# Patient Record
Sex: Female | Born: 1950 | Race: White | Hispanic: No | Marital: Married | Smoking: Never smoker
Health system: Southern US, Community
[De-identification: ages and names within clinical notes are randomized; demographics above are authoritative.]

## PROBLEM LIST (undated history)

## (undated) DIAGNOSIS — Z7189 Other specified counseling: Principal | ICD-10-CM

## (undated) DIAGNOSIS — M1990 Unspecified osteoarthritis, unspecified site: Secondary | ICD-10-CM

## (undated) DIAGNOSIS — J069 Acute upper respiratory infection, unspecified: Secondary | ICD-10-CM

## (undated) DIAGNOSIS — C3492 Malignant neoplasm of unspecified part of left bronchus or lung: Secondary | ICD-10-CM

## (undated) DIAGNOSIS — R319 Hematuria, unspecified: Secondary | ICD-10-CM

## (undated) DIAGNOSIS — Z789 Other specified health status: Secondary | ICD-10-CM

## (undated) DIAGNOSIS — R5382 Chronic fatigue, unspecified: Secondary | ICD-10-CM

## (undated) DIAGNOSIS — J329 Chronic sinusitis, unspecified: Secondary | ICD-10-CM

## (undated) DIAGNOSIS — Z5111 Encounter for antineoplastic chemotherapy: Secondary | ICD-10-CM

## (undated) DIAGNOSIS — E041 Nontoxic single thyroid nodule: Secondary | ICD-10-CM

## (undated) DIAGNOSIS — J45909 Unspecified asthma, uncomplicated: Secondary | ICD-10-CM

## (undated) DIAGNOSIS — K439 Ventral hernia without obstruction or gangrene: Secondary | ICD-10-CM

## (undated) DIAGNOSIS — K219 Gastro-esophageal reflux disease without esophagitis: Secondary | ICD-10-CM

## (undated) HISTORY — DX: Chronic fatigue, unspecified: R53.82

## (undated) HISTORY — DX: Malignant neoplasm of unspecified part of left bronchus or lung: C34.92

## (undated) HISTORY — DX: Unspecified asthma, uncomplicated: J45.909

## (undated) HISTORY — DX: Other specified counseling: Z71.89

## (undated) HISTORY — DX: Encounter for antineoplastic chemotherapy: Z51.11

## (undated) HISTORY — PX: BREAST SURGERY: SHX581

## (undated) HISTORY — DX: Chronic sinusitis, unspecified: J32.9

## (undated) HISTORY — PX: EYE SURGERY: SHX253

---

## 1986-11-14 HISTORY — PX: TUBAL LIGATION: SHX77

## 1994-11-14 HISTORY — PX: BREAST CYST EXCISION: SHX579

## 1998-12-07 ENCOUNTER — Other Ambulatory Visit: Admission: RE | Admit: 1998-12-07 | Discharge: 1998-12-07 | Payer: Self-pay | Admitting: Internal Medicine

## 2000-01-20 ENCOUNTER — Other Ambulatory Visit: Admission: RE | Admit: 2000-01-20 | Discharge: 2000-01-20 | Payer: Self-pay | Admitting: Internal Medicine

## 2001-01-23 ENCOUNTER — Other Ambulatory Visit: Admission: RE | Admit: 2001-01-23 | Discharge: 2001-01-23 | Payer: Self-pay | Admitting: Internal Medicine

## 2001-01-26 ENCOUNTER — Encounter: Admission: RE | Admit: 2001-01-26 | Discharge: 2001-01-26 | Payer: Self-pay | Admitting: Internal Medicine

## 2001-01-26 ENCOUNTER — Encounter: Payer: Self-pay | Admitting: Internal Medicine

## 2001-03-13 ENCOUNTER — Encounter (INDEPENDENT_AMBULATORY_CARE_PROVIDER_SITE_OTHER): Payer: Self-pay | Admitting: Specialist

## 2001-03-13 ENCOUNTER — Other Ambulatory Visit: Admission: RE | Admit: 2001-03-13 | Discharge: 2001-03-13 | Payer: Self-pay | Admitting: Obstetrics and Gynecology

## 2001-06-18 ENCOUNTER — Encounter: Admission: RE | Admit: 2001-06-18 | Discharge: 2001-06-18 | Payer: Self-pay | Admitting: Internal Medicine

## 2001-06-18 ENCOUNTER — Encounter: Payer: Self-pay | Admitting: Internal Medicine

## 2001-07-04 ENCOUNTER — Encounter: Admission: RE | Admit: 2001-07-04 | Discharge: 2001-08-07 | Payer: Self-pay | Admitting: Neurosurgery

## 2001-07-17 ENCOUNTER — Ambulatory Visit (HOSPITAL_COMMUNITY): Admission: RE | Admit: 2001-07-17 | Discharge: 2001-07-17 | Payer: Self-pay | Admitting: *Deleted

## 2001-07-17 ENCOUNTER — Encounter (INDEPENDENT_AMBULATORY_CARE_PROVIDER_SITE_OTHER): Payer: Self-pay | Admitting: Specialist

## 2001-11-14 HISTORY — PX: DILATION AND CURETTAGE OF UTERUS: SHX78

## 2002-02-05 ENCOUNTER — Other Ambulatory Visit: Admission: RE | Admit: 2002-02-05 | Discharge: 2002-02-05 | Payer: Self-pay | Admitting: Internal Medicine

## 2002-02-19 ENCOUNTER — Encounter: Admission: RE | Admit: 2002-02-19 | Discharge: 2002-02-19 | Payer: Self-pay | Admitting: Internal Medicine

## 2002-02-19 ENCOUNTER — Encounter: Payer: Self-pay | Admitting: Internal Medicine

## 2002-04-17 ENCOUNTER — Encounter (INDEPENDENT_AMBULATORY_CARE_PROVIDER_SITE_OTHER): Payer: Self-pay

## 2002-04-17 ENCOUNTER — Ambulatory Visit (HOSPITAL_COMMUNITY): Admission: RE | Admit: 2002-04-17 | Discharge: 2002-04-17 | Payer: Self-pay | Admitting: Obstetrics and Gynecology

## 2003-02-24 ENCOUNTER — Other Ambulatory Visit: Admission: RE | Admit: 2003-02-24 | Discharge: 2003-02-24 | Payer: Self-pay | Admitting: Internal Medicine

## 2003-11-15 HISTORY — PX: CARPAL TUNNEL RELEASE: SHX101

## 2004-03-11 ENCOUNTER — Other Ambulatory Visit: Admission: RE | Admit: 2004-03-11 | Discharge: 2004-03-11 | Payer: Self-pay | Admitting: Internal Medicine

## 2005-03-18 ENCOUNTER — Other Ambulatory Visit: Admission: RE | Admit: 2005-03-18 | Discharge: 2005-03-18 | Payer: Self-pay | Admitting: Internal Medicine

## 2006-03-30 ENCOUNTER — Other Ambulatory Visit: Admission: RE | Admit: 2006-03-30 | Discharge: 2006-03-30 | Payer: Self-pay | Admitting: Internal Medicine

## 2006-11-14 HISTORY — PX: ABDOMINAL HYSTERECTOMY: SHX81

## 2007-04-03 ENCOUNTER — Other Ambulatory Visit: Admission: RE | Admit: 2007-04-03 | Discharge: 2007-04-03 | Payer: Self-pay | Admitting: Internal Medicine

## 2007-11-02 ENCOUNTER — Ambulatory Visit (HOSPITAL_COMMUNITY): Admission: RE | Admit: 2007-11-02 | Discharge: 2007-11-03 | Payer: Self-pay | Admitting: Obstetrics and Gynecology

## 2007-11-02 ENCOUNTER — Encounter (INDEPENDENT_AMBULATORY_CARE_PROVIDER_SITE_OTHER): Payer: Self-pay | Admitting: Obstetrics and Gynecology

## 2008-04-14 ENCOUNTER — Other Ambulatory Visit: Admission: RE | Admit: 2008-04-14 | Discharge: 2008-04-14 | Payer: Self-pay | Admitting: Internal Medicine

## 2010-02-12 ENCOUNTER — Encounter: Admission: RE | Admit: 2010-02-12 | Discharge: 2010-02-12 | Payer: Self-pay | Admitting: Internal Medicine

## 2010-09-30 ENCOUNTER — Encounter: Admission: RE | Admit: 2010-09-30 | Discharge: 2010-09-30 | Payer: Self-pay | Admitting: Internal Medicine

## 2010-12-04 ENCOUNTER — Other Ambulatory Visit: Payer: Self-pay | Admitting: Internal Medicine

## 2010-12-04 DIAGNOSIS — E041 Nontoxic single thyroid nodule: Secondary | ICD-10-CM

## 2011-03-29 NOTE — Op Note (Signed)
Karen Vasquez, Karen Vasquez                 ACCOUNT NO.:  [id number]   MEDICAL RECORD NO.:  [id number]          PATIENT TYPE:  OIB   LOCATION:  9306                          FACILITY:  [facility]   PHYSICIAN:  Maretta Bees. Vonita Moss, M.D.DATE OF BIRTH:  May 30, 1951   DATE OF PROCEDURE:  DATE OF DISCHARGE:                               OPERATIVE REPORT   PREOPERATIVE DIAGNOSIS:  Stress urinary incontinence and hematuria.   POSTOPERATIVE DIAGNOSIS:  Stress urinary incontinence and hematuria.   PROCEDURE:  Obturator sling insertion and cystoscopy.   SURGEON:  Maretta Bees. Vonita Moss, MD   ANESTHESIA:  General.   INDICATIONS:  This lady has had stress incontinence and an urodynamic  study was consistent with a stable bladder and significant stress  incontinence.  She also has had microhematuria and a renal ultrasound  showed right renal cyst, but no other abnormalities.  She needed a  cystoscopy and I felt that could be done in conjunction with the sling  insertion.  She was counseled about the risk of erosion, infection, and  persistent incontinence or bladder injury, or retention.   PROCEDURE:  After Dr. Rosalio Macadamia completed her vaginal hysterectomy and  anterior and posterior repair, I scrubbed into the case.  Foley catheter  was in position and I dissected out the suburethral area up toward the  endopelvic fascia bilaterally.  I then performed puncture wounds at the  level of the clitoris and over each obturator fossa.  I then used the  curved Obturyx needle passers to march the needle behind the obturator  bone after assuring that the bladder was empty.  I then perforated the  endopelvic fascia and brought out the needle passer in mid urethral area  with no button holing of the vaginal mucosa.  I then brought up the left  side of the sling and then in like fashion passed the needle on the  right side and brought it up mid urethra and attached the sling and  brought up the right side of the sling in good  position.  I then  cystoscoped and there was no evidence of bladder tumors or any other  bladder lesions.  There was no evidence of bladder injury and the  ureteral orifices and bladder neck were well away from our sling  placement in mid urethra.  I then reinserted a Foley catheter and  snugged the sling up into the mid urethral region, and I was able to  place a hemostat between the sling and the urethra without any undue  tension.  The ends of the sling were cut off at the skin level and they  retracted submucosally.  Those wounds were later closed with  Dermabond.  At this point I ascertained that the sling was in good  position, mid urethra with at least 1.5 cm distance between the bladder  neck and the sling.  The wounds were irrigated with antibiotic solution,  and then I closed the vaginal mucosa with 2-0 Vicryl.      Maretta Bees. Vonita Moss, M.D.  Electronically Signed     LJP/MEDQ  D:  11/02/2007  T:  11/02/2007  Job:  [id number]   cc:   Sherry A. Rosalio Macadamia, M.D.  Fax: [phone]

## 2011-03-29 NOTE — Op Note (Signed)
Karen Vasquez, Karen Vasquez                 ACCOUNT NO.:  [id number]   MEDICAL RECORD NO.:  [id number]          PATIENT TYPE:  AMB   LOCATION:  SDC                           FACILITY:  [facility]   PHYSICIAN:  Sherry A. Dickstein, M.D.DATE OF BIRTH:  1951/09/11   DATE OF PROCEDURE:  11/02/2007  DATE OF DISCHARGE:                               OPERATIVE REPORT   PREOPERATIVE DIAGNOSIS:  Vaginal prolapse, stress urinary incontinence.   POSTOPERATIVE DIAGNOSIS:  Vaginal prolapse, stress urinary incontinence.   PROCEDURE:  Vaginal hysterectomy, anterior and posterior repair, and  obturator sling procedure by Dr. Vonita Moss, dictated separately.   SURGEONS:  Dr. Rosalio Macadamia, Dr. Billy Coast, Dr. Vonita Moss.   ANESTHESIA:  Epidural.   INDICATIONS:  This is a 60 year old G3, P3-0-0-3 woman who has had  significant vaginal prolapse.  Patient has complained of severe  pressure, difficulty moving her bowels, and vaginal discomfort.  The  patient has stress urinary incontinence.  Patient has requested to have  the surgery performed at the same time.  Because of all of these  problems, patient is brought to the operating room for vaginal  hysterectomy, anterior and posterior repair, and a sling procedure by  Dr. Vonita Moss.   FINDINGS:  Third degree cervical prolapse.  Normal sized uterus.  Third  degree rectocele, second degree cystocele.   PROCEDURE:  Patient is brought into the operating room and given  adequate epidural anesthesia.  She is placed in a dorsal lithotomy  position.  The suprapubic area, perineum, vagina were washed with  Betadine.  She was draped in a sterile fashion.  The Foley catheter was  placed in the bladder.  The cervix was grasped with Christella Hartigan tenaculum.  The cervix was infiltrated with 1% Xylocaine with epinephrine  circumferentially.  The cervix was circumcised with cautery.  The  cervical mucosa was dissected off of the cervix with blunt dissection.  The posterior cul de sac was entered  sharply.  Uterosacral ligaments  were clamped, cut, and suture ligated.  The anterior cul de sac was  identified and entered sharply.  The posterior cuff was closed with 0  Vicryl in a running, locked stitch.  On alternating sides along the  cardinal ligaments, these were cauterized with Ligasure and cut.  The  uterine arteries were cauterized x2-3 before being cut.  The ligaments  and vessels were cauterized until the utero-ovarian ligaments were  identified.  These were cauterized and cut such that the specimen was  removed.  There were small bleeders that were cauterized.  Once adequate  hemostasis was present, the peritoneum was approximated to the vaginal  mucosa laterally with 0 Vicryl figure-of-eight stitches.  Adequate  hemostasis was present.  Using 0 Vicryl sutures, posterior plication  stitches were taken from the uterosacral ligaments superficially to  prevent future enteroceles x2.  Then the vaginal cuff was closed using 0  Vicryl and figure-of-eight interrupted stitches.  This was closed up to  approximately 1 cm of the anterior portion of the vagina.  The vaginal  mucosa was identified, and it was dissected free off of the bladder up  to approximately 2 cm of the urethra.  The vaginal mucosa was then  dissected laterally on both sides.  Bleeders were cauterized.  The  endopelvic fascia was then identified, and using 2-0 Vicryl with Kelly  plication stitches, the endopelvic fascia was brought across the bladder  for support.  The remainder of the bladder was left open in this  fashion.  The rest of the cystocele was not closed to leave space for  Dr. Vonita Moss to perform his surgery.  The rectocele was then attended  to.  The perineum, a V-shaped incision was taken, and the superficial  mucosa was dissected off.  The vaginal mucosa was dissected off of the  rectum over approximately 5 cm, and the vaginal mucosa was dissected  laterally.  Bleeders were cauterized.  Using 2-0  Vicryl in a purse-  string-type stitch, the endopelvic fascia  was pulled across the rectum  for support.  Again, bleeders were cauterized.  The excess vaginal  mucosa was dissected free, and the vaginal mucosa was closed using 3-0  chromic in a running locked stitch.  The levator ani muscles were then  approximated using 0 Vicryl and figure-of-eight stitches.  Adequate  hemostasis was present.  The perineum was then closed with 3-0 chromic  in a subcutaneous and then subcuticular running stitch.  Hemostasis was  present.  The vaginal mucosa was left opened at the cystocele for Dr.  Vonita Moss, who at this point came in to finish his surgery.  The uterus  and cervix were sent to pathology.      Sherry A. Rosalio Macadamia, M.D.  Electronically Signed     SAD/MEDQ  D:  11/02/2007  T:  11/02/2007  Job:  [id number]

## 2011-04-01 NOTE — Procedures (Signed)
[facility]  Patient:    Karen Vasquez, Karen Vasquez Visit Number: [id number] MRN: [id number]          Service Type: END Location: ENDO Attending Physician:  Sabino Gasser Dictated by:   Sabino Gasser, M.D. Proc. Date: 07/17/01 Admit Date:  07/17/2001                             Procedure Report  PROCEDURE PERFORMED:  Upper endoscopy.  ENDOSCOPIST:  Sabino Gasser, M.D.  INDICATIONS FOR PROCEDURE:  Reflux.  ANESTHESIA:  Demerol 50 mg, Versed 6 mg.  DESCRIPTION OF PROCEDURE:  With the patient mildly sedated in the left lateral decubitus position, the Olympus video endoscope was inserted in the mouth and passed under direct vision through the esophagus.  The distal esophagus showed changes of a hiatal hernia.  There was a question of a hiatal hernia.  There was a question of a very small segment of Barretts esophagus photographed and biopsied.  We entered into the stomach.  The fundus, body, antrum, duodenal bulb and second portion of the duodenum were visualized.  From this point, the endoscope was slowly withdrawn taking circumferential views of the entire duodenal mucosa until the endoscope had been pulled back into the stomach and placed on retroflexion to view the stomach from below and a hiatal hernia was seen.  The endoscope was then straightened and withdrawn taking circumferential views of the entire gastric mucosa and esophageal mucosa, stopping to biopsy gastric mucosa.  Patients vital signs and pulse oximeter remained stable.  The patient tolerated the procedure well without apparent complications.  FINDINGS:  Diffuse erythema of stomach consistent with gastritis.  Await biopsy report.  Patient will call me for results and follow up with me as an outpatient.  Also biopsy of distal esophagus.  Again await results.  Patient will call me for results.  Proceed to colonoscopy as planned. Dictated by:   Sabino Gasser, M.D. Attending Physician:  Sabino Gasser DD:   07/17/01 TD:  07/17/01 Job: [id number]

## 2011-04-01 NOTE — Op Note (Signed)
[facility]  Patient:    Karen Vasquez, Karen Vasquez Visit Number: [id number] MRN: [id number]          Service Type: DSU Location: [facility] Attending Physician:  Morene Antu Dictated by:   Sherry A. Rosalio Macadamia, M.D. Proc. Date: 04/17/02 Admit Date:  04/17/2002 Discharge Date: 04/17/2002                             Operative Report  PREOPERATIVE DIAGNOSES:       1. Postmenopausal bleeding.                               2. Thickened endometrium.  POSTOPERATIVE DIAGNOSES:      1. Postmenopausal bleeding.                               2. Thickened endometrium.                               3. Endometrial polyps.  OPERATION:                    Dilatation and curettage hysteroscopy with resectoscope.  SURGEON:                      Sherry A. Rosalio Macadamia, M.D.  ANESTHESIA:                   MAC  INDICATIONS:                  This is a 60 year old G3, P3, 0-0-3 woman who has postmenopausal bleeding. Her bleeding has been nearly daily for the past two months. The patient is currently on a CombiPatch. The patient had an ultrasound which revealed thickened endometrial tissue and therefore, the patient is brought to the operating room for [facility] hysteroscopy with resectoscope.  FINDINGS:                     Normal size anteflexed uterus, no adnexal mass, multiple endometrial polyps, and thickened tissue.  DESCRIPTION OF PROCEDURE:     The patient is brought to the operating room where she was given adequate IV sedation. She was placed in the dorsal lithotomy position, her perineum and vagina washed with Hibiclens. Bladder was in-and-out catheterized. Pelvic exam was performed, patient was draped in sterile fashion. Speculum was placed into the vagina. Paracervical block was administered with 1% Nesacaine. Anterior cervix was grasped with a single-tooth tenaculum. Cervix was sounded, cervix was dilated with the [facility] dilators to a #31. Hysteroscope was then  introduced into the endometrial cavity. Pictures were obtained. Using a double-loop right-angle resector, the endometrial polyps were removed then resections were taken circumferentially of all the thickened endometrium. Bleeders were cauterized. Pictures were obtained. The patient was then inspected. Adequate hemostasis was present. All instruments removed from the vagina. The patient was taken out of the dorsal lithotomy position. She was awakened, she was moved from the operating table to a stretcher in stable condition. Complications were none, estimated blood loss less than 5 cc. Sorbitol differential -30 cc. Dictated by:   Sherry A. Rosalio Macadamia, M.D. Attending Physician:  Morene Antu DD:  04/17/02 TD:  04/19/02 Job: [id number]

## 2011-04-01 NOTE — Procedures (Signed)
[facility]  Patient:    Karen Vasquez, Karen Vasquez Visit Number: [id number] MRN: [id number]          Service Type: END Location: ENDO Attending Physician:  Sabino Gasser Dictated by:   Sabino Gasser, M.D. Proc. Date: 07/17/01 Admit Date:  07/17/2001                             Procedure Report  PROCEDURE PERFORMED:  Colonoscopy.  ENDOSCOPIST:  Sabino Gasser, M.D.  INDICATIONS FOR PROCEDURE:  Colon cancer screening.  ANESTHESIA:  Demerol 30 mg, Versed 1 extra mg.  DESCRIPTION OF PROCEDURE:  With the patient mildly sedated in the left lateral decubitus position, the Olympus videoscopic variable stiffness colonoscope was inserted in the rectum and passed under direct vision into the cecum.  The cecum was identified by the ileocecal valve and appendiceal orifice, both of which were photographed.  From this point, the colonoscope was slowly withdrawn, taking circumferential views of the entire colonic mucosa stopping in the rectum only which appeared normal on direct view and showed questionable internal hemorrhoids.  The patients vital signs and pulse oximeter remained stable.  The patient tolerated the procedure well and without apparent complications.  FINDINGS:  Question internal hemorrhoids.  Otherwise unremarkable examination.  PLAN:  Repeat examination in five years.  See endoscopy note for further details. Dictated by:   Sabino Gasser, M.D. Attending Physician:  Sabino Gasser DD:  07/17/01 TD:  07/17/01 Job: [id number]

## 2011-04-18 ENCOUNTER — Other Ambulatory Visit: Payer: Self-pay

## 2011-04-18 ENCOUNTER — Ambulatory Visit
Admission: RE | Admit: 2011-04-18 | Discharge: 2011-04-18 | Disposition: A | Discharge status: Home / Self Care | Payer: BC Managed Care – PPO | Source: Ambulatory Visit | Attending: Internal Medicine | Admitting: Internal Medicine

## 2011-04-18 DIAGNOSIS — E041 Nontoxic single thyroid nodule: Secondary | ICD-10-CM

## 2011-08-19 LAB — URINE MICROSCOPIC-ADD ON

## 2011-08-19 LAB — CBC
HCT: 40.8
Hemoglobin: 14.1
MCHC: 34.3
MCV: 90
Platelets: 246
RBC: 4.57
RDW: 14
RDW: 14.2

## 2011-08-19 LAB — COMPREHENSIVE METABOLIC PANEL
ALT: 29
Alkaline Phosphatase: 69
BUN: 10
CO2: 28
Chloride: 101
GFR calc non Af Amer: >60
Glucose, Bld: 103 — ABNORMAL HIGH
Potassium: 3.6
Total Bilirubin: 0.5
Total Protein: 7.6

## 2011-08-19 LAB — URINALYSIS, ROUTINE W REFLEX MICROSCOPIC
Nitrite: NEGATIVE
Protein, ur: NEGATIVE
Specific Gravity, Urine: 1.015
Urobilinogen, UA: 0.2

## 2011-08-19 LAB — DIFFERENTIAL
Basophils Absolute: 0
Basophils Relative: 1
Eosinophils Absolute: 0.1 — ABNORMAL LOW
Neutro Abs: 3.8
Neutrophils Relative %: 50

## 2012-03-15 ENCOUNTER — Other Ambulatory Visit: Payer: Self-pay | Admitting: Neurological Surgery

## 2012-04-17 ENCOUNTER — Encounter (HOSPITAL_COMMUNITY): Payer: Self-pay

## 2012-04-17 ENCOUNTER — Inpatient Hospital Stay (HOSPITAL_COMMUNITY): Admission: RE | Admit: 2012-04-17 | Discharge: 2012-04-17 | Payer: BC Managed Care – PPO | Source: Ambulatory Visit

## 2012-04-17 ENCOUNTER — Encounter (HOSPITAL_COMMUNITY): Payer: Self-pay | Admitting: Pharmacy Technician

## 2012-04-17 HISTORY — DX: Acute upper respiratory infection, unspecified: J06.9

## 2012-04-17 HISTORY — DX: Ventral hernia without obstruction or gangrene: K43.9

## 2012-04-17 HISTORY — DX: Unspecified osteoarthritis, unspecified site: M19.90

## 2012-04-17 HISTORY — DX: Hematuria, unspecified: R31.9

## 2012-04-17 HISTORY — DX: Nontoxic single thyroid nodule: E04.1

## 2012-04-17 HISTORY — DX: Gastro-esophageal reflux disease without esophagitis: K21.9

## 2012-04-17 NOTE — Pre-Procedure Instructions (Signed)
20 Karen Vasquez   04/17/2012   Your procedure is scheduled on:  Wednesday, June 26th  Report to [facility] at   6:30 AM.  Call this number if you have problems the morning of surgery: [phone]   Remember:   Do not eat food:After Midnight Tuesday.  May have clear liquids: up to 4 Hours before arrival time -- 2:30 AM.  Clear liquids include soda, tea, black coffee, apple or grape juice, broth.   Take these medicines the morning of surgery with A SIP OF WATER: dexilant, claritin, nasal sprays, evista STOP aspirin, fish oil 05/02/12   Do not wear jewelry, make-up or nail polish.  Do not wear lotions, powders, or perfumes. You may wear deodorant.  Do not shave 48 hours prior to surgery. Men may shave face and neck.   Do not bring valuables to the hospital.  Contacts, dentures or bridgework may not be worn into surgery.  Leave suitcase in the car. After surgery it may be brought to your room.  For patients admitted to the hospital, checkout time is 11:00 AM the day of discharge.   Patients discharged the day of surgery will not be allowed to drive home.  Name and phone number of your driver:thomas [phone]  Special Instructions: CHG Shower Use Special Wash: 1/2 bottle night before surgery and 1/2 bottle morning of surgery.   Please read over the following fact sheets that you were given: Pain Booklet, MRSA Information and Surgical Site Infection Prevention

## 2012-05-01 ENCOUNTER — Encounter (HOSPITAL_COMMUNITY): Payer: Self-pay

## 2012-05-01 ENCOUNTER — Encounter (HOSPITAL_COMMUNITY)
Admission: RE | Admit: 2012-05-01 | Discharge: 2012-05-01 | Disposition: A | Discharge status: Home / Self Care | Payer: BC Managed Care – PPO | Source: Ambulatory Visit | Attending: Neurological Surgery | Admitting: Neurological Surgery

## 2012-05-01 HISTORY — DX: Other specified health status: Z78.9

## 2012-05-01 LAB — BASIC METABOLIC PANEL
Calcium: 9.6 mg/dL (ref 8.4–10.5)
GFR calc Af Amer: >90 mL/min (ref >90)
GFR calc non Af Amer: >90 mL/min (ref >90)
Glucose, Bld: 127 mg/dL — ABNORMAL HIGH (ref 70–99)
Sodium: 140 mEq/L (ref 135–145)

## 2012-05-01 LAB — SURGICAL PCR SCREEN: Staphylococcus aureus: NEGATIVE

## 2012-05-01 LAB — PROTIME-INR
INR: 1 (ref 0.00–1.49)
Prothrombin Time: 13.4 seconds (ref 11.6–15.2)

## 2012-05-01 LAB — CBC
MCH: 29.5 pg (ref 26.0–34.0)
Platelets: 256 10*3/uL (ref 150–400)
RBC: 4.61 MIL/uL (ref 3.87–5.11)

## 2012-05-08 MED ORDER — CEFAZOLIN SODIUM 1-5 GM-% IV SOLN
1.0000 g | INTRAVENOUS | Status: AC
  Administered 2012-05-09: 1 g via INTRAVENOUS
  Filled 2012-05-08 (×2): qty 50

## 2012-05-09 ENCOUNTER — Ambulatory Visit (HOSPITAL_COMMUNITY): Payer: BC Managed Care – PPO | Admitting: Anesthesiology

## 2012-05-09 ENCOUNTER — Encounter (HOSPITAL_COMMUNITY): Payer: Self-pay | Admitting: Anesthesiology

## 2012-05-09 ENCOUNTER — Ambulatory Visit (HOSPITAL_COMMUNITY)
Admission: RE | Admit: 2012-05-09 | Discharge: 2012-05-10 | Disposition: A | Discharge status: Home / Self Care | Payer: BC Managed Care – PPO | Source: Ambulatory Visit | Attending: Neurological Surgery | Admitting: Neurological Surgery

## 2012-05-09 ENCOUNTER — Encounter (HOSPITAL_COMMUNITY)
Admission: RE | Disposition: A | Discharge status: Home / Self Care | Payer: Self-pay | Source: Ambulatory Visit | Attending: Neurological Surgery

## 2012-05-09 ENCOUNTER — Ambulatory Visit (HOSPITAL_COMMUNITY): Payer: BC Managed Care – PPO

## 2012-05-09 ENCOUNTER — Encounter (HOSPITAL_COMMUNITY): Payer: Self-pay | Admitting: Neurological Surgery

## 2012-05-09 DIAGNOSIS — M47812 Spondylosis without myelopathy or radiculopathy, cervical region: Secondary | ICD-10-CM | POA: Insufficient documentation

## 2012-05-09 DIAGNOSIS — Z01812 Encounter for preprocedural laboratory examination: Secondary | ICD-10-CM | POA: Insufficient documentation

## 2012-05-09 DIAGNOSIS — Z01810 Encounter for preprocedural cardiovascular examination: Secondary | ICD-10-CM | POA: Insufficient documentation

## 2012-05-09 DIAGNOSIS — Z981 Arthrodesis status: Secondary | ICD-10-CM

## 2012-05-09 DIAGNOSIS — K219 Gastro-esophageal reflux disease without esophagitis: Secondary | ICD-10-CM | POA: Insufficient documentation

## 2012-05-09 DIAGNOSIS — Z01818 Encounter for other preprocedural examination: Secondary | ICD-10-CM | POA: Insufficient documentation

## 2012-05-09 HISTORY — PX: ANTERIOR CERVICAL DECOMP/DISCECTOMY FUSION: SHX1161

## 2012-05-09 SURGERY — ANTERIOR CERVICAL DECOMPRESSION/DISCECTOMY FUSION 1 LEVEL
Anesthesia: General

## 2012-05-09 MED ORDER — ROCURONIUM BROMIDE 100 MG/10ML IV SOLN
INTRAVENOUS | Status: DC | PRN
  Administered 2012-05-09 (×2): 10 mg via INTRAVENOUS
  Administered 2012-05-09: 50 mg via INTRAVENOUS

## 2012-05-09 MED ORDER — THROMBIN 5000 UNITS EX SOLR
CUTANEOUS | Status: DC | PRN
  Administered 2012-05-09 (×3): 5000 [IU] via TOPICAL

## 2012-05-09 MED ORDER — BUPIVACAINE HCL (PF) 0.25 % IJ SOLN
INTRAMUSCULAR | Status: DC | PRN
  Administered 2012-05-09: 4 mL

## 2012-05-09 MED ORDER — PANTOPRAZOLE SODIUM 40 MG PO TBEC: 40.0000 mg | DELAYED_RELEASE_TABLET | Freq: Every day | ORAL | Status: DC

## 2012-05-09 MED ORDER — SODIUM CHLORIDE 0.9 % IJ SOLN: 3.0000 mL | Freq: Two times a day (BID) | INTRAMUSCULAR | Status: DC

## 2012-05-09 MED ORDER — MENTHOL 3 MG MT LOZG
1.0000 | LOZENGE | OROMUCOSAL | Status: DC | PRN
  Filled 2012-05-09: qty 9

## 2012-05-09 MED ORDER — CEFAZOLIN SODIUM 1-5 GM-% IV SOLN
1.0000 g | Freq: Three times a day (TID) | INTRAVENOUS | Status: AC
  Administered 2012-05-09 – 2012-05-10 (×2): 1 g via INTRAVENOUS
  Filled 2012-05-09 (×2): qty 50

## 2012-05-09 MED ORDER — HEMOSTATIC AGENTS (NO CHARGE) OPTIME
TOPICAL | Status: DC | PRN
  Administered 2012-05-09: 1 via TOPICAL

## 2012-05-09 MED ORDER — 0.9 % SODIUM CHLORIDE (POUR BTL) OPTIME
TOPICAL | Status: DC | PRN
  Administered 2012-05-09: 1000 mL

## 2012-05-09 MED ORDER — ONDANSETRON HCL 4 MG/2ML IJ SOLN
INTRAMUSCULAR | Status: DC | PRN
  Administered 2012-05-09: 4 mg via INTRAVENOUS

## 2012-05-09 MED ORDER — ONDANSETRON HCL 4 MG/2ML IJ SOLN: 4.0000 mg | Freq: Once | INTRAMUSCULAR | Status: DC | PRN

## 2012-05-09 MED ORDER — HYDROMORPHONE HCL PF 1 MG/ML IJ SOLN
0.2500 mg | INTRAMUSCULAR | Status: DC | PRN
  Administered 2012-05-09 (×2): 0.5 mg via INTRAVENOUS

## 2012-05-09 MED ORDER — POTASSIUM CHLORIDE IN NACL 20-0.9 MEQ/L-% IV SOLN
INTRAVENOUS | Status: DC
  Administered 2012-05-09: 14:00:00 via INTRAVENOUS
  Filled 2012-05-09 (×4): qty 1000

## 2012-05-09 MED ORDER — SODIUM CHLORIDE 0.9 % IV SOLN
INTRAVENOUS | Status: AC
  Filled 2012-05-09: qty 500

## 2012-05-09 MED ORDER — ACETAMINOPHEN 650 MG RE SUPP: 650.0000 mg | RECTAL | Status: DC | PRN

## 2012-05-09 MED ORDER — HYDROCODONE-ACETAMINOPHEN 5-325 MG PO TABS
1.0000 | ORAL_TABLET | ORAL | Status: DC | PRN
  Administered 2012-05-09 – 2012-05-10 (×4): 2 via ORAL
  Filled 2012-05-09 (×4): qty 2

## 2012-05-09 MED ORDER — SODIUM CHLORIDE 0.9 % IR SOLN
Status: DC | PRN
  Administered 2012-05-09: 09:00:00

## 2012-05-09 MED ORDER — RALOXIFENE HCL 60 MG PO TABS
60.0000 mg | ORAL_TABLET | Freq: Every day | ORAL | Status: DC
  Filled 2012-05-09: qty 1

## 2012-05-09 MED ORDER — LACTATED RINGERS IV SOLN
INTRAVENOUS | Status: DC | PRN
  Administered 2012-05-09: 08:00:00 via INTRAVENOUS

## 2012-05-09 MED ORDER — PHENYLEPHRINE HCL 10 MG/ML IJ SOLN
INTRAMUSCULAR | Status: DC | PRN
  Administered 2012-05-09 (×2): 40 ug via INTRAVENOUS
  Administered 2012-05-09: 80 ug via INTRAVENOUS
  Administered 2012-05-09: 40 ug via INTRAVENOUS

## 2012-05-09 MED ORDER — FENTANYL CITRATE 0.05 MG/ML IJ SOLN
INTRAMUSCULAR | Status: DC | PRN
  Administered 2012-05-09: 100 ug via INTRAVENOUS
  Administered 2012-05-09: 50 ug via INTRAVENOUS

## 2012-05-09 MED ORDER — METHOCARBAMOL 100 MG/ML IJ SOLN
500.0000 mg | Freq: Four times a day (QID) | INTRAVENOUS | Status: DC | PRN
  Administered 2012-05-09: 500 mg via INTRAVENOUS
  Filled 2012-05-09: qty 5

## 2012-05-09 MED ORDER — SENNA 8.6 MG PO TABS
1.0000 | ORAL_TABLET | Freq: Two times a day (BID) | ORAL | Status: DC
  Administered 2012-05-09: 8.6 mg via ORAL
  Filled 2012-05-09 (×3): qty 1

## 2012-05-09 MED ORDER — GLYCOPYRROLATE 0.2 MG/ML IJ SOLN
INTRAMUSCULAR | Status: DC | PRN
  Administered 2012-05-09: .6 mg via INTRAVENOUS

## 2012-05-09 MED ORDER — LIDOCAINE HCL (CARDIAC) 20 MG/ML IV SOLN
INTRAVENOUS | Status: DC | PRN
  Administered 2012-05-09: 60 mg via INTRAVENOUS

## 2012-05-09 MED ORDER — METHOCARBAMOL 500 MG PO TABS
500.0000 mg | ORAL_TABLET | Freq: Four times a day (QID) | ORAL | Status: DC | PRN
  Administered 2012-05-09 – 2012-05-10 (×2): 500 mg via ORAL
  Filled 2012-05-09 (×3): qty 1

## 2012-05-09 MED ORDER — MIDAZOLAM HCL 5 MG/5ML IJ SOLN
INTRAMUSCULAR | Status: DC | PRN
  Administered 2012-05-09: 2 mg via INTRAVENOUS

## 2012-05-09 MED ORDER — NEOSTIGMINE METHYLSULFATE 1 MG/ML IJ SOLN
INTRAMUSCULAR | Status: DC | PRN
  Administered 2012-05-09: 4 mg via INTRAVENOUS

## 2012-05-09 MED ORDER — HYDROMORPHONE HCL PF 1 MG/ML IJ SOLN
INTRAMUSCULAR | Status: AC
  Administered 2012-05-09: 0.5 mg via INTRAVENOUS
  Filled 2012-05-09: qty 1

## 2012-05-09 MED ORDER — ACETAMINOPHEN 325 MG PO TABS: 650.0000 mg | ORAL_TABLET | ORAL | Status: DC | PRN

## 2012-05-09 MED ORDER — TRAZODONE HCL 50 MG PO TABS
50.0000 mg | ORAL_TABLET | Freq: Every day | ORAL | Status: DC
  Administered 2012-05-09: 50 mg via ORAL
  Filled 2012-05-09 (×2): qty 1

## 2012-05-09 MED ORDER — PROPOFOL 10 MG/ML IV EMUL
INTRAVENOUS | Status: DC | PRN
  Administered 2012-05-09: 150 mg via INTRAVENOUS

## 2012-05-09 MED ORDER — BACITRACIN 50000 UNITS IM SOLR
INTRAMUSCULAR | Status: AC
  Filled 2012-05-09: qty 1

## 2012-05-09 MED ORDER — PHENOL 1.4 % MT LIQD: 1.0000 | OROMUCOSAL | Status: DC | PRN

## 2012-05-09 MED ORDER — HYDROMORPHONE HCL PF 1 MG/ML IJ SOLN
0.5000 mg | INTRAMUSCULAR | Status: DC | PRN
  Administered 2012-05-09: 1 mg via INTRAVENOUS
  Filled 2012-05-09: qty 1

## 2012-05-09 MED ORDER — KETOTIFEN FUMARATE 0.025 % OP SOLN: 1.0000 [drp] | Freq: Two times a day (BID) | OPHTHALMIC | Status: DC

## 2012-05-09 MED ORDER — ONDANSETRON HCL 4 MG/2ML IJ SOLN: 4.0000 mg | INTRAMUSCULAR | Status: DC | PRN

## 2012-05-09 MED ORDER — SODIUM CHLORIDE 0.9 % IJ SOLN: 3.0000 mL | INTRAMUSCULAR | Status: DC | PRN

## 2012-05-09 SURGICAL SUPPLY — 55 items
ADH SKN CLS APL DERMABOND .7 (GAUZE/BANDAGES/DRESSINGS) ×1
APL SKNCLS STERI-STRIP NONHPOA (GAUZE/BANDAGES/DRESSINGS) ×1
BAG DECANTER FOR FLEXI CONT (MISCELLANEOUS) ×2 IMPLANT
BENZOIN TINCTURE PRP APPL 2/3 (GAUZE/BANDAGES/DRESSINGS) ×2 IMPLANT
BIT DRILL SKYLINE 12MM (BIT) IMPLANT
BUR MATCHSTICK NEURO 3.0 LAGG (BURR) ×2 IMPLANT
CANISTER SUCTION 2500CC (MISCELLANEOUS) ×2 IMPLANT
CLOTH BEACON ORANGE TIMEOUT ST (SAFETY) ×2 IMPLANT
CONT SPEC 4OZ CLIKSEAL STRL BL (MISCELLANEOUS) ×2 IMPLANT
DERMABOND ADVANCED (GAUZE/BANDAGES/DRESSINGS) ×1
DERMABOND ADVANCED .7 DNX12 (GAUZE/BANDAGES/DRESSINGS) IMPLANT
DRAPE C-ARM 42X72 X-RAY (DRAPES) ×4 IMPLANT
DRAPE LAPAROTOMY 100X72 PEDS (DRAPES) ×2 IMPLANT
DRAPE MICROSCOPE ZEISS OPMI (DRAPES) ×2 IMPLANT
DRAPE POUCH INSTRU U-SHP 10X18 (DRAPES) ×2 IMPLANT
DRESSING TELFA 8X3 (GAUZE/BANDAGES/DRESSINGS) ×2 IMPLANT
DRILL BIT SKYLINE 12MM (BIT) ×2
DRSG OPSITE 4X5.5 SM (GAUZE/BANDAGES/DRESSINGS) ×2 IMPLANT
DURAPREP 6ML APPLICATOR 50/CS (WOUND CARE) ×2 IMPLANT
ELECT COATED BLADE 2.86 ST (ELECTRODE) ×2 IMPLANT
ELECT REM PT RETURN 9FT ADLT (ELECTROSURGICAL) ×2
ELECTRODE REM PT RTRN 9FT ADLT (ELECTROSURGICAL) ×1 IMPLANT
GAUZE SPONGE 4X4 16PLY XRAY LF (GAUZE/BANDAGES/DRESSINGS) IMPLANT
GLOVE BIO SURGEON STRL SZ8 (GLOVE) ×2 IMPLANT
GLOVE BIOGEL PI IND STRL 6.5 (GLOVE) IMPLANT
GLOVE BIOGEL PI INDICATOR 6.5 (GLOVE) ×1
GLOVE INDICATOR 7.0 STRL GRN (GLOVE) ×1 IMPLANT
GOWN BRE IMP SLV AUR LG STRL (GOWN DISPOSABLE) ×1 IMPLANT
GOWN BRE IMP SLV AUR XL STRL (GOWN DISPOSABLE) ×1 IMPLANT
GOWN STRL REIN 2XL LVL4 (GOWN DISPOSABLE) ×2 IMPLANT
HEMOSTAT POWDER KIT SURGIFOAM (HEMOSTASIS) ×2 IMPLANT
HEMOSTAT POWDER SURGIFOAM 1G (HEMOSTASIS) ×1 IMPLANT
IMPLT CONSTRUX LORDO 15X12X8MM (Orthopedic Implant) ×1 IMPLANT
KIT BASIN OR (CUSTOM PROCEDURE TRAY) ×2 IMPLANT
KIT ROOM TURNOVER OR (KITS) ×2 IMPLANT
NDL HYPO 25X1 1.5 SAFETY (NEEDLE) ×1 IMPLANT
NDL SPNL 20GX3.5 QUINCKE YW (NEEDLE) ×1 IMPLANT
NEEDLE HYPO 25X1 1.5 SAFETY (NEEDLE) ×2 IMPLANT
NEEDLE SPNL 20GX3.5 QUINCKE YW (NEEDLE) ×2 IMPLANT
NS IRRIG 1000ML POUR BTL (IV SOLUTION) ×2 IMPLANT
PACK LAMINECTOMY NEURO (CUSTOM PROCEDURE TRAY) ×2 IMPLANT
PAD ARMBOARD 7.5X6 YLW CONV (MISCELLANEOUS) ×2 IMPLANT
PLATE SKYLINE 12MM (Plate) ×1 IMPLANT
PUTTY ABX ACTIFUSE 1.5ML (Putty) ×1 IMPLANT
RUBBERBAND STERILE (MISCELLANEOUS) ×4 IMPLANT
SCREW VARIABLE SELF TAP 12MM (Screw) ×4 IMPLANT
SPONGE INTESTINAL PEANUT (DISPOSABLE) ×2 IMPLANT
SPONGE SURGIFOAM ABS GEL SZ50 (HEMOSTASIS) ×2 IMPLANT
STRIP CLOSURE SKIN 1/2X4 (GAUZE/BANDAGES/DRESSINGS) ×2 IMPLANT
SUT VIC AB 3-0 SH 8-18 (SUTURE) ×3 IMPLANT
SYR 20ML ECCENTRIC (SYRINGE) ×2 IMPLANT
TOWEL OR 17X24 6PK STRL BLUE (TOWEL DISPOSABLE) ×2 IMPLANT
TOWEL OR 17X26 10 PK STRL BLUE (TOWEL DISPOSABLE) ×2 IMPLANT
TRAP SPECIMEN MUCOUS 40CC (MISCELLANEOUS) ×1 IMPLANT
WATER STERILE IRR 1000ML POUR (IV SOLUTION) ×2 IMPLANT

## 2012-05-09 NOTE — Transfer of Care (Signed)
Immediate Anesthesia Transfer of Care Note  Patient: Karen Vasquez  Procedure(s) Performed: Procedure(s) (LRB): ANTERIOR CERVICAL DECOMPRESSION/DISCECTOMY FUSION 1 LEVEL (N/A)  Patient Location: PACU  Anesthesia Type: General  Level of Consciousness: awake, alert  and oriented  Airway & Oxygen Therapy: Patient Spontanous Breathing and Patient connected to face mask oxygen  Post-op Assessment: Report given to PACU RN, Post -op Vital signs reviewed and stable and Patient moving all extremities X 4  Post vital signs: Reviewed and stable  Complications: No apparent anesthesia complications

## 2012-05-09 NOTE — Preoperative (Signed)
Beta Blockers   Reason not to administer Beta Blockers:Not Applicable 

## 2012-05-09 NOTE — H&P (Signed)
Subjective:   Patient is a 61 y.o. female admitted for ACDF C5-6 for spondylosis. The patient first presented to me with complaints of neck pain and shooting pains in the arm(s). Onset of symptoms was several months ago. The pain is described as aching and occurs intermittently. The pain is rated moderate, and is located at the base of the neck and radiates to the L > R arm.. The symptoms have been progressive. Symptoms are exacerbated by extending head backwards, and are relieved by medications.  Previous work up includes MRI of cervical spine, results: disc bulge at C5-C6 bilateral.  Past Medical History  Diagnosis Date  . Thyroid nodule   . GERD (gastroesophageal reflux disease)   . Urine blood     hematuria  . Hernia of abdominal wall     umbilical  . Arthritis   . Recurrent upper respiratory infection (URI)     recent sinus inf  . No pertinent past medical history     COLD UPPER RESP STARTED [facility]PAK 6/14    Past Surgical History  Procedure Date  . Abdominal hysterectomy 08    vag, rectocele repair , bladder sling  . Carpal tunnel release 05    bil  . Breast surgery     03  . Dilation and curettage of uterus 03     polyps  . Breast cyst excision 96    fibroadenoma lft  . Tubal ligation 88    Allergies  Allergen Reactions  . Prednisone Other (See Comments)    Steroids give me chest pain"  . Septra (Sulfamethoxazole-Tmp Ds) Rash    History  Substance Use Topics  . Smoking status: Never Smoker   . Smokeless tobacco: Not on file  . Alcohol Use: No    No family history on file. Prior to Admission medications   Medication Sig Start Date End Date Taking? Authorizing Provider  acetaminophen (TYLENOL) 500 MG tablet Take 500 mg by mouth every 6 (six) hours as needed. occ 650 2 twice a day   Yes Historical Provider, MD  Ascorbic Acid (VITAMIN C) 1000 MG tablet Take 1,000 mg by mouth daily.   Yes Historical Provider, MD  aspirin 81 MG tablet Take 81 mg by mouth daily. 2 daily    Yes Historical Provider, MD  atorvastatin (LIPITOR) 20 MG tablet Take 20 mg by mouth daily.   Yes Historical Provider, MD  azelastine (ASTELIN) 137 MCG/SPRAY nasal spray Place 1 spray into the nose as needed. Use in each nostril as directed   Yes Historical Provider, MD  calcium citrate (CALCITRATE - DOSED IN MG ELEMENTAL CALCIUM) 950 MG tablet Take 1 tablet by mouth daily.   Yes Historical Provider, MD  cholecalciferol (VITAMIN D) 1000 UNITS tablet Take 1,000 Units by mouth daily. 2 daily   Yes Historical Provider, MD  dexlansoprazole (DEXILANT) 60 MG capsule Take 60 mg by mouth daily.   Yes Historical Provider, MD  estradiol (ESTRACE) 0.1 MG/GM vaginal cream Place 2 g vaginally daily.   Yes Historical Provider, MD  fish oil-omega-3 fatty acids 1000 MG capsule Take 1 g by mouth 5 (five) times daily.   Yes Historical Provider, MD  ketotifen (ZADITOR) 0.025 % ophthalmic solution Place 1 drop into both eyes 2 (two) times daily.   Yes Historical Provider, MD  loratadine (CLARITIN) 10 MG tablet Take 10 mg by mouth daily.   Yes Historical Provider, MD  Misc Natural Products (TURMERIC CURCUMIN) CAPS Take 1 capsule by mouth daily. 900 mg  Yes Historical Provider, MD  mometasone (NASONEX) 50 MCG/ACT nasal spray Place 2 sprays into the nose as needed.   Yes Historical Provider, MD  Multiple Vitamin (MULTIVITAMIN) tablet Take 1 tablet by mouth daily.   Yes Historical Provider, MD  raloxifene (EVISTA) 60 MG tablet Take 60 mg by mouth daily.   Yes Historical Provider, MD  sodium chloride (OCEAN) 0.65 % nasal spray Place 1 spray into the nose as needed.   Yes Historical Provider, MD  traZODone (DESYREL) 50 MG tablet Take 50 mg by mouth at bedtime.   Yes Historical Provider, MD  diclofenac sodium (VOLTAREN) 1 % GEL Apply topically.    Historical Provider, MD     Review of Systems  Positive ROS: neg  All other systems have been reviewed and were otherwise negative with the exception of those mentioned in  the HPI and as above.  Objective: Vital signs in last 24 hours: Temp:  [98.5 F (36.9 C)] 98.5 F (36.9 C) (06/26 0709) Pulse Rate:  [69] 69  (06/26 0709) Resp:  [18] 18  (06/26 0709) BP: (151)/(65) 151/65 mmHg (06/26 0709) SpO2:  [97 %] 97 % (06/26 0709)  General Appearance: Alert, cooperative, no distress, appears stated age Head: Normocephalic, without obvious abnormality, atraumatic Eyes: PERRL, conjunctiva/corneas clear, EOM's intact, fundi benign, both eyes      Ears: Normal TM's and external ear canals, both ears Throat: Lips, mucosa, and tongue normal; teeth and gums normal Neck: Supple, symmetrical, trachea midline, no adenopathy; thyroid: No enlargement/tenderness/nodules; no carotid bruit or JVD Back: Symmetric, no curvature, ROM normal, no CVA tenderness Lungs: Clear to auscultation bilaterally, respirations unlabored Heart: Regular rate and rhythm, S1 and S2 normal, no murmur, rub or gallop Abdomen: Soft, non-tender, bowel sounds active all four quadrants, no masses, no organomegaly Extremities: Extremities normal, atraumatic, no cyanosis or edema Pulses: 2+ and symmetric all extremities Skin: Skin color, texture, turgor normal, no rashes or lesions  NEUROLOGIC:  Mental status: Alert and oriented x4, no aphasia, good attention span, fund of knowledge and memory  Motor Exam - grossly normal Sensory Exam - grossly normal Reflexes: 1+ Coordination - grossly normal Gait - grossly normal Balance - grossly normal Cranial Nerves: I: smell Not tested  II: visual acuity  OS: nl    OD: nl  II: visual fields Full to confrontation  II: pupils Equal, round, reactive to light  III,VII: ptosis None  III,IV,VI: extraocular muscles  Full ROM  V: mastication Normal  V: facial light touch sensation  Normal  V,VII: corneal reflex  Present  VII: facial muscle function - upper  Normal  VII: facial muscle function - lower Normal  VIII: hearing Not tested  IX: soft palate  elevation  Normal  IX,X: gag reflex Present  XI: trapezius strength  5/5  XI: sternocleidomastoid strength 5/5  XI: neck flexion strength  5/5  XII: tongue strength  Normal    Data Review Lab Results  Component Value Date   WBC 7.5 05/01/2012   HGB 13.6 05/01/2012   HCT 41.3 05/01/2012   MCV 89.6 05/01/2012   PLT 256 05/01/2012   Lab Results  Component Value Date   NA 140 05/01/2012   K 3.9 05/01/2012   CL 101 05/01/2012   CO2 26 05/01/2012   BUN 13 05/01/2012   CREATININE 0.65 05/01/2012   GLUCOSE 127* 05/01/2012   Lab Results  Component Value Date   INR 1.00 05/01/2012    Assessment:   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 Patient has failed conservative therapy. Planned surgery : ACDF C5-6  Plan:   I explained the condition and procedure to the patient and answered any questions.  Patient wishes to proceed with procedure as planned. Understands risks/ benefits/ and expected or typical outcomes.  Kellyann Ordway S 05/09/2012 8:49 AM

## 2012-05-09 NOTE — Progress Notes (Signed)
Patient ID: Karen Vasquez, female   DOB: [birth year], 61 y.o.   MRN: [id number] Looks good postop. No arm pain or numbness tingling or weakness. Appropriate neck and shoulder soreness. Doing well.

## 2012-05-09 NOTE — Anesthesia Preprocedure Evaluation (Addendum)
Anesthesia Evaluation  Patient identified by MRN, date of birth, ID band Patient awake    Reviewed: Allergy & Precautions, H&P , NPO status , Patient's Chart, lab work & pertinent test results  Airway Mallampati: I TM Distance: >3 FB Neck ROM: Full    Dental  (+) Teeth Intact and Dental Advisory Given   Pulmonary Recent URI ,  breath sounds clear to auscultation        Cardiovascular Rhythm:Regular Rate:Normal     Neuro/Psych TIA   GI/Hepatic GERD-  Medicated,  Endo/Other    Renal/GU      Musculoskeletal  (+) Arthritis -, Osteoarthritis,    Abdominal   Peds  Hematology   Anesthesia Other Findings   Reproductive/Obstetrics                         Anesthesia Physical Anesthesia Plan  ASA: II  Anesthesia Plan: General   Post-op Pain Management:    Induction: Intravenous  Airway Management Planned: Oral ETT  Additional Equipment:   Intra-op Plan:   Post-operative Plan: Extubation in OR  Informed Consent: I have reviewed the patients History and Physical, chart, labs and discussed the procedure including the risks, benefits and alternatives for the proposed anesthesia with the patient or authorized representative who has indicated his/her understanding and acceptance.   Dental advisory given  Plan Discussed with: CRNA, Anesthesiologist and Surgeon  Anesthesia Plan Comments:         Anesthesia Quick Evaluation

## 2012-05-09 NOTE — Op Note (Signed)
05/09/2012  10:33 AM  PATIENT:  Karen Vasquez  61 y.o. 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 Decompressive anterior cervical discectomy C5-6, 2. Anterior cervical arthrodesis C5-6 utilizing a 8 mm peek interbody cage packed with local autograft and morcellized allograft, 3. Anterior cervical plating C5-6 utilizing a Depew spine plate  SURGEON:  Marikay Alar, MD  ASSISTANTS: None  ANESTHESIA:   General  EBL: Less than 25 ml  Total I/O In: -  Out: 25 [Blood:25]  BLOOD ADMINISTERED:none  DRAINS: None   SPECIMEN:  No Specimen  INDICATION FOR PROCEDURE: This is a patient who presented with severe neck pain with radiation into her left arm on her right arm. She had an MRI which showed severe spondylosis at C5-6 with foraminal stenosis as well as central stenosis and cord compression. She tried medical management without relief quite a long time. Recommended ACDF with plating at C5-6. Patient understood the risks, benefits, and alternatives and potential outcomes and wished to proceed.  PROCEDURE DETAILS: Patient was brought to the operating room placed under general endotracheal anesthesia. Patient was placed in the supine position on the operating room table. The neck was prepped with Duraprep and draped in a sterile fashion.   Three cc of local anesthesia was injected and a transverse incision was made on the right side of the neck.  Dissection was carried down thru the subcutaneous tissue and the platysma was  elevated, opened, and undermined with Metzenbaum scissors.  Dissection was then carried out thru an avascular plane leaving the sternocleidomastoid carotid artery and jugular vein laterally and the trachea and esophagus medially. The ventral aspect of the vertebral column was identified and a localizing x-ray was taken. The C5-6 level  was identified. The longus colli muscles were then elevated and the retractor was placed. The annulus was incised and the disc space entered. Discectomy was performed with micro-curettes and pituitary rongeurs. I then used the high-speed drill to drill the endplates down to the level of the posterior longitudinal ligament. The drill shavings were saved in a mucous trap for later arthrodesis. The operating microscope was draped and brought into the field provided additional magnification, illumination and visualization. Discectomy was continued posteriorly thru the disc space. Posterior longitudinal ligament was opened with a nerve hook, and then removed along with disc herniation and osteophytes, decompressing the spinal canal and thecal sac. We then continued to remove osteophytic overgrowth and disc material decompressing the neural foramina and exiting nerve roots bilaterally. There was a large osteophyte coming off the inferior endplate of C5 it was removed by undercutting the vertebral body of C5. Was a large osteophyte compressing the C6 nerve root on the left that was removed. I decompress both nerve roots and follows distal to the pedicle.The scope was angled up and down to help decompress and undercut the vertebral bodies. Once the decompression was completed we could pass a nerve hook circumferentially to assure adequate decompression in the midline and in the neural foramina. So by both visualization and palpation we felt we had an adequate decompression of the neural elements. We then measured the height of the intravertebral disc space and selected a 8 millimeter Peek interbody cage packed with autograft and morcellized allograft. It was then gently positioned in the intravertebral disc space and countersunk. I then used a Depew spine plate and placed four variable angle  screws into the vertebral bodies and locked them into position. The wound was irrigated with bacitracin solution, checked for  hemostasis which was established and confirmed. Once meticulous hemostasis was achieved, we then proceeded with closure. The platysma was closed with interrupted 3-0 undyed Vicryl suture, the subcuticular layer was closed with interrupted 3-0 undyed Vicryl suture. The skin edges were approximated with steristrips. The drapes were removed. A sterile dressing was applied. The patient was then awakened from general anesthesia and transferred to the recovery room in stable condition. At the end of the procedure all sponge, needle and instrument counts were correct.   PLAN OF CARE: Admit for overnight observation  PATIENT DISPOSITION:  PACU - hemodynamically stable.   Delay start of Pharmacological VTE agent (>24hrs) due to surgical blood loss or risk of bleeding:  yes

## 2012-05-09 NOTE — Anesthesia Procedure Notes (Signed)
Procedure Name: Intubation Date/Time: 05/09/2012 9:00 AM Performed by: Quentin Ore Pre-anesthesia Checklist: Patient identified, Emergency Drugs available, Suction available, Patient being monitored and Timeout performed Patient Re-evaluated:Patient Re-evaluated prior to inductionOxygen Delivery Method: Circle system utilized and Simple face mask Preoxygenation: Pre-oxygenation with 100% oxygen Intubation Type: IV induction Ventilation: Mask ventilation without difficulty and Oral airway inserted - appropriate to patient size Laryngoscope Size: Mac and 3 Grade View: Grade III Tube type: Oral Tube size: 7.5 mm Number of attempts: 1 Airway Equipment and Method: Bougie stylet and Oral airway Placement Confirmation: positive ETCO2,  CO2 detector and breath sounds checked- equal and bilateral Secured at: 22 cm Tube secured with: Tape Dental Injury: Teeth and Oropharynx as per pre-operative assessment

## 2012-05-09 NOTE — Anesthesia Postprocedure Evaluation (Signed)
  Anesthesia Post-op Note  Patient: Karen Vasquez  Procedure(s) Performed: Procedure(s) (LRB): ANTERIOR CERVICAL DECOMPRESSION/DISCECTOMY FUSION 1 LEVEL (N/A)  Patient Location: PACU  Anesthesia Type: General  Level of Consciousness: awake, alert  and oriented  Airway and Oxygen Therapy: Patient Spontanous Breathing and Patient connected to nasal cannula oxygen  Post-op Pain: mild  Post-op Assessment: Post-op Vital signs reviewed  Post-op Vital Signs: Reviewed  Complications: No apparent anesthesia complications

## 2012-05-10 ENCOUNTER — Encounter (HOSPITAL_COMMUNITY): Payer: Self-pay | Admitting: Neurological Surgery

## 2012-05-10 MED ORDER — HYDROCODONE-ACETAMINOPHEN 5-325 MG PO TABS: 1.0000 | ORAL_TABLET | Freq: Four times a day (QID) | ORAL | Status: AC | PRN

## 2012-05-10 MED ORDER — METHOCARBAMOL 500 MG PO TABS: 500.0000 mg | ORAL_TABLET | Freq: Four times a day (QID) | ORAL | Status: AC | PRN

## 2012-05-10 NOTE — Discharge Summary (Signed)
Physician Discharge Summary  Patient ID: Karen Vasquez MRN: [id number] DOB/AGE: 62-Jun-1952 62 y.o.  Admit date: 05/09/2012 Discharge date: 05/10/2012  Admission Diagnoses: Cervical spondylosis with stenosis    Discharge Diagnoses: Same   Discharged Condition: good  Hospital Course: The patient was admitted on 05/09/2012 and taken to the operating room where the patient underwent ACDF C5-6. The patient tolerated the procedure well and was taken to the recovery room and then to the floor in stable condition. The hospital course was routine. There were no complications. The wound remained clean dry and intact. Pt had appropriate neck soreness. No complaints of arm pain or new N/T/W. The patient remained afebrile with stable vital signs, and tolerated a regular diet. The patient continued to increase activities, and pain was well controlled with oral pain medications.   Consults: None  Significant Diagnostic Studies:  Results for orders placed during the hospital encounter of 05/01/12  BASIC METABOLIC PANEL      Component Value Range   Sodium 140  135 - 145 mEq/L   Potassium 3.9  3.5 - 5.1 mEq/L   Chloride 101  96 - 112 mEq/L   CO2 26  19 - 32 mEq/L   Glucose, Bld 127 (*) 70 - 99 mg/dL   BUN 13  6 - 23 mg/dL   Creatinine, Ser 1.47  0.50 - 1.10 mg/dL   Calcium 9.6  8.4 - 82.9 mg/dL   GFR calc non Af Amer >90  >90 mL/min   GFR calc Af Amer >90  >90 mL/min  CBC      Component Value Range   WBC 7.5  4.0 - 10.5 K/uL   RBC 4.61  3.87 - 5.11 MIL/uL   Hemoglobin 13.6  12.0 - 15.0 g/dL   HCT 56.2  13.0 - 86.5 %   MCV 89.6  78.0 - 100.0 fL   MCH 29.5  26.0 - 34.0 pg   MCHC 32.9  30.0 - 36.0 g/dL   RDW 78.4  69.6 - 29.5 %   Platelets 256  150 - 400 K/uL  PROTIME-INR      Component Value Range   Prothrombin Time 13.4  11.6 - 15.2 seconds   INR 1.00  0.00 - 1.49  SURGICAL PCR SCREEN      Component Value Range   MRSA, PCR NEGATIVE  NEGATIVE   Staphylococcus aureus NEGATIVE  NEGATIVE     Chest 2 View  05/01/2012  *RADIOLOGY REPORT*  Clinical Data: Preoperative assessment for cervical spine fusion  CHEST - 2 VIEW  Comparison: None  Findings: Normal heart size, mediastinal contours, and pulmonary vascularity. Lungs clear. No pleural effusion or pneumothorax. Bones unremarkable.  IMPRESSION: No acute abnormalities.  Original Report Authenticated By: Lollie Marrow, M.D.   Dg Cervical Spine 1 View  05/09/2012  *RADIOLOGY REPORT*  Clinical Data: Neck pain  DG C-ARM 1-60 MIN,DG CERVICAL SPINE - 1 VIEW  Comparison: None.  Findings: C-arm films document C5-6 ACDF.  No adverse features.  IMPRESSION: As above.  Original Report Authenticated By: Elsie Stain, M.D.   Dg C-arm 1-60 Min  05/09/2012  *RADIOLOGY REPORT*  Clinical Data: Neck pain  DG C-ARM 1-60 MIN,DG CERVICAL SPINE - 1 VIEW  Comparison: None.  Findings: C-arm films document C5-6 ACDF.  No adverse features.  IMPRESSION: As above.  Original Report Authenticated By: Elsie Stain, M.D.    Antibiotics:  Anti-infectives     Start     Dose/Rate Route Frequency Ordered Stop  05/09/12 1800   ceFAZolin (ANCEF) IVPB 1 g/50 mL premix        1 g 100 mL/hr over 30 Minutes Intravenous Every 8 hours 05/09/12 1240 05/10/12 0229   05/09/12 0854   bacitracin 50,000 Units in sodium chloride irrigation 0.9 % 500 mL irrigation  Status:  Discontinued          As needed 05/09/12 0855 05/09/12 1030   05/09/12 0822   bacitracin [address] UNITS injection     Comments: RATCLIFF, ESTHER: cabinet override         05/09/12 0822 05/09/12 2029   05/08/12 1423   ceFAZolin (ANCEF) IVPB 1 g/50 mL premix        1 g 100 mL/hr over 30 Minutes Intravenous 60 min pre-op 05/08/12 1423 05/09/12 0907          Discharge Exam: Blood pressure 91/68, pulse 76, temperature 98.9 F (37.2 C), temperature source Oral, resp. rate 16, SpO2 95.00%. Neurologic: Grossly normal Incision clean dry and intact  Discharge Medications:   Medication List  As of  05/10/2012  7:33 AM   TAKE these medications         acetaminophen 500 MG tablet   Commonly known as: TYLENOL   Take 500 mg by mouth every 6 (six) hours as needed. occ 650 2 twice a day      aspirin 81 MG tablet   Take 81 mg by mouth daily. 2 daily      atorvastatin 20 MG tablet   Commonly known as: LIPITOR   Take 20 mg by mouth daily.      azelastine 137 MCG/SPRAY nasal spray   Commonly known as: ASTELIN   Place 1 spray into the nose as needed. Use in each nostril as directed      calcium citrate 950 MG tablet   Commonly known as: CALCITRATE - dosed in mg elemental calcium   Take 1 tablet by mouth daily.      cholecalciferol 1000 UNITS tablet   Commonly known as: VITAMIN D   Take 1,000 Units by mouth daily. 2 daily      DEXILANT 60 MG capsule   Generic drug: dexlansoprazole   Take 60 mg by mouth daily.      diclofenac sodium 1 % Gel   Commonly known as: VOLTAREN   Apply topically.      estradiol 0.1 MG/GM vaginal cream   Commonly known as: ESTRACE   Place 2 g vaginally daily.      fish oil-omega-3 fatty acids 1000 MG capsule   Take 1 g by mouth 5 (five) times daily.      HYDROcodone-acetaminophen 5-325 MG per tablet   Commonly known as: NORCO   Take 1-2 tablets by mouth every 6 (six) hours as needed.      ketotifen 0.025 % ophthalmic solution   Commonly known as: ZADITOR   Place 1 drop into both eyes 2 (two) times daily.      loratadine 10 MG tablet   Commonly known as: CLARITIN   Take 10 mg by mouth daily.      methocarbamol 500 MG tablet   Commonly known as: ROBAXIN   Take 1 tablet (500 mg total) by mouth every 6 (six) hours as needed.      mometasone 50 MCG/ACT nasal spray   Commonly known as: NASONEX   Place 2 sprays into the nose as needed.      multivitamin tablet   Take 1 tablet by mouth daily.  raloxifene 60 MG tablet   Commonly known as: EVISTA   Take 60 mg by mouth daily.      sodium chloride 0.65 % nasal spray   Commonly known as:  OCEAN   Place 1 spray into the nose as needed.      traZODone 50 MG tablet   Commonly known as: DESYREL   Take 50 mg by mouth at bedtime.      Turmeric Curcumin Caps   Take 1 capsule by mouth daily. 900 mg      vitamin C 1000 MG tablet   Take 1,000 mg by mouth daily.            Disposition: Home   Final Dx: ACDF C5-6  Discharge Orders    Future Orders Please Complete By Expires   Diet - low sodium heart healthy      Increase activity slowly      Driving Restrictions      Comments:   2 weeks   Lifting restrictions      Comments:   less than 8 lbs   Call MD for:  temperature >100.4      Call MD for:  persistant nausea and vomiting      Call MD for:  severe uncontrolled pain      Call MD for:  redness, tenderness, or signs of infection (pain, swelling, redness, odor or green/yellow discharge around incision site)      Call MD for:  difficulty breathing, headache or visual disturbances         Follow-up Information    Follow up with Argyle Gustafson S, MD. Schedule an appointment as soon as possible for a visit in 2 weeks.   Contact information:   [address], [address] [place] [address] [phone]           Signed: Tia Alert 05/10/2012, 7:33 AM

## 2012-05-10 NOTE — Progress Notes (Signed)
Pt given D/C instructions with Rx's. Pt verbalized understanding of teaching. Pt D/C'd home via wheelchair with husband @ 0945 per MD order. Rema Fendt, RN

## 2012-12-04 ENCOUNTER — Other Ambulatory Visit: Payer: Self-pay | Admitting: Internal Medicine

## 2012-12-04 DIAGNOSIS — E041 Nontoxic single thyroid nodule: Secondary | ICD-10-CM

## 2012-12-10 ENCOUNTER — Other Ambulatory Visit: Payer: BC Managed Care – PPO

## 2012-12-14 ENCOUNTER — Ambulatory Visit
Admission: RE | Admit: 2012-12-14 | Discharge: 2012-12-14 | Disposition: A | Discharge status: Home / Self Care | Payer: BC Managed Care – PPO | Source: Ambulatory Visit | Attending: Internal Medicine | Admitting: Internal Medicine

## 2012-12-14 DIAGNOSIS — E041 Nontoxic single thyroid nodule: Secondary | ICD-10-CM

## 2013-01-28 ENCOUNTER — Other Ambulatory Visit (HOSPITAL_COMMUNITY): Payer: Self-pay | Admitting: Urology

## 2013-01-28 DIAGNOSIS — N281 Cyst of kidney, acquired: Secondary | ICD-10-CM

## 2013-01-30 ENCOUNTER — Ambulatory Visit (HOSPITAL_COMMUNITY)
Admission: RE | Admit: 2013-01-30 | Discharge: 2013-01-30 | Disposition: A | Discharge status: Home / Self Care | Payer: BC Managed Care – PPO | Source: Ambulatory Visit | Attending: Urology | Admitting: Urology

## 2013-01-30 DIAGNOSIS — R310 Gross hematuria: Secondary | ICD-10-CM | POA: Insufficient documentation

## 2013-01-30 DIAGNOSIS — N281 Cyst of kidney, acquired: Secondary | ICD-10-CM | POA: Insufficient documentation

## 2013-01-30 LAB — CREATININE, SERUM: GFR calc non Af Amer: >90 mL/min (ref >90)

## 2013-01-30 MED ORDER — GADOBENATE DIMEGLUMINE 529 MG/ML IV SOLN
20.0000 mL | Freq: Once | INTRAVENOUS | Status: AC | PRN
  Administered 2013-01-30: 16 mL via INTRAVENOUS

## 2013-01-31 MED ORDER — GADOBENATE DIMEGLUMINE 529 MG/ML IV SOLN
16.0000 mL | Freq: Once | INTRAVENOUS | Status: AC | PRN
  Administered 2013-01-31: 16 mL via INTRAVENOUS

## 2014-10-29 ENCOUNTER — Other Ambulatory Visit: Payer: Self-pay | Admitting: Dermatology

## 2015-05-05 ENCOUNTER — Other Ambulatory Visit: Payer: Self-pay | Admitting: Internal Medicine

## 2015-05-05 DIAGNOSIS — E041 Nontoxic single thyroid nodule: Secondary | ICD-10-CM

## 2015-05-15 ENCOUNTER — Telehealth: Payer: Self-pay | Admitting: Emergency Medicine

## 2015-05-15 NOTE — Telephone Encounter (Signed)
Spoke with pt. Let her know that [facility] recommends [place] or [facility].

## 2015-10-12 SURGERY — PARS PLANA VITRECTOMY WITH LASER FOR MACULAR HOLE

## 2015-10-27 ENCOUNTER — Other Ambulatory Visit: Payer: Self-pay

## 2015-11-18 ENCOUNTER — Ambulatory Visit
Admission: RE | Admit: 2015-11-18 | Discharge: 2015-11-18 | Disposition: A | Discharge status: Home / Self Care | Payer: BLUE CROSS/BLUE SHIELD | Source: Ambulatory Visit | Attending: Internal Medicine | Admitting: Internal Medicine

## 2015-11-18 DIAGNOSIS — E041 Nontoxic single thyroid nodule: Secondary | ICD-10-CM

## 2016-02-22 DIAGNOSIS — M542 Cervicalgia: Secondary | ICD-10-CM | POA: Diagnosis not present

## 2016-02-29 DIAGNOSIS — R05 Cough: Secondary | ICD-10-CM | POA: Diagnosis not present

## 2016-02-29 DIAGNOSIS — H1045 Other chronic allergic conjunctivitis: Secondary | ICD-10-CM | POA: Diagnosis not present

## 2016-02-29 DIAGNOSIS — J3089 Other allergic rhinitis: Secondary | ICD-10-CM | POA: Diagnosis not present

## 2016-03-03 DIAGNOSIS — M542 Cervicalgia: Secondary | ICD-10-CM | POA: Diagnosis not present

## 2016-03-03 DIAGNOSIS — M50223 Other cervical disc displacement at C6-C7 level: Secondary | ICD-10-CM | POA: Diagnosis not present

## 2016-03-04 DIAGNOSIS — J019 Acute sinusitis, unspecified: Secondary | ICD-10-CM | POA: Diagnosis not present

## 2016-03-04 DIAGNOSIS — J301 Allergic rhinitis due to pollen: Secondary | ICD-10-CM | POA: Diagnosis not present

## 2016-03-07 DIAGNOSIS — I10 Essential (primary) hypertension: Secondary | ICD-10-CM | POA: Diagnosis not present

## 2016-03-07 DIAGNOSIS — Z6827 Body mass index (BMI) 27.0-27.9, adult: Secondary | ICD-10-CM | POA: Diagnosis not present

## 2016-03-07 DIAGNOSIS — M542 Cervicalgia: Secondary | ICD-10-CM | POA: Diagnosis not present

## 2016-04-28 DIAGNOSIS — R300 Dysuria: Secondary | ICD-10-CM | POA: Diagnosis not present

## 2016-04-28 DIAGNOSIS — E7800 Pure hypercholesterolemia, unspecified: Secondary | ICD-10-CM | POA: Diagnosis not present

## 2016-04-28 DIAGNOSIS — N390 Urinary tract infection, site not specified: Secondary | ICD-10-CM | POA: Diagnosis not present

## 2016-05-03 DIAGNOSIS — E7800 Pure hypercholesterolemia, unspecified: Secondary | ICD-10-CM | POA: Diagnosis not present

## 2016-05-03 DIAGNOSIS — M810 Age-related osteoporosis without current pathological fracture: Secondary | ICD-10-CM | POA: Diagnosis not present

## 2016-05-03 DIAGNOSIS — I10 Essential (primary) hypertension: Secondary | ICD-10-CM | POA: Diagnosis not present

## 2016-05-03 DIAGNOSIS — M1990 Unspecified osteoarthritis, unspecified site: Secondary | ICD-10-CM | POA: Diagnosis not present

## 2016-05-18 DIAGNOSIS — R319 Hematuria, unspecified: Secondary | ICD-10-CM | POA: Diagnosis not present

## 2016-05-18 DIAGNOSIS — R300 Dysuria: Secondary | ICD-10-CM | POA: Diagnosis not present

## 2016-05-18 DIAGNOSIS — N390 Urinary tract infection, site not specified: Secondary | ICD-10-CM | POA: Diagnosis not present

## 2016-05-18 DIAGNOSIS — R350 Frequency of micturition: Secondary | ICD-10-CM | POA: Diagnosis not present

## 2016-05-20 DIAGNOSIS — N3021 Other chronic cystitis with hematuria: Secondary | ICD-10-CM | POA: Diagnosis not present

## 2016-05-20 DIAGNOSIS — R310 Gross hematuria: Secondary | ICD-10-CM | POA: Diagnosis not present

## 2016-06-22 DIAGNOSIS — N3021 Other chronic cystitis with hematuria: Secondary | ICD-10-CM | POA: Diagnosis not present

## 2016-06-22 DIAGNOSIS — N816 Rectocele: Secondary | ICD-10-CM | POA: Diagnosis not present

## 2016-06-29 DIAGNOSIS — N3021 Other chronic cystitis with hematuria: Secondary | ICD-10-CM | POA: Diagnosis not present

## 2016-07-19 DIAGNOSIS — N3020 Other chronic cystitis without hematuria: Secondary | ICD-10-CM | POA: Diagnosis not present

## 2016-08-03 DIAGNOSIS — Z23 Encounter for immunization: Secondary | ICD-10-CM | POA: Diagnosis not present

## 2016-08-08 DIAGNOSIS — J329 Chronic sinusitis, unspecified: Secondary | ICD-10-CM | POA: Diagnosis not present

## 2016-09-08 DIAGNOSIS — L219 Seborrheic dermatitis, unspecified: Secondary | ICD-10-CM | POA: Diagnosis not present

## 2016-09-15 DIAGNOSIS — M62838 Other muscle spasm: Secondary | ICD-10-CM | POA: Diagnosis not present

## 2016-09-15 DIAGNOSIS — I10 Essential (primary) hypertension: Secondary | ICD-10-CM | POA: Diagnosis not present

## 2016-10-20 DIAGNOSIS — R490 Dysphonia: Secondary | ICD-10-CM | POA: Diagnosis not present

## 2016-10-24 DIAGNOSIS — R05 Cough: Secondary | ICD-10-CM | POA: Diagnosis not present

## 2016-10-24 DIAGNOSIS — R490 Dysphonia: Secondary | ICD-10-CM | POA: Diagnosis not present

## 2016-10-24 DIAGNOSIS — J3089 Other allergic rhinitis: Secondary | ICD-10-CM | POA: Diagnosis not present

## 2016-10-24 DIAGNOSIS — K219 Gastro-esophageal reflux disease without esophagitis: Secondary | ICD-10-CM | POA: Diagnosis not present

## 2016-10-25 ENCOUNTER — Other Ambulatory Visit: Payer: Self-pay | Admitting: Otolaryngology

## 2016-10-25 DIAGNOSIS — R05 Cough: Secondary | ICD-10-CM

## 2016-10-25 DIAGNOSIS — J3800 Paralysis of vocal cords and larynx, unspecified: Secondary | ICD-10-CM

## 2016-10-25 DIAGNOSIS — R059 Cough, unspecified: Secondary | ICD-10-CM

## 2016-11-01 ENCOUNTER — Ambulatory Visit
Admission: RE | Admit: 2016-11-01 | Discharge: 2016-11-01 | Disposition: A | Discharge status: Home / Self Care | Payer: BLUE CROSS/BLUE SHIELD | Source: Ambulatory Visit | Attending: Otolaryngology | Admitting: Otolaryngology

## 2016-11-01 DIAGNOSIS — R221 Localized swelling, mass and lump, neck: Secondary | ICD-10-CM | POA: Diagnosis not present

## 2016-11-01 DIAGNOSIS — N390 Urinary tract infection, site not specified: Secondary | ICD-10-CM | POA: Diagnosis not present

## 2016-11-01 DIAGNOSIS — I10 Essential (primary) hypertension: Secondary | ICD-10-CM | POA: Diagnosis not present

## 2016-11-01 DIAGNOSIS — R05 Cough: Secondary | ICD-10-CM

## 2016-11-01 DIAGNOSIS — R059 Cough, unspecified: Secondary | ICD-10-CM

## 2016-11-01 DIAGNOSIS — J3800 Paralysis of vocal cords and larynx, unspecified: Secondary | ICD-10-CM

## 2016-11-01 DIAGNOSIS — M810 Age-related osteoporosis without current pathological fracture: Secondary | ICD-10-CM | POA: Diagnosis not present

## 2016-11-01 DIAGNOSIS — R918 Other nonspecific abnormal finding of lung field: Secondary | ICD-10-CM | POA: Diagnosis not present

## 2016-11-01 MED ORDER — IOPAMIDOL (ISOVUE-300) INJECTION 61%
150.0000 mL | Freq: Once | INTRAVENOUS | Status: AC | PRN
  Administered 2016-11-01: 150 mL via INTRAVENOUS

## 2016-11-03 ENCOUNTER — Telehealth: Payer: Self-pay

## 2016-11-03 NOTE — Telephone Encounter (Signed)
I will be opening up am 11/09/16 put her on then

## 2016-11-03 NOTE — Telephone Encounter (Signed)
Spoke with [facility] and informed her that we need an order for a referral to be placed. She stated she would fax over the information, she was looking to get an appointment but I informed her that right now I can not set up an appointment. Once we get the paperwork the girls up front will handle as far as contacting the pt. And seeing what provider we can get her in with.

## 2016-11-03 NOTE — Telephone Encounter (Signed)
Message to be left in triage as 11/09/16 has not been opened yet.

## 2016-11-03 NOTE — Telephone Encounter (Signed)
Pt scheduled with MW on 11/09/16 '@10'$ :00. Holley Raring states she will make pt aware of this. Nothing further needed.

## 2016-11-03 NOTE — Telephone Encounter (Signed)
MW  Please advise  Glenda the nurse from Dr. Janace Hoard, called and wanted to know if this pt could be seen sooner than what we have available looks like the first available wont be until Feb. They are referring him for a abnormal scan that shows a mass. We have the reports that they faxed here in Triage, do you mind taking a look and see if you will be able to see them or do you think that the first available appointment will be ok.

## 2016-11-09 ENCOUNTER — Ambulatory Visit (INDEPENDENT_AMBULATORY_CARE_PROVIDER_SITE_OTHER): Payer: BLUE CROSS/BLUE SHIELD | Admitting: Internal Medicine

## 2016-11-09 ENCOUNTER — Telehealth: Payer: Self-pay | Admitting: Internal Medicine

## 2016-11-09 ENCOUNTER — Encounter: Payer: Self-pay | Admitting: Internal Medicine

## 2016-11-09 VITALS — BP 128/88 | HR 77 | Ht 65.0 in | Wt 160.0 lb

## 2016-11-09 DIAGNOSIS — R918 Other nonspecific abnormal finding of lung field: Secondary | ICD-10-CM | POA: Insufficient documentation

## 2016-11-09 DIAGNOSIS — R739 Hyperglycemia, unspecified: Secondary | ICD-10-CM | POA: Diagnosis not present

## 2016-11-09 DIAGNOSIS — E7800 Pure hypercholesterolemia, unspecified: Secondary | ICD-10-CM | POA: Diagnosis not present

## 2016-11-09 DIAGNOSIS — J3801 Paralysis of vocal cords and larynx, unilateral: Secondary | ICD-10-CM

## 2016-11-09 DIAGNOSIS — Z0000 Encounter for general adult medical examination without abnormal findings: Secondary | ICD-10-CM | POA: Diagnosis not present

## 2016-11-09 DIAGNOSIS — I10 Essential (primary) hypertension: Secondary | ICD-10-CM | POA: Diagnosis not present

## 2016-11-09 NOTE — Telephone Encounter (Signed)
[email or web address] x1 

## 2016-11-09 NOTE — Progress Notes (Signed)
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 Pulm Consult    From Dr. Janace Hoard. Abnormal CT scan. Noticed changes 3 months ago. Eating, drinking, post nasal drip makes it hurt. Coughing. Breathing has been ok.   indolent onset of persistent daily mod severe cough/ hoarseness Nov 2107 with mild dysphagia and doe but not choking on food but overt hb symptoms despite ppi bid.  No obvious day to day or daytime variability or assoc excess/ purulent sputum or mucus plugs or hemoptysis or cp or chest tightness, subjective wheeze.  No unusual exp hx or h/o childhood pna/ asthma or knowledge of premature birth.  Sleeping ok without nocturnal  or early am exacerbation  of respiratory  c/o's or need for noct saba. Also denies any obvious fluctuation of symptoms with weather or environmental changes or other aggravating or alleviating factors except as outlined above   Current Medications, Allergies, Complete Past Medical History, Past Surgical History, Family History, and Social History were reviewed in [employer] record.  ROS  The following are not active complaints unless bolded sore throat, dysphagia, dental problems, itching, sneezing,  nasal congestion or excess/ purulent secretions, ear ache,   fever, chills, sweats, unintended wt loss, classically pleuritic or exertional cp,   orthopnea pnd or leg swelling, presyncope, palpitations, abdominal pain, anorexia, nausea, vomiting, diarrhea  or change in bowel or bladder habits, change in stools or urine, dysuria,hematuria,  rash, arthralgias, visual complaints, headache, numbness, weakness or ataxia or problems with walking or coordination,  change in mood/affect or memory.           Review of Systems  Constitutional: Negative for fever and unexpected weight change.  HENT: Positive for postnasal drip, sore throat and trouble swallowing. Negative for congestion, dental problem, ear pain, nosebleeds, rhinorrhea, sinus pressure and sneezing.   Eyes: Negative for redness and itching.  Respiratory: Positive for cough, shortness of breath and wheezing. Negative for chest tightness.   Cardiovascular: Negative for palpitations and leg swelling.  Gastrointestinal: Negative for nausea and vomiting.  Genitourinary: Negative for dysuria.  Musculoskeletal: Negative for joint swelling.  Skin: Negative for rash.  Neurological: Negative for headaches.  Hematological: Does not bruise/bleed easily.  Psychiatric/Behavioral: Negative for dysphoric mood. The patient is not nervous/anxious.        Objective:   Physical Exam  Hoarse amb wf   Wt Readings from Last 3 Encounters:  11/09/16 160 lb (72.6 kg)  05/01/12 170 lb 4 oz (77.2 kg)    Vital signs reviewed - Note on arrival 02 sats  100% on RA    HEENT: nl dentition, turbinates, and oropharynx. Nl external ear canals without cough reflex   NECK :  without JVD/Nodes/TM/ nl carotid upstrokes bilaterally   LUNGS: no acc muscle  use,  Nl contour chest which is clear to A and P bilaterally without cough on insp or exp maneuvers   CV:  RRR  no s3 or murmur or increase in P2, nad no edema   ABD:  soft and nontender with nl inspiratory excursion in the supine position. No bruits or organomegaly appreciated, bowel sounds nl  MS:  Nl gait/ ext warm without deformities, calf  tenderness, cyanosis or clubbing No obvious joint restrictions   SKIN: warm and dry without lesions    NEURO:  alert, approp, nl sensorium with  no motor or cerebellar deficits apparent.     I personally reviewed images and agree with radiology impression as follows:  CT with constrast Chest  11/01/16 1.8 cm spiculated lesion in the anteromedial left upper lung and a poorly defined 3.7 cm lesion in the mediastinal prevascular space. 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       Assessment & Plan:

## 2016-11-09 NOTE — Patient Instructions (Addendum)
Please see patient coordinator before you leave today  to schedule PET - I will call you with results as soon as I get them and we'll go from there   Pantoprazole (protonix) 40 mg   Take 30- 60 min before your first and last meals of the day   GERD (REFLUX)  is an extremely common cause of respiratory symptoms just like yours , many times with no obvious heartburn at all.    It can be treated with medication, but also with lifestyle changes including elevation of the head of your bed (ideally with 6 inch  bed blocks),  Smoking cessation, avoidance of late meals, excessive alcohol, and avoid fatty foods, chocolate, peppermint, colas, red wine, and acidic juices such as orange juice.  NO MINT OR MENTHOL PRODUCTS SO NO COUGH DROPS  USE SUGARLESS CANDY INSTEAD (Jolley ranchers or Stover's or Life Savers) or even ice chips will also do - the key is to swallow to prevent all throat clearing. NO OIL BASED VITAMINS - use powdered substitutes.

## 2016-11-09 NOTE — Telephone Encounter (Signed)
Patient returned phone call.Karen Vasquez ° °

## 2016-11-10 DIAGNOSIS — J3801 Paralysis of vocal cords and larynx, unilateral: Secondary | ICD-10-CM | POA: Insufficient documentation

## 2016-11-10 NOTE — Assessment & Plan Note (Addendum)
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 There are several nodes as well as likely primary in the LUL but the best way to sort out where to bx is the PET which gives the best/ most practical target.   Discussed in detail all the  indications, usual  risks and alternatives  relative to the benefits with patient who agrees to proceed with w/u with PET first then decide on what bx to do.   I emphasized that tissue dx is critical here and can't say anything at this point about treatment. I didn't specifically tell here that this lesion in not resectable but in all likelihood this is the case and ? If small cell wouldn't fit the best with this pattern (and it's not typically resected anyway)  Total time devoted to counseling  > 50 % of a 75 m office visit:  review case/images  with pt/daughter discussion of options/alternatives/ personally creating written customized instructions  in presence of pt  then going over those specific  Instructions directly with the pt including how to use all of the meds but in particular covering each new medication in detail and the difference between the maintenance/automatic meds and the prns using an action plan format for the latter.  Please see AVS from this visit for a full list of these instructions

## 2016-11-10 NOTE — Assessment & Plan Note (Signed)
Ironically this causing most if not all her symptoms which are upper airway in nature   rec max rx for gerd/ gerd diet which may help some of the irritation of the upper aiway

## 2016-11-15 MED ORDER — ALPRAZOLAM 0.5 MG PO TABS: ORAL_TABLET | ORAL | 0 refills | Status: DC

## 2016-11-15 NOTE — Telephone Encounter (Signed)
Called and spoke with pt and she is aware of MW recs.  Med has been called in and nothing further is needed.

## 2016-11-15 NOTE — Telephone Encounter (Signed)
Pt calling a/b rx that she needs fior a procedure she is to have on tomorrow please advise.Karen Vasquez

## 2016-11-15 NOTE — Telephone Encounter (Signed)
Xanax 0.5 mg x one, ok to repeat x 1 prn so give #2

## 2016-11-15 NOTE — Telephone Encounter (Signed)
MW  Please advise-  Pt. Called and stated she is having a PET scan thurs. In the am and she wanted to know if something could be called in for claustrophobia. She is aware that you would not be in the office until this afternoon.

## 2016-11-17 ENCOUNTER — Ambulatory Visit
Admission: RE | Admit: 2016-11-17 | Discharge: 2016-11-17 | Disposition: A | Discharge status: Home / Self Care | Payer: BLUE CROSS/BLUE SHIELD | Source: Ambulatory Visit | Attending: Internal Medicine | Admitting: Internal Medicine

## 2016-11-17 DIAGNOSIS — C801 Malignant (primary) neoplasm, unspecified: Secondary | ICD-10-CM | POA: Diagnosis not present

## 2016-11-17 DIAGNOSIS — E041 Nontoxic single thyroid nodule: Secondary | ICD-10-CM | POA: Diagnosis not present

## 2016-11-17 DIAGNOSIS — J3801 Paralysis of vocal cords and larynx, unilateral: Secondary | ICD-10-CM | POA: Diagnosis not present

## 2016-11-17 DIAGNOSIS — R918 Other nonspecific abnormal finding of lung field: Secondary | ICD-10-CM | POA: Diagnosis not present

## 2016-11-17 LAB — GLUCOSE, CAPILLARY: Glucose-Capillary: 103 mg/dL — ABNORMAL HIGH (ref 65–99)

## 2016-11-17 MED ORDER — FLUDEOXYGLUCOSE F - 18 (FDG) INJECTION
12.2700 | Freq: Once | INTRAVENOUS | Status: AC | PRN
  Administered 2016-11-17: 12.27 via INTRAVENOUS

## 2016-11-18 ENCOUNTER — Other Ambulatory Visit: Payer: Self-pay | Admitting: Internal Medicine

## 2016-11-18 ENCOUNTER — Other Ambulatory Visit: Payer: Self-pay | Admitting: Physician Assistant

## 2016-11-18 ENCOUNTER — Encounter: Payer: Self-pay | Admitting: Internal Medicine

## 2016-11-18 ENCOUNTER — Other Ambulatory Visit: Payer: Self-pay | Admitting: Radiology

## 2016-11-18 DIAGNOSIS — R160 Hepatomegaly, not elsewhere classified: Secondary | ICD-10-CM

## 2016-11-18 DIAGNOSIS — C787 Secondary malignant neoplasm of liver and intrahepatic bile duct: Secondary | ICD-10-CM | POA: Insufficient documentation

## 2016-11-18 DIAGNOSIS — R918 Other nonspecific abnormal finding of lung field: Secondary | ICD-10-CM

## 2016-11-18 NOTE — Progress Notes (Signed)
Discussed with pt/ [place] > liver bx  Discussed in detail all the  indications, usual  risks and alternatives  relative to the benefits with patient who agrees to proceed with  FNA liver bx and hold asa x 3 days before

## 2016-11-21 ENCOUNTER — Encounter (HOSPITAL_COMMUNITY): Payer: Self-pay

## 2016-11-21 ENCOUNTER — Ambulatory Visit (HOSPITAL_COMMUNITY)
Admission: RE | Admit: 2016-11-21 | Discharge: 2016-11-21 | Disposition: A | Discharge status: Home / Self Care | Payer: BLUE CROSS/BLUE SHIELD | Source: Ambulatory Visit | Attending: Internal Medicine | Admitting: Internal Medicine

## 2016-11-21 ENCOUNTER — Other Ambulatory Visit: Payer: Self-pay | Admitting: Radiology

## 2016-11-21 DIAGNOSIS — Z79899 Other long term (current) drug therapy: Secondary | ICD-10-CM | POA: Insufficient documentation

## 2016-11-21 DIAGNOSIS — K219 Gastro-esophageal reflux disease without esophagitis: Secondary | ICD-10-CM | POA: Insufficient documentation

## 2016-11-21 DIAGNOSIS — R160 Hepatomegaly, not elsewhere classified: Secondary | ICD-10-CM

## 2016-11-21 DIAGNOSIS — K760 Fatty (change of) liver, not elsewhere classified: Secondary | ICD-10-CM | POA: Diagnosis not present

## 2016-11-21 DIAGNOSIS — Z7982 Long term (current) use of aspirin: Secondary | ICD-10-CM | POA: Insufficient documentation

## 2016-11-21 LAB — PROTIME-INR
INR: 1.12
Prothrombin Time: 14.4 seconds (ref 11.4–15.2)

## 2016-11-21 LAB — CBC
HEMATOCRIT: 38.8 % (ref 36.0–46.0)
Hemoglobin: 13.4 g/dL (ref 12.0–15.0)
MCH: 30 pg (ref 26.0–34.0)
MCHC: 34.5 g/dL (ref 30.0–36.0)
MCV: 86.8 fL (ref 78.0–100.0)
Platelets: 331 10*3/uL (ref 150–400)
RBC: 4.47 MIL/uL (ref 3.87–5.11)
RDW: 13.8 % (ref 11.5–15.5)
WBC: 11.2 10*3/uL — ABNORMAL HIGH (ref 4.0–10.5)

## 2016-11-21 LAB — APTT: aPTT: 31 seconds (ref 24–36)

## 2016-11-21 MED ORDER — SODIUM CHLORIDE 0.9 % IV SOLN
INTRAVENOUS | Status: DC
  Administered 2016-11-21: 12:00:00 via INTRAVENOUS

## 2016-11-21 MED ORDER — FENTANYL CITRATE (PF) 100 MCG/2ML IJ SOLN
INTRAMUSCULAR | Status: AC | PRN
  Administered 2016-11-21 (×2): 50 ug via INTRAVENOUS

## 2016-11-21 MED ORDER — LIDOCAINE HCL 1 % IJ SOLN
INTRAMUSCULAR | Status: AC
  Filled 2016-11-21: qty 20

## 2016-11-21 MED ORDER — GELATIN ABSORBABLE 12-7 MM EX MISC
CUTANEOUS | Status: AC
  Filled 2016-11-21: qty 1

## 2016-11-21 MED ORDER — MIDAZOLAM HCL 2 MG/2ML IJ SOLN
INTRAMUSCULAR | Status: AC
  Filled 2016-11-21: qty 2

## 2016-11-21 MED ORDER — FENTANYL CITRATE (PF) 100 MCG/2ML IJ SOLN
INTRAMUSCULAR | Status: AC
  Filled 2016-11-21: qty 2

## 2016-11-21 NOTE — Sedation Documentation (Signed)
Patient is resting comfortably. 

## 2016-11-21 NOTE — Sedation Documentation (Signed)
Patient denies pain and is resting comfortably.  

## 2016-11-21 NOTE — H&P (Signed)
Chief Complaint: Patient was seen in consultation today for liver lesion biopsy at the request of Wert,Michael B  Referring Physician(s): Wert,Michael B  Supervising Physician: Marybelle Killings  Patient Status: [facility] - Out-pt  History of Present Illness: Karen Vasquez is a 66 y.o. female   Pt developed hard cough and "laryngitis" in Dec 11/2015 Was seen by MD and referred to Dr Melvyn Novas with findings of lung mass/mediastianl mass Further work up included CT Chest: 11/01/16: IMPRESSION: 1.8 cm spiculated lesion in the anteromedial left upper lung and a poorly defined 3.7 cm lesion in the mediastinal prevascular space. 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  PET 11/17/16: IMPRESSION: 1. Widespread FDG avid malignancy. 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 2. Left vocal cord paralysis. 3. The mildly avid left thyroid nodule is nonspecific. Ultrasound could better evaluate.  Now scheduled for biopsy of liver lesion per Dr Melvyn Novas.   Past Medical History:  Diagnosis Date  . Arthritis   . Asthma due to seasonal allergies   . GERD (gastroesophageal reflux disease)   . Hernia of abdominal wall    umbilical  . No pertinent past medical history    COLD UPPER RESP STARTED ZPAK 6/14  . Recurrent upper respiratory infection (URI)    recent sinus inf  . Thyroid nodule   . Urine blood    hematuria    Past Surgical History:  Procedure Laterality Date  . ABDOMINAL HYSTERECTOMY  08   vag, rectocele repair , bladder sling  . ANTERIOR CERVICAL DECOMP/DISCECTOMY FUSION  05/09/2012   Procedure: ANTERIOR CERVICAL  DECOMPRESSION/DISCECTOMY FUSION 1 LEVEL;  Surgeon: Eustace Moore, MD;  Location: [facility];  Service: Neurosurgery;  Laterality: N/A;  anterior cervical decompression fusion cervical five-six  . BREAST CYST EXCISION  96   fibroadenoma lft  . BREAST SURGERY     03  . CARPAL TUNNEL RELEASE  05   bil  . DILATION AND CURETTAGE OF UTERUS  03    polyps  . TUBAL LIGATION  88    Allergies: Prednisone; Fenofibrate; Quinolones; Statins; and Septra [sulfamethoxazole-trimethoprim]  Medications: Prior to Admission medications   Medication Sig Start Date End Date Taking? Authorizing Provider  acetaminophen (TYLENOL) 500 MG tablet Take 500 mg by mouth every 6 (six) hours as needed. occ 650 2 twice a day   Yes Historical Provider, MD  ALPRAZolam Duanne Moron) 0.5 MG tablet Take 1 tablet 30-45 mins prior to procedure and may repeat x 1. 11/15/16  Yes Tanda Rockers, MD  Ascorbic Acid (VITAMIN C) 1000 MG tablet Take 1,000 mg by mouth 2 (two) times daily.    Yes Historical Provider, MD  aspirin 81 MG tablet Take 81 mg by mouth daily.    Yes Historical Provider, MD  azelastine (ASTELIN) 137 MCG/SPRAY nasal spray Place 1 spray into the nose as needed. Use in each nostril as directed   Yes Historical Provider, MD  Calcium Citrate-Vitamin D (CALCIUM CITRATE + D3 PO) Take 2 tablets by mouth 2 (two) times daily. With magnesium   Yes Historical Provider, MD  Cholecalciferol (VITAMIN D) 2000 units tablet Take 2,000 Units by mouth daily.   Yes Historical Provider, MD  conjugated estrogens (PREMARIN) vaginal cream Place 1 Applicatorful vaginally daily.   Yes Historical Provider, MD  Cranberry 500 MG TABS Take 1 tablet by mouth 2 (two) times daily.   Yes Historical Provider, MD  ezetimibe (ZETIA) 10 MG tablet Take 10 mg by mouth daily.   Yes Historical Provider, MD  fexofenadine (ALLEGRA) 180 MG tablet Take 180 mg by mouth daily.   Yes Historical Provider, MD  ketotifen (ZADITOR) 0.025 % ophthalmic solution Place 1 drop  into both eyes 2 (two) times daily.   Yes Historical Provider, MD  Multiple Vitamin (MULTIVITAMIN) tablet Take 1 tablet by mouth daily.   Yes Historical Provider, MD  pantoprazole (PROTONIX) 40 MG tablet Take 40 mg by mouth 2 (two) times daily.    Yes Historical Provider, MD  raloxifene (EVISTA) 60 MG tablet Take 60 mg by mouth daily.   Yes Historical Provider, MD  sodium chloride (OCEAN) 0.65 % nasal spray Place 1 spray into the nose as needed.   Yes Historical Provider, MD  traMADol (ULTRAM) 50 MG tablet Take 50 mg by mouth every 12 (twelve) hours as needed.    Yes Historical Provider, MD  trimethoprim (TRIMPEX) 100 MG tablet Take 100 mg by mouth 2 (two) times daily.   Yes Historical Provider, MD  calcipotriene-betamethasone (TACLONEX) ointment Apply 1 application topically daily. Given samples from Dr office    Historical Provider, MD     History reviewed. No pertinent family history.  Social History   Social History  . Marital status: Married    Spouse name: N/A  . Number of children: N/A  . Years of education: N/A   Social History Main Topics  . Smoking status: Never Smoker  . Smokeless tobacco: None  . Alcohol use No  . Drug use: No  . Sexual activity: Not Asked   Other Topics Concern  . None   Social History Narrative  . None    Review of Systems: A 12 point ROS discussed and pertinent positives are indicated in the HPI above.  All other systems are negative.  Review of Systems  Constitutional: Negative for activity change, appetite change, fatigue, fever and unexpected weight change.  HENT: Positive for voice change. Negative for trouble swallowing.   Respiratory: Positive for cough. Negative for shortness of breath.   Gastrointestinal: Negative for abdominal pain.  Neurological: Negative for weakness.  Psychiatric/Behavioral: Negative for behavioral problems and confusion.    Vital Signs: BP (!) 103/55   Pulse 86   Temp 98.3 F (36.8 C) (Oral)   Resp 18    Ht 5' 4.5" (1.638 m)   Wt 155 lb (70.3 kg)   SpO2 100%   BMI 26.19 kg/m   Physical Exam  Constitutional: She is oriented to person, place, and time.  Cardiovascular: Normal rate, regular rhythm and normal heart sounds.   Pulmonary/Chest: Effort normal and breath sounds normal. She has no wheezes.  Abdominal: Soft. Bowel sounds are normal. There is no tenderness.  Musculoskeletal: Normal range of motion.  Neurological: She is alert and oriented to person, place, and time.  Skin: Skin is warm and dry.  Psychiatric: She has a normal mood and affect. Her behavior is normal. Judgment and thought content normal.  Nursing note and vitals reviewed.   Mallampati Score:  MD Evaluation Airway: WNL Heart: WNL Abdomen: WNL Chest/ Lungs: WNL ASA  Classification: 3 Mallampati/Airway Score: One  Imaging: Ct Soft Tissue Neck W Contrast  Result Date: 11/01/2016 CLINICAL DATA:  Vocal cord paralysis  and cough Creatinine was obtained on site at [place] at [address] Results: Creatinine 0.8 mg/dL. EXAM: CT NECK WITH CONTRAST TECHNIQUE: Multidetector CT imaging of the neck was performed using the standard protocol following the bolus administration of intravenous contrast. CONTRAST:  16m ISOVUE-300 IOPAMIDOL (ISOVUE-300) INJECTION 61% COMPARISON:  None. FINDINGS: Pharynx and larynx: The nasopharynx is clear. The oropharynx and hypopharynx are normal. The epiglottis, supraglottic larynx, glottis and subglottic larynx are normal. No retropharyngeal collection. The parapharyngeal spaces are preserved. The visible oral cavity and base of tongue are normal. The true vocal cords are symmetric. There is mild asymmetric dilatation of the right piriform sinus. No medialization of either aryepiglottic fold. Salivary glands: The parotid and submandibular glands are normal. No sialolithiasis or salivary ductal dilatation. Thyroid: Normal Lymph nodes: There are no enlarged or abnormal density  cervical lymph nodes. Vascular: There is mild aortic atherosclerosis. Visualized major cervical vessels are normal. Limited intracranial: Normal Visualized orbits: Bilateral lens replacements. Mastoids and visualized paranasal sinuses: Clear Skeleton: ACDF at C5-C6 with osseous fusion. Multilevel facet hypertrophy. No bony spinal canal stenosis. No lytic or blastic osseous lesions. Upper chest: There is a heterogeneous mass within the prevascular space of the upper mediastinum that is incompletely visualized on this study but measures up to 3.2 x 4.1 centimeters. The remainder of the chest is more completely characterized on the concomitant chest CT. Please see dedicated report. Other: None IMPRESSION: 1. Upper mediastinal mass within the prevascular space and in the expected location of the left recurrent laryngeal nerve, in keeping with reported vocal cord paralysis. This mass is incompletely visualized on the CT of the neck pain is more completely characterized on the concomitant chest CT. Please see the dedicated report for that study. 2. No cervical lymphadenopathy. Electronically Signed   By: KUlyses JarredM.D.   On: 11/01/2016 16:12   Ct Chest W Contrast  Result Date: 11/01/2016 CLINICAL DATA:  Left vocal cord paralysis for 1 week.  Cough. EXAM: CT CHEST WITH CONTRAST TECHNIQUE: Multidetector CT imaging of the chest was performed during intravenous contrast administration. CONTRAST:  1560mISOVUE-300 IOPAMIDOL (ISOVUE-300) INJECTION 61% COMPARISON:  Neck CT 11/01/2016 an abdominal CT 01/25/2013 FINDINGS: Cardiovascular: Mild atherosclerotic disease at the aortic arch without enlargement or dissection. The main pulmonary arteries are patent. There is compression and narrowing of a left upper lobe pulmonary artery on sequence 3, image 51. This compression is due to the mediastinal soft tissue mass in this area. No evidence for large or central pulmonary embolism. No significant pericardial fluid.  Mediastinum/Nodes: There is a soft tissue mass on the left side of mediastinum in the prevascular space. This lesion is poorly defined and could represent a conglomeration of nodes, roughly measuring 3.3 x 3.7 x 3.6 cm. No significant lymph node enlargement in the supraclavicular regions. Right paratracheal lymph node measures 1.1 cm on sequence 3, image 42. No significant subcarinal lymphadenopathy. Small hiatal hernia. No significant axillary lymphadenopathy. No significant right hilar lymphadenopathy. Lungs/Pleura: The trachea and mainstem bronchi are patent. Minimal scarring at the lung apices. There is a mildly spiculated lesion in the anteromedial left upper lobe. This lesion has a small pleural-based component and the lesion measures 1.8 x 1.7 x 1.4 cm. There is no significant pleural fluid. Upper Abdomen: There is a new low-density lesion at the hepatic dome measuring up to 3.3 cm. Cannot exclude hepatic steatosis. There appears to be new bilateral adrenal lesions. Left adrenal lesion measures up to 3.1 cm. Right  adrenal lesion or nodule measures 1.6 cm cm. There may have been a small right adrenal nodule in a similar location on the previous Chest CT examination. Musculoskeletal: Degenerative disc and endplate changes in the thoracic spine. No suspicious bone lesion in the chest. IMPRESSION: 1.8 cm spiculated lesion in the anteromedial left upper lung and a poorly defined 3.7 cm lesion in the mediastinal prevascular space. 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 These results will be called to the ordering clinician or representative by the Radiologist Assistant, and communication documented in the PACS or zVision Dashboard. Electronically Signed   By: Markus Daft M.D.   On: 11/01/2016 19:12   Nm Pet Image Initial (pi)  Skull Base To Thigh  Result Date: 11/17/2016 CLINICAL DATA:  Lung mass.  Staging. EXAM: NUCLEAR MEDICINE PET SKULL BASE TO THIGH TECHNIQUE: 12.27 mCi F-18 FDG was injected intravenously. Full-ring PET imaging was performed from the skull base to thigh after the radiotracer. CT data was obtained and used for attenuation correction and anatomic localization. FASTING BLOOD GLUCOSE:  Value: 103 mg/dl COMPARISON:  November 01, 2016 chest CT FINDINGS: NECK Increased uptake in the right vocal cord is consistent with left vocal cord paralysis. There is a nodule in the inferior left thyroid lobe with gross pleural calcification. The nodule measures approximately 6.6 mm on series 4, image 61 with a maximum SUV of 3.2. No other abnormal uptake in the neck. CHEST The spiculated nodule in the anterior left upper lobe measures 1.8 x 1.7 x 1.4 cm on the CT of the chest November 01, 2016 without significant interval change given difference in slice selection. This nodule demonstrates a maximum SUV of 4. Metastatic disease is identified within the mediastinum, both to the right and left of midline. The AP window metastatic lesion better identified on the recent CT scan measured 3.3 x 3.7 x 3.6 cm at that time without significant change given difference in technique. The maximum SUV within this metastasis is 11.1. FDG avid metastatic disease is also seen in the prevascular space, within a right paratracheal node, and in the subcarinal region. Low level uptake in the bilateral hila, right greater than left is nonspecific. Recommend attention on follow-up. The maximum SUV in the right hilum is 2.7. ABDOMEN/PELVIS The mass in the hepatic dome on the recent CT scan is consistent with a metastatic lesion. Again, this mass was better seen on the recent contrast-enhanced CT scan measuring up to 3.3 cm at that time. The maximum associated SUV is 7.5. There is mild increased uptake in the liver surrounding the mass, particularly medially and  inferiorly consistent with extension. No other discrete FDG avid disease seen within the liver. There is a metastatic node in the anterior abdomen just deep to the chest wall on series 4, image 131 with abnormal FDG uptake. This node measures 13 x 10 mm with a maximum SUV of 8.1. A portacaval node demonstrates mild increased FDG uptake, also suspicious. There are at least 2 adjacent mesenteric nodes anterior to the pancreatic head which are FDG avid and suspicious. There is a small avid noted just posterior to the right renal vessels on series 4, image 143. An FDG avid node is seen to the right of midline, just inferior to the duodenum on series 4, image 172. FDG avid adrenal metastases are also seen, left larger  than right. The left adrenal metastasis measures 3.2 by 3.1 cm on series 4, image 134 with a maximum SUV of 12.8. SKELETON Bony metastatic disease involves the T6 vertebral body, the left side of the sacrum, the right sacrum, the right femur, and at least 2 ribs. IMPRESSION: 1. Widespread FDG avid malignancy. 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 2. Left vocal cord paralysis. 3. The mildly avid left thyroid nodule is nonspecific. Ultrasound could better evaluate. Electronically Signed   By: Dorise Bullion III M.D   On: 11/17/2016 13:44    Labs:  CBC:  Recent Labs  11/21/16 1122  WBC 11.2*  HGB 13.4  HCT 38.8  PLT 331    COAGS:  Recent Labs  11/21/16 1122  INR 1.12  APTT 31    BMP: No results for input(s): NA, K, CL, CO2, GLUCOSE, BUN, CALCIUM, CREATININE, GFRNONAA, GFRAA in the last 8760 hours.  Invalid input(s): CMP  LIVER FUNCTION TESTS: No results for input(s): BILITOT, AST, ALT, ALKPHOS, PROT, ALBUMIN in the last 8760 hours.  TUMOR MARKERS: No results for input(s): AFPTM, CEA, CA199,  CHROMGRNA in the last 8760 hours.  Assessment and Plan:  Cough and hoarse voice since Dec 2017 Work up shows lung mass; mediastinal mass; liver lesion; bony lesions; LAN Scheduled today for liver lesion biopsy Risks and Benefits discussed with the patient including, but not limited to bleeding, infection, damage to adjacent structures or low yield requiring additional tests. All of the patient's questions were answered, patient is agreeable to proceed. Consent signed and in chart.   Thank you for this interesting consult.  I greatly enjoyed meeting Karen Vasquez and look forward to participating in their care.  A copy of this report was sent to the requesting provider on this date.  Electronically Signed: Monia Sabal A 11/21/2016, 1:26 PM   I spent a total of  30 Minutes   in face to face in clinical consultation, greater than 50% of which was counseling/coordinating care for liver lesion bx

## 2016-11-21 NOTE — Discharge Instructions (Signed)
Liver Biopsy, Care After °Introduction °These instructions give you information on caring for yourself after your procedure. Your doctor may also give you more specific instructions. Call your doctor if you have any problems or questions after your procedure. °Follow these instructions at home: °· Rest at home for 1-2 days or as told by your doctor. °· Have someone stay with you for at least 24 hours. °· Do not do these things in the first 24 hours: °¨ Drive. °¨ Use machinery. °¨ Take care of other people. °¨ Sign legal documents. °¨ Take a bath or shower. °· There are many different ways to close and cover a cut (incision). For example, a cut can be closed with stitches, skin glue, or adhesive strips. Follow your doctor's instructions on: °¨ Taking care of your cut. °¨ Changing and removing your bandage (dressing). °¨ Removing whatever was used to close your cut. °· Do not drink alcohol in the first week. °· Do not lift more than 5 pounds or play contact sports for the first 2 weeks. °· Take medicines only as told by your doctor. For 1 week, do not take medicine that has aspirin in it or medicines like ibuprofen. °· Get your test results. °Contact a doctor if: °· A cut bleeds and leaves more than just a small spot of blood. °· A cut is red, puffs up (swells), or hurts more than before. °· Fluid or something else comes from a cut. °· A cut smells bad. °· You have a fever or chills. °Get help right away if: °· You have swelling, bloating, or pain in your belly (abdomen). °· You get dizzy or faint. °· You have a rash. °· You feel sick to your stomach (nauseous) or throw up (vomit). °· You have trouble breathing, feel short of breath, or feel faint. °· Your chest hurts. °· You have problems talking or seeing. °· You have trouble balancing or moving your arms or legs. °This information is not intended to replace advice given to you by your health care provider. Make sure you discuss any questions you have with your  health care provider. °Document Released: 08/09/2008 Document Revised: 04/07/2016 Document Reviewed: 12/27/2013 °© 2017 Elsevier ° °

## 2016-11-21 NOTE — Procedures (Signed)
R lobe liver lesion Bx 18 g times three No comp/EBL

## 2016-11-21 NOTE — Sedation Documentation (Signed)
Procedure discomfort.

## 2016-11-24 ENCOUNTER — Telehealth: Payer: Self-pay | Admitting: Internal Medicine

## 2016-11-24 NOTE — Telephone Encounter (Signed)
Please advise thanks.

## 2016-11-25 ENCOUNTER — Telehealth: Payer: Self-pay | Admitting: *Deleted

## 2016-11-25 NOTE — Telephone Encounter (Signed)
Discussed with IR/ pt > refer for EBUS

## 2016-11-25 NOTE — Telephone Encounter (Signed)
I sent Dr Lake Bells a copy of record and talked to pt and she's good to go if he agrees so just have his nurse ask if can go ahead and schedule s OV first and if the wait is over 2 weeks we need to refer to someone who can see her sooner and if no one here then refer to [facility]

## 2016-11-25 NOTE — Telephone Encounter (Signed)
D McQuaid please advise thanks

## 2016-11-25 NOTE — Telephone Encounter (Signed)
Dr Melvyn Novas- no opening with BQ until 12/30/16  Would this be ok?

## 2016-11-25 NOTE — Telephone Encounter (Signed)
-----   Message from Tanda Rockers, MD sent at 11/24/2016  5:27 PM EST ----- Call patient :  Refer to Dr Lake Bells for ebus asap

## 2016-11-27 NOTE — Telephone Encounter (Signed)
I'm happy to see her this week if an opening develops in my schedule.

## 2016-11-28 ENCOUNTER — Telehealth: Payer: Self-pay | Admitting: *Deleted

## 2016-11-28 ENCOUNTER — Telehealth: Payer: Self-pay | Admitting: Internal Medicine

## 2016-11-28 ENCOUNTER — Encounter: Payer: Self-pay | Admitting: *Deleted

## 2016-11-28 DIAGNOSIS — R918 Other nonspecific abnormal finding of lung field: Secondary | ICD-10-CM

## 2016-11-28 NOTE — Telephone Encounter (Signed)
See other phone note dated 11/28/16  US biopsy ordered through IR

## 2016-11-28 NOTE — Telephone Encounter (Signed)
BQ  Please Advise-  Looking at your schedule you are completely booked this week

## 2016-11-28 NOTE — Telephone Encounter (Signed)
Spoke with the pt  She had some questions for MW about lung bx  Pt transferred to [facility]

## 2016-11-28 NOTE — Telephone Encounter (Signed)
Discussed in detail all the  indications, usual  risks and alternatives  relative to the benefits with patient who agrees to proceed with bx of subxiphoid lesion next as per Dr Art Hoss's rec

## 2016-11-28 NOTE — Telephone Encounter (Signed)
How about 12:00 tomorrow?

## 2016-11-28 NOTE — Telephone Encounter (Signed)
Spoke with the pt and notified of recs per MW  She verbalized understanding and is okay with this plan  Order sent to [facility]

## 2016-11-28 NOTE — Telephone Encounter (Signed)
-----   Message from Tanda Rockers, MD sent at 11/28/2016  8:45 AM EST ----- Regarding: FW: Karen Vasquez, S : Sex: F, D.O.B: 11-13-1951, PID: [id number], Procedure Code: 11173.5, Referring Physician: Christinia Gully, B Unless pt has ebus by end of week already scheduled, then let's ask her to return to IR this week at [facility]  U/s biopsy (IR reviewed and feels this is much easier than putting her thru but neither is 100% guarantee  ----- Message ----- From: Marybelle Killings, MD Sent: 11/28/2016   8:32 AM To: Tanda Rockers, MD Subject: Karen Vasquez, S : Sex: F, D.O.B: November 18, 1950, [name];  The liver biopsy was negative. I'm not sure if it was just too necrotic or was missed because the lesion was too high and close to the dome. There is a subxiphoid lesion that should be easy to biopsy if she is willing to undergo a second try.  Art

## 2016-12-02 ENCOUNTER — Telehealth: Payer: Self-pay | Admitting: Internal Medicine

## 2016-12-02 DIAGNOSIS — R918 Other nonspecific abnormal finding of lung field: Secondary | ICD-10-CM

## 2016-12-02 NOTE — Telephone Encounter (Signed)
U/s bx of sub xiphoid mass vs node (Dr Art Hoss is the IR who rec)

## 2016-12-02 NOTE — Telephone Encounter (Signed)
Spoke with pt. States that an order needs to be placed for biopsy to be done on her subxiphoid process. The only order that is currently in the system is for the lung biopsy.  MW - please advise. Thanks.

## 2016-12-02 NOTE — Telephone Encounter (Signed)
Order has been placed. Attempted to contact pt. Line was busy. Will try back.

## 2016-12-05 NOTE — Telephone Encounter (Signed)
Called and spoke with pt and she is aware of the order that has been placed for the [place].  Nothing further is needed.

## 2016-12-06 ENCOUNTER — Telehealth: Payer: Self-pay | Admitting: Internal Medicine

## 2016-12-06 NOTE — Telephone Encounter (Signed)
Called and spoke with pt and she is aware of appt with IR for the biopsy on 1/25.  Nothing further is needed.

## 2016-12-07 ENCOUNTER — Other Ambulatory Visit: Payer: Self-pay | Admitting: General Surgery

## 2016-12-08 ENCOUNTER — Ambulatory Visit (HOSPITAL_COMMUNITY)
Admission: RE | Admit: 2016-12-08 | Discharge: 2016-12-08 | Disposition: A | Discharge status: Home / Self Care | Payer: BLUE CROSS/BLUE SHIELD | Source: Ambulatory Visit | Attending: Internal Medicine | Admitting: Internal Medicine

## 2016-12-08 ENCOUNTER — Encounter (HOSPITAL_COMMUNITY): Payer: Self-pay

## 2016-12-08 DIAGNOSIS — C3492 Malignant neoplasm of unspecified part of left bronchus or lung: Secondary | ICD-10-CM | POA: Diagnosis not present

## 2016-12-08 DIAGNOSIS — R222 Localized swelling, mass and lump, trunk: Secondary | ICD-10-CM | POA: Diagnosis not present

## 2016-12-08 DIAGNOSIS — C3490 Malignant neoplasm of unspecified part of unspecified bronchus or lung: Secondary | ICD-10-CM | POA: Diagnosis not present

## 2016-12-08 DIAGNOSIS — R918 Other nonspecific abnormal finding of lung field: Secondary | ICD-10-CM | POA: Diagnosis present

## 2016-12-08 DIAGNOSIS — C7989 Secondary malignant neoplasm of other specified sites: Secondary | ICD-10-CM | POA: Diagnosis not present

## 2016-12-08 LAB — CBC
HEMATOCRIT: 41 % (ref 36.0–46.0)
Hemoglobin: 13.7 g/dL (ref 12.0–15.0)
MCH: 29.5 pg (ref 26.0–34.0)
MCHC: 33.4 g/dL (ref 30.0–36.0)
MCV: 88.4 fL (ref 78.0–100.0)
PLATELETS: 355 10*3/uL (ref 150–400)
RBC: 4.64 MIL/uL (ref 3.87–5.11)
RDW: 14.1 % (ref 11.5–15.5)
WBC: 11.5 10*3/uL — ABNORMAL HIGH (ref 4.0–10.5)

## 2016-12-08 LAB — APTT: aPTT: 32 seconds (ref 24–36)

## 2016-12-08 LAB — PROTIME-INR
INR: 1.07
PROTHROMBIN TIME: 13.9 s (ref 11.4–15.2)

## 2016-12-08 MED ORDER — FENTANYL CITRATE (PF) 100 MCG/2ML IJ SOLN
INTRAMUSCULAR | Status: AC
  Filled 2016-12-08: qty 2

## 2016-12-08 MED ORDER — MIDAZOLAM HCL 2 MG/2ML IJ SOLN
INTRAMUSCULAR | Status: AC | PRN
  Administered 2016-12-08: 0.5 mg via INTRAVENOUS

## 2016-12-08 MED ORDER — FENTANYL CITRATE (PF) 100 MCG/2ML IJ SOLN
INTRAMUSCULAR | Status: AC | PRN
  Administered 2016-12-08: 25 ug via INTRAVENOUS

## 2016-12-08 MED ORDER — MIDAZOLAM HCL 2 MG/2ML IJ SOLN
INTRAMUSCULAR | Status: AC
  Filled 2016-12-08: qty 2

## 2016-12-08 MED ORDER — SODIUM CHLORIDE 0.9 % IV SOLN
INTRAVENOUS | Status: AC | PRN
  Administered 2016-12-08: 10 mL/h via INTRAVENOUS

## 2016-12-08 MED ORDER — LIDOCAINE HCL (PF) 1 % IJ SOLN
INTRAMUSCULAR | Status: AC
  Filled 2016-12-08: qty 10

## 2016-12-08 MED ORDER — SODIUM CHLORIDE 0.9 % IV SOLN: INTRAVENOUS | Status: DC

## 2016-12-08 MED ORDER — GELATIN ABSORBABLE 12-7 MM EX MISC
CUTANEOUS | Status: AC
  Filled 2016-12-08: qty 1

## 2016-12-08 NOTE — Procedures (Signed)
Interventional Radiology Procedure Note  Procedure: US guided biopsy of subxyphoid soft tissue mass/lymph node  Complications: None  Estimated Blood Loss: < 10 mL  Roughly 13 mm subxyphoid mass/node to left of midline sampled with 18 G core biopsy x 4.  Karen Vasquez. Kathlene Cote, M.D Pager:  [phone]

## 2016-12-08 NOTE — H&P (Signed)
Chief Complaint: Patient was seen in consultation today for subxyphoid mass biopsy at the request of Wert,Michael B  Referring Physician(s): Wert,Michael B  Supervising Physician: Aletta Edouard  Patient Status: [facility] - Out-pt  History of Present Illness: Karen Vasquez is a 66 y.o. female   Pt has had laryngitis type symptoms since Dec 2017 Worsened Seen by PMD and noted abnormal CXR Referred to Dr Melvyn Novas Further work up included CT Chest: 11/01/16: IMPRESSION: 1.8 cm spiculated lesion in the anteromedial left upper lung and a poorly defined 3.7 cm lesion in the mediastinal prevascular space. 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  PET 11/17/16: IMPRESSION: 1. Widespread FDG avid malignancy. 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 2. Left vocal cord paralysis. 3. The mildly avid left thyroid nodule is nonspecific. Ultrasound could better evaluate.  Liver lesion was biopsied 11/21/16: Diagnosis Liver, needle/core biopsy - BENIGN LIVER WITH STEATOSIS AND SCATTERED CHRONIC INFLAMMATION. - MASS LESIONAL TISSUE IS NOT IDENTIFIED. - SEE COMMENT. Microscopic Comment Sections demonstrate benign liver with approximately 10% steatosis. There is scattered portal and perivenular chronic inflammation. Given sampling of liver tissue adjacent to a mass lesion, the inflammatory changes are nonspecific. Most importantly, mass lesional tissue (tumor) is not identified.  Now scheduled for biopsy of subxyphoid lesion biopsy For tissue diagnosis   Past Medical History:  Diagnosis Date  . Arthritis   . Asthma due to  seasonal allergies   . GERD (gastroesophageal reflux disease)   . Hernia of abdominal wall    umbilical  . No pertinent past medical history    COLD UPPER RESP STARTED [facility] 6/14  . Recurrent upper respiratory infection (URI)    recent sinus inf  . Thyroid nodule   . Urine blood    hematuria    Past Surgical History:  Procedure Laterality Date  . ABDOMINAL HYSTERECTOMY  08   vag, rectocele repair , bladder sling  . ANTERIOR CERVICAL DECOMP/DISCECTOMY FUSION  05/09/2012   Procedure: ANTERIOR CERVICAL DECOMPRESSION/DISCECTOMY FUSION 1 LEVEL;  Surgeon: Eustace Moore, MD;  Location: [place] [facility];  Service: Neurosurgery;  Laterality: N/A;  anterior cervical decompression fusion cervical five-six  . BREAST CYST EXCISION  96   fibroadenoma lft  . BREAST SURGERY     03  . CARPAL TUNNEL RELEASE  05   bil  . DILATION AND CURETTAGE OF UTERUS  03    polyps  . TUBAL LIGATION  88    Allergies: Prednisone; Fenofibrate; Quinolones; Statins; and Septra [sulfamethoxazole-trimethoprim]  Medications: Prior to Admission medications   Medication Sig Start Date End Date Taking? Authorizing Provider  acetaminophen (TYLENOL) 650 MG CR tablet Take 1,300 mg by mouth at bedtime as needed for pain.   Yes Historical Provider, MD  Ascorbic Acid (VITAMIN C) 1000 MG tablet Take 1,000 mg by mouth daily.    Yes Historical Provider, MD  azelastine (ASTELIN) 137 MCG/SPRAY nasal spray Place 1 spray into the nose as needed. Use in each nostril as directed   Yes Historical Provider, MD  Calcium Citrate-Vitamin D (CALCIUM CITRATE + D3 PO) Take 2 tablets by mouth 2 (two) times daily. With magnesium   Yes Historical Provider, MD  Cholecalciferol (  VITAMIN D) 2000 units tablet Take 2,000 Units by mouth daily.   Yes Historical Provider, MD  conjugated estrogens (PREMARIN) vaginal cream Place 1 Applicatorful vaginally daily.   Yes Historical Provider, MD  Cranberry 500 MG TABS Take 1 tablet by mouth 2 (two) times daily.    Yes Historical Provider, MD  ezetimibe (ZETIA) 10 MG tablet Take 10 mg by mouth daily.   Yes Historical Provider, MD  ketotifen (ZADITOR) 0.025 % ophthalmic solution Place 1 drop into both eyes 2 (two) times daily.   Yes Historical Provider, MD  loratadine (CLARITIN) 10 MG tablet Take 10 mg by mouth daily.   Yes Historical Provider, MD  Multiple Vitamin (MULTIVITAMIN) tablet Take 1 tablet by mouth daily.   Yes Historical Provider, MD  pantoprazole (PROTONIX) 40 MG tablet Take 40 mg by mouth 2 (two) times daily.    Yes Historical Provider, MD  raloxifene (EVISTA) 60 MG tablet Take 60 mg by mouth daily.   Yes Historical Provider, MD  sodium chloride (OCEAN) 0.65 % nasal spray Place 1 spray into the nose daily as needed for congestion.    Yes Historical Provider, MD  traMADol (ULTRAM) 50 MG tablet Take 50 mg by mouth every 12 (twelve) hours as needed.    Yes Historical Provider, MD  trimethoprim (TRIMPEX) 100 MG tablet Take 100 mg by mouth daily.    Yes Historical Provider, MD  acetaminophen (TYLENOL) 500 MG tablet Take 500 mg by mouth every 6 (six) hours as needed. occ 650 2 twice a day    Historical Provider, MD  calcipotriene-betamethasone (TACLONEX) ointment Apply 1 application topically daily. Given samples from Dr office    Historical Provider, MD     History reviewed. No pertinent family history.  Social History   Social History  . Marital status: Married    Spouse name: N/A  . Number of children: N/A  . Years of education: N/A   Social History Main Topics  . Smoking status: Never Smoker  . Smokeless tobacco: None  . Alcohol use No  . Drug use: No  . Sexual activity: Not Asked   Other Topics Concern  . None   Social History Narrative  . None    Review of Systems: A 12 point ROS discussed and pertinent positives are indicated in the HPI above.  All other systems are negative.  Review of Systems  Constitutional: Negative for activity change, appetite change, fatigue and  fever.  Respiratory: Positive for cough.   Gastrointestinal: Negative for abdominal pain.  Psychiatric/Behavioral: Negative for behavioral problems and confusion.    Vital Signs: BP (!) 135/94   Pulse 88   Temp 98.4 F (36.9 C) (Oral)   Resp 18   Ht 5' 4.5" (1.638 m)   Wt 150 lb (68 kg)   SpO2 99%   BMI 25.35 kg/m   Physical Exam  Constitutional: She is oriented to person, place, and time. She appears well-nourished.  Cardiovascular: Normal rate and regular rhythm.   Pulmonary/Chest: Effort normal and breath sounds normal.  Abdominal: Soft. Bowel sounds are normal.  Musculoskeletal: Normal range of motion.  Neurological: She is alert and oriented to person, place, and time.  Skin: Skin is warm and dry.  Psychiatric: She has a normal mood and affect. Her behavior is normal. Judgment and thought content normal.  Nursing note and vitals reviewed.   Mallampati Score:  MD Evaluation Airway: WNL Heart: WNL Abdomen: WNL Chest/ Lungs: WNL ASA  Classification: 3 Mallampati/Airway Score: One  Imaging: Nm Pet Image Initial (pi) Skull Base To Thigh  Result Date: 11/17/2016 CLINICAL DATA:  Lung mass.  Staging. EXAM: NUCLEAR MEDICINE PET SKULL BASE TO THIGH TECHNIQUE: 12.27 mCi F-18 FDG was injected intravenously. Full-ring PET imaging was performed from the skull base to thigh after the radiotracer. CT data was obtained and used for attenuation correction and anatomic localization. FASTING BLOOD GLUCOSE:  Value: 103 mg/dl COMPARISON:  November 01, 2016 chest CT FINDINGS: NECK Increased uptake in the right vocal cord is consistent with left vocal cord paralysis. There is a nodule in the inferior left thyroid lobe with gross pleural calcification. The nodule measures approximately 6.6 mm on series 4, image 61 with a maximum SUV of 3.2. No other abnormal uptake in the neck. CHEST The spiculated nodule in the anterior left upper lobe measures 1.8 x 1.7 x 1.4 cm on the CT of the chest  November 01, 2016 without significant interval change given difference in slice selection. This nodule demonstrates a maximum SUV of 4. Metastatic disease is identified within the mediastinum, both to the right and left of midline. The AP window metastatic lesion better identified on the recent CT scan measured 3.3 x 3.7 x 3.6 cm at that time without significant change given difference in technique. The maximum SUV within this metastasis is 11.1. FDG avid metastatic disease is also seen in the prevascular space, within a right paratracheal node, and in the subcarinal region. Low level uptake in the bilateral hila, right greater than left is nonspecific. Recommend attention on follow-up. The maximum SUV in the right hilum is 2.7. ABDOMEN/PELVIS The mass in the hepatic dome on the recent CT scan is consistent with a metastatic lesion. Again, this mass was better seen on the recent contrast-enhanced CT scan measuring up to 3.3 cm at that time. The maximum associated SUV is 7.5. There is mild increased uptake in the liver surrounding the mass, particularly medially and inferiorly consistent with extension. No other discrete FDG avid disease seen within the liver. There is a metastatic node in the anterior abdomen just deep to the chest wall on series 4, image 131 with abnormal FDG uptake. This node measures 13 x 10 mm with a maximum SUV of 8.1. A portacaval node demonstrates mild increased FDG uptake, also suspicious. There are at least 2 adjacent mesenteric nodes anterior to the pancreatic head which are FDG avid and suspicious. There is a small avid noted just posterior to the right renal vessels on series 4, image 143. An FDG avid node is seen to the right of midline, just inferior to the duodenum on series 4, image 172. FDG avid adrenal metastases are also seen, left larger than right. The left adrenal metastasis measures 3.2 by 3.1 cm on series 4, image 134 with a maximum SUV of 12.8. SKELETON Bony metastatic  disease involves the T6 vertebral body, the left side of the sacrum, the right sacrum, the right femur, and at least 2 ribs. IMPRESSION: 1. Widespread FDG avid malignancy. 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 2. Left vocal cord paralysis. 3. The mildly avid left thyroid nodule is nonspecific. Ultrasound could better evaluate. Electronically Signed   By: Dorise Bullion III M.D   On: 11/17/2016 13:44   US Biopsy  Result Date: 11/21/2016 INDICATION: Lung mass.  Liver mass. EXAM: ULTRASOUND-GUIDED BIOPSY OF A  RIGHT LOBE LIVER MASS.  CORE. MEDICATIONS: None. ANESTHESIA/SEDATION: Fentanyl 100 mcg IV; Versed 2 mg IV Moderate Sedation Time:  10 The patient was continuously monitored during the procedure by the interventional radiology nurse under my direct supervision. FLUOROSCOPY TIME:  NONE. COMPLICATIONS: None immediate. PROCEDURE: Informed written consent was obtained from the patient after a thorough discussion of the procedural risks, benefits and alternatives. All questions were addressed. Maximal Sterile Barrier Technique was utilized including caps, mask, sterile gowns, sterile gloves, sterile drape, hand hygiene and skin antiseptic. A timeout was performed prior to the initiation of the procedure. The right flank was prepped with ChloraPrep in a sterile fashion, and a sterile drape was applied covering the operative field. A sterile gown and sterile gloves were used for the procedure. Under sonographic guidance, an 17 gauge guide needle was advanced into the right lobe liver lesion. Subsequently 3 18 gauge core biopsies were obtained. Gel-Foam slurry was injected into the needle tract. The guide needle was removed. Final imaging was performed. Patient tolerated the procedure well without complication. Vital sign monitoring by  nursing staff during the procedure will continue as patient is in the special procedures unit for post procedure observation. FINDINGS: The images document guide needle placement within the right lobe liver lesion. Post biopsy images demonstrate no hemorrhage. IMPRESSION: Successful ultrasound-guided core biopsy of a right lobe liver lesion. Electronically Signed   By: Marybelle Killings M.D.   On: 11/21/2016 15:20    Labs:  CBC:  Recent Labs  11/21/16 1122 12/08/16 1156  WBC 11.2* 11.5*  HGB 13.4 13.7  HCT 38.8 41.0  PLT 331 355    COAGS:  Recent Labs  11/21/16 1122 12/08/16 1156  INR 1.12 1.07  APTT 31 32    BMP: No results for input(s): NA, K, CL, CO2, GLUCOSE, BUN, CALCIUM, CREATININE, GFRNONAA, GFRAA in the last 8760 hours.  Invalid input(s): CMP  LIVER FUNCTION TESTS: No results for input(s): BILITOT, AST, ALT, ALKPHOS, PROT, ALBUMIN in the last 8760 hours.  TUMOR MARKERS: No results for input(s): AFPTM, CEA, CA199, CHROMGRNA in the last 8760 hours.  Assessment and Plan:  Liver lesion bx 1/8: benign steatosis Now for mediastinal subxyphoid mass bx Risks and Benefits discussed with the patient including, but not limited to bleeding, infection, damage to adjacent structures or low yield requiring additional tests. All of the patient's questions were answered, patient is agreeable to proceed. Consent signed and in chart.   Thank you for this interesting consult.  I greatly enjoyed meeting AERIE DONICA and look forward to participating in their care.  A copy of this report was sent to the requesting provider on this date.  Electronically Signed: Lavonia Drafts 12/08/2016, 1:26 PM   I spent a total of  30 Minutes   in face to face in clinical consultation, greater than 50% of which was counseling/coordinating care for subxyphoid mass biopsy

## 2016-12-08 NOTE — Discharge Instructions (Signed)
Needle Biopsy, Care After Introduction Refer to this sheet in the next few weeks. These instructions provide you with information about caring for yourself after your procedure. Your health care provider may also give you more specific instructions. Your treatment has been planned according to current medical practices, but problems sometimes occur. Call your health care provider if you have any problems or questions after your procedure. What can I expect after the procedure? After your procedure, it is common to have soreness, bruising, or mild pain at the biopsy site. This should go away in a few days. Follow these instructions at home:  Rest as directed by your health care provider.  Take medicines only as directed by your health care provider.  There are many different ways to close and cover the biopsy site, including stitches (sutures), skin glue, and adhesive strips. Follow your health care provider's instructions about:  Biopsy site care.  Bandage (dressing) changes and removal.  Biopsy site closure removal.  Check your biopsy site every day for signs of infection. Watch for:  Redness, swelling, or pain.  Fluid, blood, or pus. Contact a health care provider if:  You have a fever.  You have redness, swelling, or pain at the biopsy site that lasts longer than a few days.  You have fluid, blood, or pus coming from the biopsy site.  You feel nauseous.  You vomit. Get help right away if:  You have shortness of breath.  You have trouble breathing.  You have chest pain.  You feel dizzy or you faint.  You have bleeding that does not stop with pressure or a bandage.  You cough up blood.  You have pain in your abdomen. This information is not intended to replace advice given to you by your health care provider. Make sure you discuss any questions you have with your health care provider. Document Released: 03/17/2015 Document Revised: 04/07/2016 Document Reviewed:  10/27/2014  2017 Elsevier

## 2016-12-13 ENCOUNTER — Other Ambulatory Visit: Payer: Self-pay | Admitting: Internal Medicine

## 2016-12-13 ENCOUNTER — Telehealth: Payer: Self-pay | Admitting: *Deleted

## 2016-12-13 ENCOUNTER — Encounter: Payer: Self-pay | Admitting: *Deleted

## 2016-12-13 DIAGNOSIS — R918 Other nonspecific abnormal finding of lung field: Secondary | ICD-10-CM

## 2016-12-13 NOTE — Telephone Encounter (Signed)
Oncology Nurse Navigator Documentation  Oncology Nurse Navigator Flowsheets 12/13/2016  Navigator Location [facility]  Referral date to RadOnc/MedOnc 12/13/2016  Navigator Encounter Type Telephone/I received referral today on Karen Vasquez.  I called her and schedule her to be seen 12/16/16. She verbalized understanding of appt time and place.   Telephone Outgoing Call  Treatment Phase Pre-Tx/Tx Discussion  Barriers/Navigation Needs Coordination of Care  Interventions Coordination of Care  Coordination of Care Appts  Acuity Level 1  Acuity Level 1 Initial guidance, education and coordination as needed  Time Spent with Patient 15

## 2016-12-13 NOTE — Progress Notes (Signed)
Oncology Nurse Navigator Documentation  Oncology Nurse Navigator Flowsheets 12/13/2016  Navigator Location [facility]  Navigator Encounter Type Other/tissue sent to [facility]  Treatment Phase Pre-Tx/Tx Discussion  Barriers/Navigation Needs Coordination of Care  Interventions Coordination of Care  Coordination of Care Other  Acuity Level 1  Time Spent with Patient 15

## 2016-12-14 ENCOUNTER — Telehealth: Payer: Self-pay | Admitting: Internal Medicine

## 2016-12-14 MED ORDER — OXYCODONE HCL 5 MG PO TABS: ORAL_TABLET | ORAL | 0 refills | Status: DC

## 2016-12-14 NOTE — Telephone Encounter (Signed)
Spoke with pt, who states she is having some abdominal pain. Pt states she did have some left over oxycodone, that seemed to help ease the pain. Pt has had to solid bowels in the last 24hr.Pt is requesting an Rx to be sent in to help with the pain. Pt also states that she feels "full all the way up to ger throat" and is unable to eat due to this.  MW please advise. Thanks.   Patient Instructions    Please see patient coordinator before you leave today  to schedule PET - I will call you with results as soon as I get them and we'll go from there   Pantoprazole (protonix) 40 mg   Take 30- 60 min before your first and last meals of the day   GERD (REFLUX)  is an extremely common cause of respiratory symptoms just like yours , many times with no obvious heartburn at all.    It can be treated with medication, but also with lifestyle changes including elevation of the head of your bed (ideally with 6 inch  bed blocks),  Smoking cessation, avoidance of late meals, excessive alcohol, and avoid fatty foods, chocolate, peppermint, colas, red wine, and acidic juices such as orange juice.  NO MINT OR MENTHOL PRODUCTS SO NO COUGH DROPS  USE SUGARLESS CANDY INSTEAD (Jolley ranchers or Stover's or Life Savers) or even ice chips will also do - the key is to swallow to prevent all throat clearing. NO OIL BASED VITAMINS - use powdered substitutes.            After Visit Summary (Printed 11/09/2016)

## 2016-12-14 NOTE — Telephone Encounter (Signed)
Ok to refill the oxycodone - once establishes with oncology they will be in charge of pain meds  If swallowing becomes an issue to point where can't get/keep soup down will need to go to [facility] er as they would likely admit her there to prevent dehydration

## 2016-12-14 NOTE — Telephone Encounter (Signed)
Husband Marcello Moores) is waiting in the lobby to pick up a written prescription. Was told to come to clinic to pick up.Marland KitchenMarland Kitchen

## 2016-12-14 NOTE — Telephone Encounter (Signed)
Tramadol is actually already on her list and prefer she try this first up to 2 q4h prn and give #40  If has already tried I then oxycodone 5 mg 1-2 q 4h prn #40

## 2016-12-14 NOTE — Telephone Encounter (Signed)
Spoke with wife and gave her MW message she stated she has already tried the tramadol and has no relief, rx was printed and MW has signed husband was already up front waiting for rx so given to him. Nothing further is needed at this time.

## 2016-12-14 NOTE — Telephone Encounter (Signed)
Spoke with pt. She is aware of MW's recommendations.  MW - please advise on specifics (mg dosage, frequency, quantity) for the oxycodone prescription. Thanks.

## 2016-12-16 ENCOUNTER — Ambulatory Visit: Payer: BLUE CROSS/BLUE SHIELD

## 2016-12-16 ENCOUNTER — Encounter: Payer: Self-pay | Admitting: Medical Oncology

## 2016-12-16 ENCOUNTER — Ambulatory Visit (HOSPITAL_BASED_OUTPATIENT_CLINIC_OR_DEPARTMENT_OTHER): Payer: BLUE CROSS/BLUE SHIELD | Admitting: Internal Medicine

## 2016-12-16 ENCOUNTER — Encounter: Payer: Self-pay | Admitting: Radiation Oncology

## 2016-12-16 ENCOUNTER — Telehealth: Payer: Self-pay | Admitting: Internal Medicine

## 2016-12-16 ENCOUNTER — Encounter: Payer: Self-pay | Admitting: Internal Medicine

## 2016-12-16 ENCOUNTER — Ambulatory Visit (HOSPITAL_BASED_OUTPATIENT_CLINIC_OR_DEPARTMENT_OTHER): Payer: BLUE CROSS/BLUE SHIELD

## 2016-12-16 ENCOUNTER — Other Ambulatory Visit (HOSPITAL_BASED_OUTPATIENT_CLINIC_OR_DEPARTMENT_OTHER): Payer: BLUE CROSS/BLUE SHIELD

## 2016-12-16 VITALS — BP 115/61 | HR 89 | Temp 98.4°F | Resp 17 | Ht 64.5 in | Wt 148.0 lb

## 2016-12-16 DIAGNOSIS — C772 Secondary and unspecified malignant neoplasm of intra-abdominal lymph nodes: Secondary | ICD-10-CM

## 2016-12-16 DIAGNOSIS — R918 Other nonspecific abnormal finding of lung field: Secondary | ICD-10-CM

## 2016-12-16 DIAGNOSIS — Z7189 Other specified counseling: Secondary | ICD-10-CM

## 2016-12-16 DIAGNOSIS — Z5111 Encounter for antineoplastic chemotherapy: Secondary | ICD-10-CM

## 2016-12-16 DIAGNOSIS — C7951 Secondary malignant neoplasm of bone: Secondary | ICD-10-CM | POA: Diagnosis not present

## 2016-12-16 DIAGNOSIS — C3492 Malignant neoplasm of unspecified part of left bronchus or lung: Secondary | ICD-10-CM | POA: Diagnosis not present

## 2016-12-16 DIAGNOSIS — C787 Secondary malignant neoplasm of liver and intrahepatic bile duct: Secondary | ICD-10-CM | POA: Diagnosis not present

## 2016-12-16 DIAGNOSIS — C7970 Secondary malignant neoplasm of unspecified adrenal gland: Secondary | ICD-10-CM

## 2016-12-16 DIAGNOSIS — C3412 Malignant neoplasm of upper lobe, left bronchus or lung: Secondary | ICD-10-CM

## 2016-12-16 DIAGNOSIS — J3801 Paralysis of vocal cords and larynx, unilateral: Secondary | ICD-10-CM

## 2016-12-16 HISTORY — DX: Malignant neoplasm of unspecified part of left bronchus or lung: C34.92

## 2016-12-16 LAB — CBC WITH DIFFERENTIAL/PLATELET
BASO%: 0.6 % (ref 0.0–2.0)
Basophils Absolute: 0.1 10*3/uL (ref 0.0–0.1)
EOS ABS: 0.6 10*3/uL — AB (ref 0.0–0.5)
EOS%: 3.9 % (ref 0.0–7.0)
HCT: 39.8 % (ref 34.8–46.6)
HEMOGLOBIN: 13.5 g/dL (ref 11.6–15.9)
LYMPH%: 20.8 % (ref 14.0–49.7)
MCH: 29.4 pg (ref 25.1–34.0)
MCHC: 33.9 g/dL (ref 31.5–36.0)
MCV: 86.6 fL (ref 79.5–101.0)
MONO#: 2.1 10*3/uL — ABNORMAL HIGH (ref 0.1–0.9)
MONO%: 15 % — AB (ref 0.0–14.0)
NEUT%: 59.7 % (ref 38.4–76.8)
NEUTROS ABS: 8.6 10*3/uL — AB (ref 1.5–6.5)
Platelets: 375 10*3/uL (ref 145–400)
RBC: 4.59 10*6/uL (ref 3.70–5.45)
RDW: 14 % (ref 11.2–14.5)
WBC: 14.3 10*3/uL — AB (ref 3.9–10.3)
lymph#: 3 10*3/uL (ref 0.9–3.3)

## 2016-12-16 LAB — UA PROTEIN, DIPSTICK - CHCC: PROTEIN: 30 mg/dL

## 2016-12-16 LAB — COMPREHENSIVE METABOLIC PANEL
ALBUMIN: 3.7 g/dL (ref 3.5–5.0)
ALK PHOS: 115 U/L (ref 40–150)
ALT: 23 U/L (ref 0–55)
AST: 31 U/L (ref 5–34)
Anion Gap: 13 mEq/L — ABNORMAL HIGH (ref 3–11)
BILIRUBIN TOTAL: 0.65 mg/dL (ref 0.20–1.20)
BUN: 21.9 mg/dL (ref 7.0–26.0)
CO2: 23 meq/L (ref 22–29)
CREATININE: 1.2 mg/dL — AB (ref 0.6–1.1)
Calcium: 10.6 mg/dL — ABNORMAL HIGH (ref 8.4–10.4)
Chloride: 103 mEq/L (ref 98–109)
EGFR: 47 mL/min/{1.73_m2} — ABNORMAL LOW (ref >90)
GLUCOSE: 98 mg/dL (ref 70–140)
Potassium: 4.3 mEq/L (ref 3.5–5.1)
SODIUM: 138 meq/L (ref 136–145)
TOTAL PROTEIN: 8.3 g/dL (ref 6.4–8.3)

## 2016-12-16 MED ORDER — CYANOCOBALAMIN 1000 MCG/ML IJ SOLN
1000.0000 ug | Freq: Once | INTRAMUSCULAR | Status: AC
  Administered 2016-12-16: 1000 ug via INTRAMUSCULAR

## 2016-12-16 MED ORDER — PROCHLORPERAZINE MALEATE 10 MG PO TABS: 10.0000 mg | ORAL_TABLET | Freq: Four times a day (QID) | ORAL | 0 refills | Status: DC | PRN

## 2016-12-16 MED ORDER — FOLIC ACID 1 MG PO TABS: 1.0000 mg | ORAL_TABLET | Freq: Every day | ORAL | 4 refills | Status: DC

## 2016-12-16 NOTE — Progress Notes (Signed)
[place] [phone]   [phone]  CONSULT NOTE  REFERRING PHYSICIAN: Dr. Christinia Gully  REASON FOR CONSULTATION:  66 years old white female recently diagnosed with lung cancer.  HPI Karen Vasquez is a 66 y.o. female a never smoker with past medical history significant for osteoarthritis and dyslipidemia. The patient mentions that she has been complaining of allergy, sinusitis, hoarseness of her voice and mild cough since September 2017. She also started having some choking when drinking liquid. She also complained of tightness of her voice and head congestion as well as hiccups. She was referred to ENT and she was seen by Dr. Janace Hoard. 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 The patient underwent CT scan of the neck and chest on 11/01/2016. The scan showed upper mediastinal mass within the prevascular space and in the expected location of the left recurrent laryngeal nerve consistent with reported focal cord paralysis. This mass was incompletely visualized on the CT scan of the neck. CT scan of the chest showed 1.8 cm spiculated lesion in the anterior medial left upper lobe and poorly defined 3.7 cm lesion in the mediastinal prevascular space. This is consistent with primary lung neoplasm with mediastinal and hilar lymphadenopathy. The patient was referred to Dr. Melvyn Novas and a PET scan was performed on 11/17/2016 and it showed widespread FDG avid malignancy. The spiculated nodule in the anterior left upper lobe measured 1.8 x 1.7 x 1.4 cm. With maximum SUV of 4.0. There was metastatic disease identified within the mediastinum, posterior to the right and left of the midline. The AP window metastatic lesion measured 2.3 x 3.7 x 3.6 cm with SUV max of 11.1. There was also FDG avid metastatic disease in the prevascular space within the right peritracheal node and in the subcarinal. There was low level uptake in the bilateral hila right greater than left but was  nonspecific. The PET scan also showed evidence for metastatic disease to the liver, bilateral adrenal glands, abdominal lymph nodes as well as multiple metastatic bone disease. There was also mildly avid left thyroid nodule that was nonspecific. On 11/21/2016, the patient underwent ultrasound-guided core biopsy of the right lobe liver lesion. The final pathology was not conclusive for malignancy. On 12/08/2016 the patient underwent ultrasound-guided core biopsy of abdominal wall soft tissue mass by interventional radiology. 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 The tumor was positively stains for cytokeratin 7, Napsin-A and TTF-1, weakly positive for P63 and negative for CK5/6 and cytokeratin 20. The tissue block was sent to [facility] one for molecular studies. Dr. Melvyn Novas kindly referred the patient to me today for evaluation and recommendation regarding treatment of her condition. When seen today the patient continues to complain of pain on the right side of the chest and back as well as sacral area. She lost around 15 pounds in the last 2 months. She continues to have cough productive of whitish sputum with no hemoptysis. She denied having any shortness of breath except with exertion. She denied having any nausea, vomiting, diarrhea or constipation. She has no headache or visual changes. Family history significant for mother who is 75 and has diabetes mellitus and history of breast cancer. Father is 19 and has history of prostate cancer. She is a caregiver of her son who has muscular dystrophy. The patient is married and has 3 children. She was accompanied today by her husband Karen Vasquez. 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 She has no history of smoking, alcohol or drug abuse.   HPI  Past Medical History:  Diagnosis Date  . Adenocarcinoma of left lung, stage  4 ([place]) 12/16/2016  . Arthritis   . Asthma due to seasonal allergies   . GERD (gastroesophageal reflux disease)   . Hernia of abdominal wall    umbilical  . No pertinent past medical history    COLD UPPER RESP STARTED [facility]PAK 6/14  . Recurrent upper respiratory infection (URI)    recent sinus inf  . Thyroid nodule   . Urine blood    hematuria    Past Surgical History:  Procedure Laterality Date  . ABDOMINAL HYSTERECTOMY  08   vag, rectocele repair , bladder sling  . ANTERIOR CERVICAL DECOMP/DISCECTOMY FUSION  05/09/2012   Procedure: ANTERIOR CERVICAL DECOMPRESSION/DISCECTOMY FUSION 1 LEVEL;  Surgeon: Eustace Moore, MD;  Location: [facility];  Service: Neurosurgery;  Laterality: N/A;  anterior cervical decompression fusion cervical five-six  . BREAST CYST EXCISION  96   fibroadenoma lft  . BREAST SURGERY     03  . CARPAL TUNNEL RELEASE  05   bil  . DILATION AND CURETTAGE OF UTERUS  03    polyps  . TUBAL LIGATION  88    History reviewed. No pertinent family history.  Social History Social History  Substance Use Topics  . Smoking status: Never Smoker  . Smokeless tobacco: Never Used  . Alcohol use No    Allergies  Allergen Reactions  . Prednisone Other (See Comments)    Steroids give me chest pain"  . Fenofibrate Other (See Comments)    Muscle aches  . Quinolones Other (See Comments)    Causes pt to limp after taking medication for a few days.   . Statins Other (See Comments)    Muscle pain   . Septra [Sulfamethoxazole-Trimethoprim] Rash    Current Outpatient Prescriptions  Medication Sig Dispense Refill  . acetaminophen (TYLENOL) 500 MG tablet Take 500 mg by mouth every 6 (six) hours as needed. occ 650 2 twice a day    . acetaminophen (TYLENOL) 650 MG CR tablet Take 1,300 mg by mouth at bedtime as needed for pain.    . Ascorbic Acid (VITAMIN C) 1000 MG tablet Take 1,000 mg by mouth daily.     Marland Kitchen azelastine (ASTELIN) 137 MCG/SPRAY nasal spray Place 1 spray into the  nose as needed. Use in each nostril as directed    . calcipotriene-betamethasone (TACLONEX) ointment Apply 1 application topically daily. Given samples from Dr office    . Calcium Citrate-Vitamin D (CALCIUM CITRATE + D3 PO) Take 2 tablets by mouth 2 (two) times daily. With magnesium    . Cholecalciferol (VITAMIN D) 2000 units tablet Take 2,000 Units by mouth daily.    Marland Kitchen conjugated estrogens (PREMARIN) vaginal cream Place 1 Applicatorful vaginally daily.    . Cranberry 500 MG TABS Take 1 tablet by mouth 2 (two) times daily.    Marland Kitchen ezetimibe (ZETIA) 10 MG tablet Take 10 mg by mouth daily.    Marland Kitchen ketotifen (ZADITOR) 0.025 % ophthalmic solution Place 1 drop into both eyes 2 (two) times daily.    Marland Kitchen loratadine (CLARITIN) 10 MG tablet Take 10 mg by mouth daily.    . Multiple Vitamin (MULTIVITAMIN) tablet Take 1 tablet by mouth daily.    Marland Kitchen oxyCODONE (OXY IR/ROXICODONE) 5 MG immediate release tablet Take 1-2 tablets every  4 hours as needed 40 tablet 0  . pantoprazole (PROTONIX) 40 MG tablet Take 40 mg by mouth 2 (two) times daily.     . raloxifene (EVISTA) 60 MG tablet Take 60 mg by mouth daily.    . sodium chloride (OCEAN) 0.65 % nasal spray Place 1 spray into the nose daily as needed for congestion.     . traMADol (ULTRAM) 50 MG tablet Take 50 mg by mouth every 12 (twelve) hours as needed.     . trimethoprim (TRIMPEX) 100 MG tablet Take 100 mg by mouth daily.      Current Facility-Administered Medications  Medication Dose Route Frequency Provider Last Rate Last Dose  . cyanocobalamin ((VITAMIN B-12)) injection 1,000 mcg  1,000 mcg Intramuscular Once Curt Bears, MD        Review of Systems  Constitutional: positive for anorexia, fatigue and weight loss Eyes: negative Ears, nose, mouth, throat, and face: positive for hoarseness and voice change Respiratory: positive for cough, pleurisy/chest pain and sputum Cardiovascular: negative Gastrointestinal:  negative Genitourinary:negative Integument/breast: negative Hematologic/lymphatic: negative Musculoskeletal:positive for back pain and bone pain Neurological: negative Behavioral/Psych: negative Endocrine: negative Allergic/Immunologic: negative  Physical Exam  YNW:GNFAO, healthy, no distress, well nourished, well developed and anxious SKIN: skin color, texture, turgor are normal, no rashes or significant lesions HEAD: Normocephalic, No masses, lesions, tenderness or abnormalities EYES: normal, PERRLA, Conjunctiva are pink and non-injected EARS: External ears normal, Canals clear OROPHARYNX:no exudate, no erythema and lips, buccal mucosa, and tongue normal  NECK: supple, no adenopathy, no JVD LYMPH:  no palpable lymphadenopathy, no hepatosplenomegaly BREAST: Not performed. LUNGS: clear to auscultation , and palpation HEART: regular rate & rhythm, no murmurs and no gallops ABDOMEN:abdomen soft, non-tender, normal bowel sounds and no masses or organomegaly BACK: Back symmetric, no curvature., No CVA tenderness EXTREMITIES:no joint deformities, effusion, or inflammation, no edema, no skin discoloration  NEURO: alert & oriented x 3 with fluent speech, no focal motor/sensory deficits  PERFORMANCE STATUS: ECOG 1  LABORATORY DATA: Lab Results  Component Value Date   WBC 14.3 (H) 12/16/2016   HGB 13.5 12/16/2016   HCT 39.8 12/16/2016   MCV 86.6 12/16/2016   PLT 375 12/16/2016      Chemistry      Component Value Date/Time   NA 138 12/16/2016 0852   K 4.3 12/16/2016 0852   CL 101 05/01/2012 0927   CO2 23 12/16/2016 0852   BUN 21.9 12/16/2016 0852   CREATININE 1.2 (H) 12/16/2016 0852      Component Value Date/Time   CALCIUM 10.6 (H) 12/16/2016 0852   ALKPHOS 115 12/16/2016 0852   AST 31 12/16/2016 0852   ALT 23 12/16/2016 0852   BILITOT 0.65 12/16/2016 0852       RADIOGRAPHIC STUDIES: Nm Pet Image Initial (pi) Skull Base To Thigh  Result Date: 11/17/2016 CLINICAL  DATA:  Lung mass.  Staging. EXAM: NUCLEAR MEDICINE PET SKULL BASE TO THIGH TECHNIQUE: 12.27 mCi F-18 FDG was injected intravenously. Full-ring PET imaging was performed from the skull base to thigh after the radiotracer. CT data was obtained and used for attenuation correction and anatomic localization. FASTING BLOOD GLUCOSE:  Value: 103 mg/dl COMPARISON:  November 01, 2016 chest CT FINDINGS: NECK Increased uptake in the right vocal cord is consistent with left vocal cord paralysis. There is a nodule in the inferior left thyroid lobe with gross pleural calcification. The nodule measures approximately 6.6 mm on series 4, image 61 with a maximum SUV of 3.2. No other abnormal  uptake in the neck. CHEST The spiculated nodule in the anterior left upper lobe measures 1.8 x 1.7 x 1.4 cm on the CT of the chest November 01, 2016 without significant interval change given difference in slice selection. This nodule demonstrates a maximum SUV of 4. Metastatic disease is identified within the mediastinum, both to the right and left of midline. The AP window metastatic lesion better identified on the recent CT scan measured 3.3 x 3.7 x 3.6 cm at that time without significant change given difference in technique. The maximum SUV within this metastasis is 11.1. FDG avid metastatic disease is also seen in the prevascular space, within a right paratracheal node, and in the subcarinal region. Low level uptake in the bilateral hila, right greater than left is nonspecific. Recommend attention on follow-up. The maximum SUV in the right hilum is 2.7. ABDOMEN/PELVIS The mass in the hepatic dome on the recent CT scan is consistent with a metastatic lesion. Again, this mass was better seen on the recent contrast-enhanced CT scan measuring up to 3.3 cm at that time. The maximum associated SUV is 7.5. There is mild increased uptake in the liver surrounding the mass, particularly medially and inferiorly consistent with extension. No other  discrete FDG avid disease seen within the liver. There is a metastatic node in the anterior abdomen just deep to the chest wall on series 4, image 131 with abnormal FDG uptake. This node measures 13 x 10 mm with a maximum SUV of 8.1. A portacaval node demonstrates mild increased FDG uptake, also suspicious. There are at least 2 adjacent mesenteric nodes anterior to the pancreatic head which are FDG avid and suspicious. There is a small avid noted just posterior to the right renal vessels on series 4, image 143. An FDG avid node is seen to the right of midline, just inferior to the duodenum on series 4, image 172. FDG avid adrenal metastases are also seen, left larger than right. The left adrenal metastasis measures 3.2 by 3.1 cm on series 4, image 134 with a maximum SUV of 12.8. SKELETON Bony metastatic disease involves the T6 vertebral body, the left side of the sacrum, the right sacrum, the right femur, and at least 2 ribs. IMPRESSION: 1. Widespread FDG avid malignancy. 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 2. Left vocal cord paralysis. 3. The mildly avid left thyroid nodule is nonspecific. Ultrasound could better evaluate. Electronically Signed   By: Dorise Bullion III M.D   On: 11/17/2016 13:44   US Biopsy  Result Date: 12/08/2016 CLINICAL DATA:  Left upper lobe lung mass with evidence by PET scan of mediastinal lymphadenopathy, hypermetabolic liver lesion, bilateral adrenal masses and subxiphoid abdominal wall metastatic soft tissue nodule/lymph node. Previous ultrasound guided liver biopsy on 11/21/2014 did not reveal evidence of malignancy. She now presents for biopsy of the abdominal wall soft tissue mass. EXAM: ULTRASOUND GUIDED CORE BIOPSY OF ABDOMINAL WALL SOFT TISSUE MASS COMPARISON:  PET scan on 11/17/2016 and CT of  the chest on 11/01/2016. MEDICATIONS: 0.5 mg IV Versed; 25 mcg IV Fentanyl Total Moderate Sedation Time: 10 minutes. The patient's level of consciousness and physiologic status were continuously monitored during the procedure by Radiology nursing. PROCEDURE: The procedure, risks, benefits, and alternatives were explained to the patient. Questions regarding the procedure were encouraged and answered. The patient understands and consents to  the procedure. A time out was performed prior to initiating the procedure. The anterior epigastric region was prepped with chlorhexidine in a sterile fashion, and a sterile drape was applied covering the operative field. A sterile gown and sterile gloves were used for the procedure. Local anesthesia was provided with 1% Lidocaine. Ultrasound was used to localize an abnormal superficial abdominal mass in the left subxiphoid region superficial to the left lobe of the liver. 18 gauge core biopsy samples were obtained. A total of 4 core biopsy samples were obtained and submitted in formalin. Additional ultrasound was performed. COMPLICATIONS: None. FINDINGS: Ultrasound localize is a 1.0 x 1.4 cm soft tissue nodule in the subxiphoid region to the left of midline. This corresponds to the abnormal hypermetabolic soft tissue nodule seen by PET scan. Based on sonographic appearance, this is most likely felt to represent an abnormal metastatic lymph node. Solid tissue was obtained. IMPRESSION: Ultrasound-guided core biopsy performed of a knee 1.0 x 1.4 cm subxiphoid abdominal wall soft tissue nodule superficial to the liver. Based on ultrasound appearance, this is felt to most likely represent an abnormal lymph node. Electronically Signed   By: Aletta Edouard M.D.   On: 12/08/2016 15:28   US Biopsy  Result Date: 11/21/2016 INDICATION: Lung mass.  Liver mass. EXAM: ULTRASOUND-GUIDED BIOPSY OF A RIGHT LOBE LIVER MASS.  CORE. MEDICATIONS: None. ANESTHESIA/SEDATION: Fentanyl 100 mcg IV;  Versed 2 mg IV Moderate Sedation Time:  10 The patient was continuously monitored during the procedure by the interventional radiology nurse under my direct supervision. FLUOROSCOPY TIME:  NONE. COMPLICATIONS: None immediate. PROCEDURE: Informed written consent was obtained from the patient after a thorough discussion of the procedural risks, benefits and alternatives. All questions were addressed. Maximal Sterile Barrier Technique was utilized including caps, mask, sterile gowns, sterile gloves, sterile drape, hand hygiene and skin antiseptic. A timeout was performed prior to the initiation of the procedure. The right flank was prepped with ChloraPrep in a sterile fashion, and a sterile drape was applied covering the operative field. A sterile gown and sterile gloves were used for the procedure. Under sonographic guidance, an 17 gauge guide needle was advanced into the right lobe liver lesion. Subsequently 3 18 gauge core biopsies were obtained. Gel-Foam slurry was injected into the needle tract. The guide needle was removed. Final imaging was performed. Patient tolerated the procedure well without complication. Vital sign monitoring by nursing staff during the procedure will continue as patient is in the special procedures unit for post procedure observation. FINDINGS: The images document guide needle placement within the right lobe liver lesion. Post biopsy images demonstrate no hemorrhage. IMPRESSION: Successful ultrasound-guided core biopsy of a right lobe liver lesion. Electronically Signed   By: Marybelle Killings M.D.   On: 11/21/2016 15:20    ASSESSMENT: This is a very pleasant 66 years old white female recently diagnosed with a stage IVB (T1b, N2, M1c) non-small cell lung cancer, adenocarcinoma in a never smoker patient diagnosed in January 2018 and presented with left upper lobe lung mass, mediastinal lymphadenopathy as well as metastatic disease to the bone, liver, and adrenal as well as abdominal wall  nodules.   PLAN: I had a lengthy discussion with the patient and her husband today about her current disease stage, prognosis and treatment options. Her husband was in the room and her son and daughter joined the discussion by telephone. I recommended for the patient to complete the staging workup by ordering a MRI of the brain to rule out brain  metastasis. I had a lengthy discussion with the patient about her current prognosis with and without treatment and the goals of care. I explained to the patient and her husband that she has incurable condition and also treatment will be off palliative nature. I gave her the option of palliative care and hospice referral versus consideration of treatment with systemic therapy either with target therapy if she has an actionable mutation versus palliative systemic chemotherapy or immunotherapy. The patient is interested in consideration of treatment. I will ask the pathology department to send her tissue for molecular studies by [facility] one as well as PDL 1 expression. I discussed with the patient with her to wait until the molecular studies are available or to proceed with systemic chemotherapy for now since she is symptomatic and continues to have significant hoarseness of voice as well as bony pain. She would like to start treatment as soon as possible. I recommended for her treatment with carboplatin for AUC of 5, Alimta 500 MG/M2 and Avastin 15 MG/KG every 3 weeks. I discussed with the patient adverse effects of this treatment including but not limited to alopecia, myelosuppression, nausea and vomiting, peripheral neuropathy, liver or renal dysfunction as well as the adverse effect of Avastin including risk for pulmonary hemorrhage, GI perforation, hypertension and proteinuria.. We will arrange for the patient to receive vitamin B 12 injection today. The patient would also receive prescription for Compazine 10 mg by mouth every 6 hours as needed for nausea,  in addition to folic acid 1 mg by mouth daily. She has allergy to steroids and I would hold her Decadron premedication for now. I will arrange for the patient to have a chemotherapy education class before starting the first dose of the chemotherapy. She is expected to start the first dose of her treatment next week. I would also referred the patient to radiation oncology for consideration of palliative radiotherapy to the painful bony lesions as well as the large mediastinal lymph nodes causing her hoarseness. I will see her back for follow-up visit in 2 weeks for evaluation and management of any adverse effect of her treatment. For pain management, she will continue her current treatment with oxycodone and tramadol as needed. I'll be happy to give the patient referral as needed. She was advised to call immediately if she has any concerning symptoms in the interval. The patient voices understanding of current disease status and treatment options and is in agreement with the current care plan.  All questions were answered. The patient knows to call the clinic with any problems, questions or concerns. We can certainly see the patient much sooner if necessary.  Thank you so much for allowing me to participate in the care of Karen Vasquez. I will continue to follow up the patient with you and assist in her care.  I spent 55 minutes counseling the patient face to face. The total time spent in the appointment was 80 minutes.  Disclaimer: This note was dictated with voice recognition software. Similar sounding words can inadvertently be transcribed and may not be corrected upon review.   Lalana Wachter K. December 16, 2016, 11:36 AM

## 2016-12-16 NOTE — Progress Notes (Signed)
START ON PATHWAY REGIMEN - Non-Small Cell Lung  [id number]: Carboplatin AUC=5 + Pemetrexed 500 mg/m2 + Bevacizumab 15 mg/kg q21 Days x 4 Cycles   A cycle is every 21 days:     Carboplatin (Paraplatin(R)) AUC=5 in 250 mL NS IV over 1 hour Dose Mod: None     Pemetrexed (Alimta(R)) 500 mg/m2 in 100 mL NS IV over 10 minutes, manufacturer recommends not administering to patients with CrCl < 45 mL/min Dose Mod: None     Bevacizumab (Avastin(R)) 15 mg/kg in 100 mL NS IV over 90 minutes first infusion, 60 minutes second infusion and 30 minutes all subsequent infusions if tolerated Dose Mod: None Additional Orders: * All AUC calculations intended to be used in [employer] formula Note: Patient to receive the following prior to the initiation of therapy: 1) Dexamethasone 4 mg orally twice daily x 6 doses.  First dose 24 hours before chemotherapy. 2) Folic acid >= 011 mcg orally daily.  First dose at least 5 days prior to the first dose of pemetrexed. 3) Vitamin B12 1,000 mcg intramuscularly every 9 weeks.  First dose at least 5 days prior to the first dose of pemetrexed.  **Always confirm dose/schedule in your pharmacy ordering system**    Patient Characteristics: Stage IV Metastatic, Non Squamous, Initial Chemotherapy/Immunotherapy, PS = 0, 1, PD-L1 Expression Positive 1-49% (TPS) / Negative / Not Tested / Not a Candidate for Immunotherapy AJCC T Category: T1b Current Disease Status: Distant Metastases AJCC N Category: N2 AJCC M Category: M1c AJCC 8 Stage Grouping: IVB Histology: Non Squamous Cell ROS1 Rearrangement Status: Awaiting Test Results [id number] Mutation Status: Not Applicable - EGFR Mutation Negative/Unknown Other Mutations/Biomarkers: No Other Actionable Mutations PD-L1 Expression Status: Test Not Ordered Chemotherapy/Immunotherapy LOT: Initial Chemotherapy/Immunotherapy Molecular Targeted Therapy: Not Appropriate ALK Translocation Status: Awaiting Test Results Would you be surprised if  this patient died  in the next year? I would NOT be surprised if this patient died in the next year EGFR Mutation Status: Awaiting Test Results BRAF [id number] Mutation Status: Awaiting Test Results Performance Status: PS = 0, 1  Intent of Therapy: Non-Curative / Palliative Intent, Discussed with Patient

## 2016-12-16 NOTE — Patient Instructions (Signed)
Cyanocobalamin, Vitamin B12 injection What is this medicine? CYANOCOBALAMIN (sye an oh koe BAL a min) is a man made form of vitamin B12. Vitamin B12 is used in the growth of healthy blood cells, nerve cells, and proteins in the body. It also helps with the metabolism of fats and carbohydrates. This medicine is used to treat people who can not absorb vitamin B12. This medicine may be used for other purposes; ask your health care provider or pharmacist if you have questions. COMMON BRAND NAME(S): B-12 Compliance Kit, B-12 Injection Kit, Cyomin, LA-12, Nutri-Twelve, Physicians EZ Use B-12, Primabalt What should I tell my health care provider before I take this medicine? They need to know if you have any of these conditions: -kidney disease -Leber's disease -megaloblastic anemia -an unusual or allergic reaction to cyanocobalamin, cobalt, other medicines, foods, dyes, or preservatives -pregnant or trying to get pregnant -breast-feeding How should I use this medicine? This medicine is injected into a muscle or deeply under the skin. It is usually given by a health care professional in a clinic or doctor's office. However, your doctor may teach you how to inject yourself. Follow all instructions. Talk to your pediatrician regarding the use of this medicine in children. Special care may be needed. Overdosage: If you think you have taken too much of this medicine contact a poison control center or emergency room at once. NOTE: This medicine is only for you. Do not share this medicine with others. What if I miss a dose? If you are given your dose at a clinic or doctor's office, call to reschedule your appointment. If you give your own injections and you miss a dose, take it as soon as you can. If it is almost time for your next dose, take only that dose. Do not take double or extra doses. What may interact with this medicine? -colchicine -heavy alcohol intake This list may not describe all possible  interactions. Give your health care provider a list of all the medicines, herbs, non-prescription drugs, or dietary supplements you use. Also tell them if you smoke, drink alcohol, or use illegal drugs. Some items may interact with your medicine. What should I watch for while using this medicine? Visit your doctor or health care professional regularly. You may need blood work done while you are taking this medicine. You may need to follow a special diet. Talk to your doctor. Limit your alcohol intake and avoid smoking to get the best benefit. What side effects may I notice from receiving this medicine? Side effects that you should report to your doctor or health care professional as soon as possible: -allergic reactions like skin rash, itching or hives, swelling of the face, lips, or tongue -blue tint to skin -chest tightness, pain -difficulty breathing, wheezing -dizziness -red, swollen painful area on the leg Side effects that usually do not require medical attention (report to your doctor or health care professional if they continue or are bothersome): -diarrhea -headache This list may not describe all possible side effects. Call your doctor for medical advice about side effects. You may report side effects to FDA at 1-800-FDA-1088. Where should I keep my medicine? Keep out of the reach of children. Store at room temperature between 15 and 30 degrees C (59 and 85 degrees F). Protect from light. Throw away any unused medicine after the expiration date. NOTE: This sheet is a summary. It may not cover all possible information. If you have questions about this medicine, talk to your doctor, pharmacist, or   health care provider.  2017 Elsevier/Gold Standard (2008-02-11 22:10:20)  

## 2016-12-16 NOTE — Telephone Encounter (Signed)
Appointments scheduled per 2/2 LOS. Patient given AVS report and calendars with future scheduled appointments.

## 2016-12-17 ENCOUNTER — Encounter: Payer: Self-pay | Admitting: Internal Medicine

## 2016-12-17 ENCOUNTER — Other Ambulatory Visit: Payer: Self-pay | Admitting: Internal Medicine

## 2016-12-17 DIAGNOSIS — Z5112 Encounter for antineoplastic immunotherapy: Secondary | ICD-10-CM | POA: Insufficient documentation

## 2016-12-17 DIAGNOSIS — Z5111 Encounter for antineoplastic chemotherapy: Secondary | ICD-10-CM

## 2016-12-17 DIAGNOSIS — Z7189 Other specified counseling: Secondary | ICD-10-CM

## 2016-12-17 HISTORY — DX: Other specified counseling: Z71.89

## 2016-12-17 HISTORY — DX: Encounter for antineoplastic chemotherapy: Z51.11

## 2016-12-19 ENCOUNTER — Other Ambulatory Visit: Payer: BLUE CROSS/BLUE SHIELD

## 2016-12-20 ENCOUNTER — Ambulatory Visit (HOSPITAL_COMMUNITY)
Admission: RE | Admit: 2016-12-20 | Discharge: 2016-12-20 | Disposition: A | Discharge status: Home / Self Care | Payer: BLUE CROSS/BLUE SHIELD | Source: Ambulatory Visit | Attending: Internal Medicine | Admitting: Internal Medicine

## 2016-12-20 DIAGNOSIS — C3490 Malignant neoplasm of unspecified part of unspecified bronchus or lung: Secondary | ICD-10-CM | POA: Diagnosis not present

## 2016-12-20 DIAGNOSIS — C3492 Malignant neoplasm of unspecified part of left bronchus or lung: Secondary | ICD-10-CM | POA: Insufficient documentation

## 2016-12-20 MED ORDER — GADOBENATE DIMEGLUMINE 529 MG/ML IV SOLN
15.0000 mL | Freq: Once | INTRAVENOUS | Status: AC | PRN
  Administered 2016-12-20: 13 mL via INTRAVENOUS

## 2016-12-21 ENCOUNTER — Telehealth: Payer: Self-pay

## 2016-12-21 LAB — EPIDERMAL GROWTH FACTOR RECEPTOR (EGFR) MUTATION ANALYSIS

## 2016-12-21 NOTE — Telephone Encounter (Signed)
Pt called with 3 questions 1) she has a pessary for a rectocele. She has had some bleeding. She uses premarin lubricant to place pessary. Is it any problem b/c of the bleeding. Answer: bleeding is OK at this moment b/c it is not much and known cause. We will have to keep an eye on it and make sure it does not worsen, especially in the nadir period (explained that) 2) woke with a scratchy throat this AM. Is it OK to take zinc?  Answer : yes or salt water gargles 3) if she gets constipation, can she take miralax. Answer: yes, it will be trial and error on how much may be needed.  She appreciated the call back

## 2016-12-21 NOTE — Progress Notes (Signed)
Thoracic Location of Tumor / Histology: Lung  Patient presentedmonths ago with symptoms of: continues to complain of pain on the right side of the chest and back as well as sacral area. She lost around 15 pounds in the last 2 months. She continues to have cough productive of whitish sputum with no hemoptysis. Tightness of her voice and head congestion as well as hiccups  Biopsies (if applicable) revealed: Metastatic disease- PET revealed, evidence for metastatic disease to liver, bilateral adrenal glands, abdominal lymph nodes and bone.       Tobacco/Marijuana/Snuff/ETOH use: never  Past/Anticipated interventions by cardiothoracic surgery, if any:  Past/Anticipated interventions by medical oncology, if any: Mohamed: pathology department to send her tissue for molecular studies by [facility] one as well as PDL 1 expression. Discussed with the patient with her to wait until the molecular studies are available or to proceed with systemic chemotherapy for now since she is symptomatic and continues to have significant hoarseness of voice as well as bony pain. She would like to start treatment as soon as possible. I recommended for her treatment with carboplatin for AUC of 5, Alimta 500 MG/M2 and Avastin 15 MG/KG every 3 weeks.  Signs/Symptoms  Weight changes, if any: 15-20 lbs weight loss over 2 months. Reports prior to dx she was having GERD thus she decreased her carbo intake and lost some weight.   Respiratory complaints, if any: Reports a  dry cough. Denies SOB. Hoarseness. Difficulty to drink because she strangles so easily thus, reports poor po intake.  Hemoptysis, if any: no  Pain issues, if any:  Yes, mediastinum, mid to low back, and lower portion of posterior ribs.  Denies any bowel complaints. Taking Miralax regularly.   SAFETY ISSUES:  Prior radiation? no  Pacemaker/ICD? no   Possible current pregnancy?no  Is the patient on methotrexate? no  Current Complaints / other  details: 66 year female. Married. 3 children, one son has Muscular Dystrophy.  Steroid allergy.  PCP referred patient to Dr. Janace Hoard for evaluation after she presented complaining "there is something in my throat." Dr. 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 Received first cycle of chemotherapy on 12/22/16. Chemotherapy ordered for every three weeks.

## 2016-12-22 ENCOUNTER — Encounter: Payer: Self-pay | Admitting: Nurse Practitioner

## 2016-12-22 ENCOUNTER — Other Ambulatory Visit (HOSPITAL_BASED_OUTPATIENT_CLINIC_OR_DEPARTMENT_OTHER): Payer: BLUE CROSS/BLUE SHIELD

## 2016-12-22 ENCOUNTER — Ambulatory Visit (HOSPITAL_BASED_OUTPATIENT_CLINIC_OR_DEPARTMENT_OTHER): Payer: BLUE CROSS/BLUE SHIELD | Admitting: Nurse Practitioner

## 2016-12-22 ENCOUNTER — Ambulatory Visit (HOSPITAL_BASED_OUTPATIENT_CLINIC_OR_DEPARTMENT_OTHER): Payer: BLUE CROSS/BLUE SHIELD

## 2016-12-22 ENCOUNTER — Encounter: Payer: Self-pay | Admitting: Internal Medicine

## 2016-12-22 VITALS — BP 153/78 | HR 94 | Temp 98.4°F | Resp 17

## 2016-12-22 DIAGNOSIS — C3492 Malignant neoplasm of unspecified part of left bronchus or lung: Secondary | ICD-10-CM

## 2016-12-22 DIAGNOSIS — T7840XA Allergy, unspecified, initial encounter: Secondary | ICD-10-CM | POA: Insufficient documentation

## 2016-12-22 DIAGNOSIS — Z5112 Encounter for antineoplastic immunotherapy: Secondary | ICD-10-CM

## 2016-12-22 DIAGNOSIS — Z5111 Encounter for antineoplastic chemotherapy: Secondary | ICD-10-CM

## 2016-12-22 LAB — CBC WITH DIFFERENTIAL/PLATELET
BASO%: 0.2 % (ref 0.0–2.0)
BASOS ABS: 0 10*3/uL (ref 0.0–0.1)
EOS ABS: 0.4 10*3/uL (ref 0.0–0.5)
EOS%: 3.6 % (ref 0.0–7.0)
HEMATOCRIT: 39.3 % (ref 34.8–46.6)
HGB: 12.9 g/dL (ref 11.6–15.9)
LYMPH%: 16.5 % (ref 14.0–49.7)
MCH: 28.7 pg (ref 25.1–34.0)
MCHC: 32.8 g/dL (ref 31.5–36.0)
MCV: 87.3 fL (ref 79.5–101.0)
MONO#: 1.4 10*3/uL — AB (ref 0.1–0.9)
MONO%: 11.5 % (ref 0.0–14.0)
NEUT%: 68.2 % (ref 38.4–76.8)
NEUTROS ABS: 8.4 10*3/uL — AB (ref 1.5–6.5)
NRBC: 0 % (ref 0–0)
PLATELETS: 351 10*3/uL (ref 145–400)
RBC: 4.5 10*6/uL (ref 3.70–5.45)
RDW: 13.9 % (ref 11.2–14.5)
WBC: 12.3 10*3/uL — AB (ref 3.9–10.3)
lymph#: 2 10*3/uL (ref 0.9–3.3)

## 2016-12-22 LAB — COMPREHENSIVE METABOLIC PANEL
ALK PHOS: 154 U/L — AB (ref 40–150)
ALT: 26 U/L (ref 0–55)
ANION GAP: 11 meq/L (ref 3–11)
AST: 29 U/L (ref 5–34)
Albumin: 3.4 g/dL — ABNORMAL LOW (ref 3.5–5.0)
BILIRUBIN TOTAL: 0.39 mg/dL (ref 0.20–1.20)
BUN: 13.2 mg/dL (ref 7.0–26.0)
CALCIUM: 10 mg/dL (ref 8.4–10.4)
CO2: 26 mEq/L (ref 22–29)
CREATININE: 0.8 mg/dL (ref 0.6–1.1)
Chloride: 103 mEq/L (ref 98–109)
EGFR: 78 mL/min/{1.73_m2} — ABNORMAL LOW (ref >90)
Glucose: 114 mg/dl (ref 70–140)
Potassium: 3.9 mEq/L (ref 3.5–5.1)
Sodium: 140 mEq/L (ref 136–145)
TOTAL PROTEIN: 7.6 g/dL (ref 6.4–8.3)

## 2016-12-22 MED ORDER — PALONOSETRON HCL INJECTION 0.25 MG/5ML
0.2500 mg | Freq: Once | INTRAVENOUS | Status: AC
  Administered 2016-12-22: 0.25 mg via INTRAVENOUS

## 2016-12-22 MED ORDER — PALONOSETRON HCL INJECTION 0.25 MG/5ML
INTRAVENOUS | Status: AC
  Filled 2016-12-22: qty 5

## 2016-12-22 MED ORDER — SODIUM CHLORIDE 0.9 % IV SOLN
15.0000 mg/kg | Freq: Once | INTRAVENOUS | Status: AC
  Administered 2016-12-22: 1000 mg via INTRAVENOUS
  Filled 2016-12-22: qty 32

## 2016-12-22 MED ORDER — DIPHENHYDRAMINE HCL 50 MG/ML IJ SOLN
25.0000 mg | Freq: Once | INTRAMUSCULAR | Status: AC | PRN
  Administered 2016-12-22: 25 mg via INTRAVENOUS

## 2016-12-22 MED ORDER — SODIUM CHLORIDE 0.9 % IV SOLN
Freq: Once | INTRAVENOUS | Status: AC
  Administered 2016-12-22: 12:00:00 via INTRAVENOUS

## 2016-12-22 MED ORDER — FAMOTIDINE IN NACL 20-0.9 MG/50ML-% IV SOLN
20.0000 mg | Freq: Once | INTRAVENOUS | Status: AC | PRN
  Administered 2016-12-22: 20 mg via INTRAVENOUS

## 2016-12-22 MED ORDER — SODIUM CHLORIDE 0.9 % IV SOLN
420.0000 mg | Freq: Once | INTRAVENOUS | Status: AC
  Administered 2016-12-22: 420 mg via INTRAVENOUS
  Filled 2016-12-22: qty 42

## 2016-12-22 MED ORDER — SODIUM CHLORIDE 0.9 % IV SOLN
510.0000 mg/m2 | Freq: Once | INTRAVENOUS | Status: AC
  Administered 2016-12-22: 900 mg via INTRAVENOUS
  Filled 2016-12-22: qty 16

## 2016-12-22 MED ORDER — SODIUM CHLORIDE 0.9 % IV SOLN
Freq: Once | INTRAVENOUS | Status: AC
  Administered 2016-12-22: 12:00:00 via INTRAVENOUS
  Filled 2016-12-22: qty 5

## 2016-12-22 NOTE — Assessment & Plan Note (Signed)
Patient presented to the [facility] today to receive her first cycle of carboplatin/Alimta/Avastin chemotherapy regimen.  She has a history of some chest pressure when she receives any type of steroids.  She complained of some mild chest discomfort/pressure.  Following the administration of the dexamethasone as a premedication prior to her chemotherapy today.  The Avastin was held-and patient received Benadryl 25 mg and Pepcid 20 mg-which completely relieved all of patient's symptoms.  Patient was able to proceed with her infusion today as planned.

## 2016-12-22 NOTE — Assessment & Plan Note (Signed)
Patient presented to the [facility] today for cycle 1 of carboplatin/Alimta/Avastin chemotherapy regimen.  See further notes for details of today's visit.  Patient is scheduled to return on 01/25/2017 for follow-up visit.

## 2016-12-22 NOTE — Progress Notes (Signed)
SYMPTOM MANAGEMENT CLINIC    Chief Complaint: Hypersensitivity reaction  HPI:  Karen Vasquez 66 y.o. female diagnosed with lung cancer.  Currently undergoing carboplatin/Alimta/Avastin.  Chemotherapy therapy regimen.    No history exists.    Review of Systems  Cardiovascular: Positive for chest pain.  All other systems reviewed and are negative.   Past Medical History:  Diagnosis Date  . Adenocarcinoma of left lung, stage 4 ([place]) 12/16/2016  . Arthritis   . Asthma due to seasonal allergies   . Encounter for antineoplastic chemotherapy 12/17/2016  . GERD (gastroesophageal reflux disease)   . Goals of care, counseling/discussion 12/17/2016  . Hernia of abdominal wall    umbilical  . No pertinent past medical history    COLD UPPER RESP STARTED [facility] 6/14  . Recurrent upper respiratory infection (URI)    recent sinus inf  . Thyroid nodule   . Urine blood    hematuria    Past Surgical History:  Procedure Laterality Date  . ABDOMINAL HYSTERECTOMY  08   vag, rectocele repair , bladder sling  . ANTERIOR CERVICAL DECOMP/DISCECTOMY FUSION  05/09/2012   Procedure: ANTERIOR CERVICAL DECOMPRESSION/DISCECTOMY FUSION 1 LEVEL;  Surgeon: Eustace Moore, MD;  Location: [facility];  Service: Neurosurgery;  Laterality: N/A;  anterior cervical decompression fusion cervical five-six  . BREAST CYST EXCISION  96   fibroadenoma lft  . BREAST SURGERY     03  . CARPAL TUNNEL RELEASE  05   bil  . DILATION AND CURETTAGE OF UTERUS  03    polyps  . TUBAL LIGATION  88    has Unilateral vocal cord paralysis; Liver mass; Adenocarcinoma of left lung, stage 4 ([place]); Goals of care, counseling/discussion; Encounter for antineoplastic chemotherapy; and Hypersensitivity reaction on her problem list.    is allergic to prednisone; fenofibrate; quinolones; statins; and septra [sulfamethoxazole-trimethoprim].  Allergies as of 12/22/2016      Reactions   Prednisone Other (See Comments)   Steroids give me  chest pain"   Fenofibrate Other (See Comments)   Muscle aches   Quinolones Other (See Comments)   Causes pt to limp after taking medication for a few days.    Statins Other (See Comments)   Muscle pain    Septra [sulfamethoxazole-trimethoprim] Rash      Medication List       Accurate as of 12/22/16  1:38 PM. Always use your most recent med list.          acetaminophen 650 MG CR tablet Commonly known as:  TYLENOL Take 1,300 mg by mouth at bedtime as needed for pain.   acetaminophen 500 MG tablet Commonly known as:  TYLENOL Take 500 mg by mouth every 6 (six) hours as needed. occ 650 2 twice a day   azelastine 0.1 % nasal spray Commonly known as:  ASTELIN Place 1 spray into the nose as needed. Use in each nostril as directed   calcipotriene-betamethasone ointment Commonly known as:  TACLONEX Apply 1 application topically daily. Given samples from Dr office   CALCIUM CITRATE + D3 PO Take 2 tablets by mouth 2 (two) times daily. With magnesium   conjugated estrogens vaginal cream Commonly known as:  PREMARIN Place 1 Applicatorful vaginally daily.   Cranberry 500 MG Tabs Take 1 tablet by mouth 2 (two) times daily.   ezetimibe 10 MG tablet Commonly known as:  ZETIA Take 10 mg by mouth daily.   folic acid 1 MG tablet Commonly known as:  FOLVITE Take  1 tablet (1 mg total) by mouth daily.   ketotifen 0.025 % ophthalmic solution Commonly known as:  ZADITOR Place 1 drop into both eyes 2 (two) times daily.   loratadine 10 MG tablet Commonly known as:  CLARITIN Take 10 mg by mouth daily.   multivitamin tablet Take 1 tablet by mouth daily.   oxyCODONE 5 MG immediate release tablet Commonly known as:  Oxy IR/ROXICODONE Take 1-2 tablets every 4 hours as needed   pantoprazole 40 MG tablet Commonly known as:  PROTONIX Take 40 mg by mouth 2 (two) times daily.   prochlorperazine 10 MG tablet Commonly known as:  COMPAZINE Take 1 tablet (10 mg total) by mouth every 6  (six) hours as needed for nausea or vomiting.   raloxifene 60 MG tablet Commonly known as:  EVISTA Take 60 mg by mouth daily.   sodium chloride 0.65 % nasal spray Commonly known as:  OCEAN Place 1 spray into the nose daily as needed for congestion.   traMADol 50 MG tablet Commonly known as:  ULTRAM Take 50 mg by mouth every 12 (twelve) hours as needed.   trimethoprim 100 MG tablet Commonly known as:  TRIMPEX Take 100 mg by mouth daily.   vitamin C 1000 MG tablet Take 1,000 mg by mouth daily.   Vitamin D 2000 units tablet Take 2,000 Units by mouth daily.        PHYSICAL EXAMINATION  Oncology Vitals 12/22/2016 12/22/2016  Height - -  Weight - -  Weight (lbs) - -  BMI (kg/m2) - -  Temp 99.2 99  Pulse 73 75  Resp - 18  SpO2 94 96  BSA (m2) - -   BP Readings from Last 2 Encounters:  12/22/16 (!) 117/59  12/16/16 115/61    Physical Exam  Constitutional: She is oriented to person, place, and time and well-developed, well-nourished, and in no distress.  HENT:  Head: Normocephalic and atraumatic.  Mouth/Throat: Oropharynx is clear and moist.  Eyes: Conjunctivae and EOM are normal. Pupils are equal, round, and reactive to light. Right eye exhibits no discharge. Left eye exhibits no discharge. No scleral icterus.  Neck: Normal range of motion. Neck supple. No JVD present. No tracheal deviation present. No thyromegaly present.  Cardiovascular: Normal rate, regular rhythm, normal heart sounds and intact distal pulses.   Pulmonary/Chest: Effort normal and breath sounds normal. No respiratory distress. She has no wheezes. She has no rales. She exhibits no tenderness.  Abdominal: Soft. Bowel sounds are normal. She exhibits no distension and no mass. There is no tenderness. There is no rebound and no guarding.  Musculoskeletal: Normal range of motion. She exhibits no edema or tenderness.  Lymphadenopathy:    She has no cervical adenopathy.  Neurological: She is alert and oriented  to person, place, and time. Gait normal.  Skin: Skin is warm and dry. No rash noted. No erythema. No pallor.  Psychiatric: Affect normal.  Nursing note and vitals reviewed.   LABORATORY DATA:. Appointment on 12/22/2016  Component Date Value Ref Range Status  . WBC 12/22/2016 12.3* 3.9 - 10.3 10e3/uL Final  . NEUT# 12/22/2016 8.4* 1.5 - 6.5 10e3/uL Final  . HGB 12/22/2016 12.9  11.6 - 15.9 g/dL Final  . HCT 12/22/2016 39.3  34.8 - 46.6 % Final  . Platelets 12/22/2016 351  145 - 400 10e3/uL Final  . MCV 12/22/2016 87.3  79.5 - 101.0 fL Final  . MCH 12/22/2016 28.7  25.1 - 34.0 pg Final  . MCHC  12/22/2016 32.8  31.5 - 36.0 g/dL Final  . RBC 12/22/2016 4.50  3.70 - 5.45 10e6/uL Final  . RDW 12/22/2016 13.9  11.2 - 14.5 % Final  . lymph# 12/22/2016 2.0  0.9 - 3.3 10e3/uL Final  . MONO# 12/22/2016 1.4* 0.1 - 0.9 10e3/uL Final  . Eosinophils Absolute 12/22/2016 0.4  0.0 - 0.5 10e3/uL Final  . Basophils Absolute 12/22/2016 0.0  0.0 - 0.1 10e3/uL Final  . NEUT% 12/22/2016 68.2  38.4 - 76.8 % Final  . LYMPH% 12/22/2016 16.5  14.0 - 49.7 % Final  . MONO% 12/22/2016 11.5  0.0 - 14.0 % Final  . EOS% 12/22/2016 3.6  0.0 - 7.0 % Final  . BASO% 12/22/2016 0.2  0.0 - 2.0 % Final  . nRBC 12/22/2016 0  0 - 0 % Final  . Sodium 12/22/2016 140  136 - 145 mEq/L Final  . Potassium 12/22/2016 3.9  3.5 - 5.1 mEq/L Final  . Chloride 12/22/2016 103  98 - 109 mEq/L Final  . CO2 12/22/2016 26  22 - 29 mEq/L Final  . Glucose 12/22/2016 114  70 - 140 mg/dl Final  . BUN 12/22/2016 13.2  7.0 - 26.0 mg/dL Final  . Creatinine 12/22/2016 0.8  0.6 - 1.1 mg/dL Final  . Total Bilirubin 12/22/2016 0.39  0.20 - 1.20 mg/dL Final  . Alkaline Phosphatase 12/22/2016 154* 40 - 150 U/L Final  . AST 12/22/2016 29  5 - 34 U/L Final  . ALT 12/22/2016 26  0 - 55 U/L Final  . Total Protein 12/22/2016 7.6  6.4 - 8.3 g/dL Final  . Albumin 12/22/2016 3.4* 3.5 - 5.0 g/dL Final  . Calcium 12/22/2016 10.0  8.4 - 10.4 mg/dL Final  .  Anion Gap 12/22/2016 11  3 - 11 mEq/L Final  . EGFR 12/22/2016 78* >90 ml/min/1.73 m2 Final    RADIOGRAPHIC STUDIES: Mr Jeri Cos Wo Contrast  Result Date: 12/20/2016 CLINICAL DATA:  Metastatic lung cancer EXAM: MRI HEAD WITHOUT AND WITH CONTRAST TECHNIQUE: Multiplanar, multiecho pulse sequences of the brain and surrounding structures were obtained without and with intravenous contrast. CONTRAST:  5m MULTIHANCE GADOBENATE DIMEGLUMINE 529 MG/ML IV SOLN COMPARISON:  None FINDINGS: Brain: There is a focus of diffusion restriction within the posterior left frontal lobe that corresponds to a peripherally contrast-enhancing lesion measuring 6 x 7 mm with mild surrounding hyperintense T2 weighted signal. There are no other intraparenchymal contrast-enhancing lesions. There is no evidence of acute infarct or intracranial hemorrhage. There is multifocal hyperintense T2-weighted signal within the periventricular white matter, most often seen in the setting of chronic microvascular ischemia. There is a small left frontal convexity dural-based mass measuring 7 x 5 mm. The right choroid plexus is asymmetrically enlarged with associated contrast enhancement and diffusion restriction. No hydrocephalus or extra-axial fluid collection. The midline structures are normal. No age advanced or lobar predominant atrophy. Vascular: Major intracranial arterial and venous sinus flow voids are preserved. No evidence of chronic microhemorrhage or amyloid angiopathy. Skull and upper cervical spine: Hyperintense T1 weighted signal lesion in the right frontal calvarium is most likely a hemangioma. Sinuses/Orbits: No fluid levels or advanced mucosal thickening. No mastoid effusion. Normal orbits. IMPRESSION: 1. Small intraparenchymal metastatic lesion within the left frontal lobe with very mild surrounding vasogenic edema. No mass effect or hemorrhage. 2. Asymmetrically enlarged and contrast-enhancing right choroid plexus. In the context of  known widespread metastatic disease, this also is concerning for a metastasis. 3. Small left frontal convexity dural-based mass, most  consistent with meningioma. However, in this scenario, a dural-based metastasis would be difficult to exclude. Recommend attention on follow-up imaging. These results will be called to the ordering clinician or representative by the Radiologist Assistant, and communication documented in the PACS or zVision Dashboard. Electronically Signed   By: Ulyses Jarred M.D.   On: 12/20/2016 20:17    ASSESSMENT/PLAN:    Hypersensitivity reaction Patient presented to the [facility] today to receive her first cycle of carboplatin/Alimta/Avastin chemotherapy regimen.  She has a history of some chest pressure when she receives any type of steroids.  She complained of some mild chest discomfort/pressure.  Following the administration of the dexamethasone as a premedication prior to her chemotherapy today.  The Avastin was held-and patient received Benadryl 25 mg and Pepcid 20 mg-which completely relieved all of patient's symptoms.  Patient was able to proceed with her infusion today as planned.  Adenocarcinoma of left lung, stage 4 [facility]) Patient presented to the [facility] today for cycle 1 of carboplatin/Alimta/Avastin chemotherapy regimen.  See further notes for details of today's visit.  Patient is scheduled to return on 01/25/2017 for follow-up visit.   Patient stated understanding of all instructions; and was in agreement with this plan of care. The patient knows to call the clinic with any problems, questions or concerns.   Total time spent with patient was 15 minutes;  with greater than 75 percent of that time spent in face to face counseling regarding patient's symptoms,  and coordination of care and follow up.  Disclaimer:This dictation was prepared with Dragon/digital dictation along with [employer]. Any transcriptional errors that result from this process  are unintentional.  Drue Second, NP 12/22/2016

## 2016-12-22 NOTE — Patient Instructions (Signed)
[facility] Discharge Instructions for Patients Receiving Chemotherapy  Today you received the following chemotherapy agents:  Avastin, Alimta, Carbo  To help prevent nausea and vomiting after your treatment, we encourage you to take your nausea medication as prescribed.   If you develop nausea and vomiting that is not controlled by your nausea medication, call the clinic.   BELOW ARE SYMPTOMS THAT SHOULD BE REPORTED IMMEDIATELY:  *FEVER GREATER THAN 100.5 F  *CHILLS WITH OR WITHOUT FEVER  NAUSEA AND VOMITING THAT IS NOT CONTROLLED WITH YOUR NAUSEA MEDICATION  *UNUSUAL SHORTNESS OF BREATH  *UNUSUAL BRUISING OR BLEEDING  TENDERNESS IN MOUTH AND THROAT WITH OR WITHOUT PRESENCE OF ULCERS  *URINARY PROBLEMS  *BOWEL PROBLEMS  UNUSUAL RASH Items with * indicate a potential emergency and should be followed up as soon as possible.  Feel free to call the clinic you have any questions or concerns. The clinic phone number is [phone].  Please show the [facility] at check-in to the Emergency Department and triage nurse.      Bevacizumab injection What is this medicine? BEVACIZUMAB (be va SIZ yoo mab) is a monoclonal antibody. It is used to treat cervical cancer, colorectal cancer, glioblastoma multiforme, non-small cell lung cancer (NSCLC), ovarian cancer, and renal cell cancer. This medicine may be used for other purposes; ask your health care provider or pharmacist if you have questions. COMMON BRAND NAME(S): Avastin What should I tell my health care provider before I take this medicine? They need to know if you have any of these conditions: -blood clots -heart disease, including heart failure, heart attack, or chest pain (angina) -high blood pressure -infection (especially a virus infection such as chickenpox, cold sores, or herpes) -kidney disease -lung disease -prior chemotherapy with doxorubicin, daunorubicin, epirubicin, or other anthracycline  type chemotherapy agents -recent or ongoing radiation therapy -recent surgery -stroke -an unusual or allergic reaction to bevacizumab, hamster proteins, mouse proteins, other medicines, foods, dyes, or preservatives -pregnant or trying to get pregnant -breast-feeding How should I use this medicine? This medicine is for infusion into a vein. It is given by a health care professional in a hospital or clinic setting. Talk to your pediatrician regarding the use of this medicine in children. Special care may be needed. Overdosage: If you think you have taken too much of this medicine contact a poison control center or emergency room at once. NOTE: This medicine is only for you. Do not share this medicine with others. What if I miss a dose? It is important not to miss your dose. Call your doctor or health care professional if you are unable to keep an appointment. What may interact with this medicine? Interactions are not expected. This list may not describe all possible interactions. Give your health care provider a list of all the medicines, herbs, non-prescription drugs, or dietary supplements you use. Also tell them if you smoke, drink alcohol, or use illegal drugs. Some items may interact with your medicine. What should I watch for while using this medicine? Your condition will be monitored carefully while you are receiving this medicine. You will need important blood work and urine testing done while you are taking this medicine. During your treatment, let your health care professional know if you have any unusual symptoms, such as difficulty breathing. This medicine may rarely cause 'gastrointestinal perforation' (holes in the stomach, intestines or colon), a serious side effect requiring surgery to repair. This medicine should be started at least 28 days following major  surgery and the site of the surgery should be totally healed. Check with your doctor before scheduling dental work or surgery  while you are receiving this treatment. Talk to your doctor if you have recently had surgery or if you have a wound that has not healed. Do not become pregnant while taking this medicine or for 6 months after stopping it. Women should inform their doctor if they wish to become pregnant or think they might be pregnant. There is a potential for serious side effects to an unborn child. Talk to your health care professional or pharmacist for more information. Do not breast-feed an infant while taking this medicine. This medicine has caused ovarian failure in some women. This medicine may interfere with the ability to have a child. You should talk to your doctor or health care professional if you are concerned about your fertility. What side effects may I notice from receiving this medicine? Side effects that you should report to your doctor or health care professional as soon as possible: -allergic reactions like skin rash, itching or hives, swelling of the face, lips, or tongue -breathing problems -changes in vision -chest pain -confusion -jaw pain, especially after dental work -mouth sores -seizures -severe abdominal pain -severe headache -signs of decreased platelets or bleeding - bruising, pinpoint red spots on the skin, black, tarry stools, nosebleeds, blood in the urine -signs of infection - fever or chills, cough, sore throat, pain or trouble passing urine -sudden numbness or weakness of the face, arm or leg -swelling of legs or ankles -symptoms of a stroke: change in mental awareness, inability to talk or move one side of the body (especially in patients with lung cancer) -trouble passing urine or change in the amount of urine -trouble speaking or understanding -trouble walking, dizziness, loss of balance or coordination Side effects that usually do not require medical attention (report to your doctor or health care professional if they continue or are  bothersome): -constipation -diarrhea -dry skin -headache -loss of appetite -nausea, vomiting This list may not describe all possible side effects. Call your doctor for medical advice about side effects. You may report side effects to FDA at 1-800-FDA-1088. Where should I keep my medicine? This drug is given in a hospital or clinic and will not be stored at home. NOTE: This sheet is a summary. It may not cover all possible information. If you have questions about this medicine, talk to your doctor, pharmacist, or health care provider.  2017 Elsevier/Gold Standard (2015-10-23 15:28:53)     Pemetrexed injection What is this medicine? PEMETREXED (PEM e TREX ed) is a chemotherapy drug. This medicine affects cells that are rapidly growing, such as cancer cells and cells in your mouth and stomach. It is usually used to treat lung cancers like non-small cell lung cancer and mesothelioma. It may also be used to treat other cancers. This medicine may be used for other purposes; ask your health care provider or pharmacist if you have questions. COMMON BRAND NAME(S): Alimta What should I tell my health care provider before I take this medicine? They need to know if you have any of these conditions: -if you frequently drink alcohol containing beverages -infection (especially a virus infection such as chickenpox, cold sores, or herpes) -kidney disease -liver disease -low blood counts, like low platelets, red bloods, or white blood cells -an unusual or allergic reaction to pemetrexed, mannitol, other medicines, foods, dyes, or preservatives -pregnant or trying to get pregnant -breast-feeding How should I use this medicine?  This drug is given as an infusion into a vein. It is administered in a hospital or clinic by a specially trained health care professional. Talk to your pediatrician regarding the use of this medicine in children. Special care may be needed. Overdosage: If you think you have  taken too much of this medicine contact a poison control center or emergency room at once. NOTE: This medicine is only for you. Do not share this medicine with others. What if I miss a dose? It is important not to miss your dose. Call your doctor or health care professional if you are unable to keep an appointment. What may interact with this medicine? -aspirin and aspirin-like medicines -medicines to increase blood counts like filgrastim, pegfilgrastim, sargramostim -methotrexate -NSAIDS, medicines for pain and inflammation, like ibuprofen or naproxen -probenecid -pyrimethamine -vaccines Talk to your doctor or health care professional before taking any of these medicines: -acetaminophen -aspirin -ibuprofen -ketoprofen -naproxen This list may not describe all possible interactions. Give your health care provider a list of all the medicines, herbs, non-prescription drugs, or dietary supplements you use. Also tell them if you smoke, drink alcohol, or use illegal drugs. Some items may interact with your medicine. What should I watch for while using this medicine? Visit your doctor for checks on your progress. This drug may make you feel generally unwell. This is not uncommon, as chemotherapy can affect healthy cells as well as cancer cells. Report any side effects. Continue your course of treatment even though you feel ill unless your doctor tells you to stop. In some cases, you may be given additional medicines to help with side effects. Follow all directions for their use. Call your doctor or health care professional for advice if you get a fever, chills or sore throat, or other symptoms of a cold or flu. Do not treat yourself. This drug decreases your body's ability to fight infections. Try to avoid being around people who are sick. This medicine may increase your risk to bruise or bleed. Call your doctor or health care professional if you notice any unusual bleeding. Be careful brushing and  flossing your teeth or using a toothpick because you may get an infection or bleed more easily. If you have any dental work done, tell your dentist you are receiving this medicine. Avoid taking products that contain aspirin, acetaminophen, ibuprofen, naproxen, or ketoprofen unless instructed by your doctor. These medicines may hide a fever. Call your doctor or health care professional if you get diarrhea or mouth sores. Do not treat yourself. To protect your kidneys, drink water or other fluids as directed while you are taking this medicine. Men and women must use effective birth control while taking this medicine. You may also need to continue using effective birth control for a time after stopping this medicine. Do not become pregnant while taking this medicine. Tell your doctor right away if you think that you or your partner might be pregnant. There is a potential for serious side effects to an unborn child. Talk to your health care professional or pharmacist for more information. Do not breast-feed an infant while taking this medicine. This medicine may lower sperm counts. What side effects may I notice from receiving this medicine? Side effects that you should report to your doctor or health care professional as soon as possible: -allergic reactions like skin rash, itching or hives, swelling of the face, lips, or tongue -low blood counts - this medicine may decrease the number of white blood cells, red  blood cells and platelets. You may be at increased risk for infections and bleeding. -signs of infection - fever or chills, cough, sore throat, pain or difficulty passing urine -signs of decreased platelets or bleeding - bruising, pinpoint red spots on the skin, black, tarry stools, blood in the urine -signs of decreased red blood cells - unusually weak or tired, fainting spells, lightheadedness -breathing problems, like a dry cough -changes in emotions or moods -chest  pain -confusion -diarrhea -high blood pressure -mouth or throat sores or ulcers -pain, swelling, warmth in the leg -pain on swallowing -swelling of the ankles, feet, hands -trouble passing urine or change in the amount of urine -vomiting -yellowing of the eyes or skin Side effects that usually do not require medical attention (report to your doctor or health care professional if they continue or are bothersome): -hair loss -loss of appetite -nausea -stomach upset This list may not describe all possible side effects. Call your doctor for medical advice about side effects. You may report side effects to FDA at 1-800-FDA-1088. Where should I keep my medicine? This drug is given in a hospital or clinic and will not be stored at home. NOTE: This sheet is a summary. It may not cover all possible information. If you have questions about this medicine, talk to your doctor, pharmacist, or health care provider.  2017 Elsevier/Gold Standard (2008-06-03 13:24:03)    Carboplatin injection What is this medicine? CARBOPLATIN (KAR boe pla tin) is a chemotherapy drug. It targets fast dividing cells, like cancer cells, and causes these cells to die. This medicine is used to treat ovarian cancer and many other cancers. This medicine may be used for other purposes; ask your health care provider or pharmacist if you have questions. COMMON BRAND NAME(S): Paraplatin What should I tell my health care provider before I take this medicine? They need to know if you have any of these conditions: -blood disorders -hearing problems -kidney disease -recent or ongoing radiation therapy -an unusual or allergic reaction to carboplatin, cisplatin, other chemotherapy, other medicines, foods, dyes, or preservatives -pregnant or trying to get pregnant -breast-feeding How should I use this medicine? This drug is usually given as an infusion into a vein. It is administered in a hospital or clinic by a specially  trained health care professional. Talk to your pediatrician regarding the use of this medicine in children. Special care may be needed. Overdosage: If you think you have taken too much of this medicine contact a poison control center or emergency room at once. NOTE: This medicine is only for you. Do not share this medicine with others. What if I miss a dose? It is important not to miss a dose. Call your doctor or health care professional if you are unable to keep an appointment. What may interact with this medicine? -medicines for seizures -medicines to increase blood counts like filgrastim, pegfilgrastim, sargramostim -some antibiotics like amikacin, gentamicin, neomycin, streptomycin, tobramycin -vaccines Talk to your doctor or health care professional before taking any of these medicines: -acetaminophen -aspirin -ibuprofen -ketoprofen -naproxen This list may not describe all possible interactions. Give your health care provider a list of all the medicines, herbs, non-prescription drugs, or dietary supplements you use. Also tell them if you smoke, drink alcohol, or use illegal drugs. Some items may interact with your medicine. What should I watch for while using this medicine? Your condition will be monitored carefully while you are receiving this medicine. You will need important blood work done while you are taking  this medicine. This drug may make you feel generally unwell. This is not uncommon, as chemotherapy can affect healthy cells as well as cancer cells. Report any side effects. Continue your course of treatment even though you feel ill unless your doctor tells you to stop. In some cases, you may be given additional medicines to help with side effects. Follow all directions for their use. Call your doctor or health care professional for advice if you get a fever, chills or sore throat, or other symptoms of a cold or flu. Do not treat yourself. This drug decreases your body's ability  to fight infections. Try to avoid being around people who are sick. This medicine may increase your risk to bruise or bleed. Call your doctor or health care professional if you notice any unusual bleeding. Be careful brushing and flossing your teeth or using a toothpick because you may get an infection or bleed more easily. If you have any dental work done, tell your dentist you are receiving this medicine. Avoid taking products that contain aspirin, acetaminophen, ibuprofen, naproxen, or ketoprofen unless instructed by your doctor. These medicines may hide a fever. Do not become pregnant while taking this medicine. Women should inform their doctor if they wish to become pregnant or think they might be pregnant. There is a potential for serious side effects to an unborn child. Talk to your health care professional or pharmacist for more information. Do not breast-feed an infant while taking this medicine. What side effects may I notice from receiving this medicine? Side effects that you should report to your doctor or health care professional as soon as possible: -allergic reactions like skin rash, itching or hives, swelling of the face, lips, or tongue -signs of infection - fever or chills, cough, sore throat, pain or difficulty passing urine -signs of decreased platelets or bleeding - bruising, pinpoint red spots on the skin, black, tarry stools, nosebleeds -signs of decreased red blood cells - unusually weak or tired, fainting spells, lightheadedness -breathing problems -changes in hearing -changes in vision -chest pain -high blood pressure -low blood counts - This drug may decrease the number of white blood cells, red blood cells and platelets. You may be at increased risk for infections and bleeding. -nausea and vomiting -pain, swelling, redness or irritation at the injection site -pain, tingling, numbness in the hands or feet -problems with balance, talking, walking -trouble passing urine  or change in the amount of urine Side effects that usually do not require medical attention (report to your doctor or health care professional if they continue or are bothersome): -hair loss -loss of appetite -metallic taste in the mouth or changes in taste This list may not describe all possible side effects. Call your doctor for medical advice about side effects. You may report side effects to FDA at 1-800-FDA-1088. Where should I keep my medicine? This drug is given in a hospital or clinic and will not be stored at home. NOTE: This sheet is a summary. It may not cover all possible information. If you have questions about this medicine, talk to your doctor, pharmacist, or health care provider.  2017 Elsevier/Gold Standard (2008-02-05 14:38:05)

## 2016-12-22 NOTE — Progress Notes (Signed)
Met w/ pt, introduced myself as her FA and discussed copay assistance.  Pt gave me consent to apply in her behalf.  She is approved w/ [employer] for Avastin from 12/22/16 - 12/21/17 for $25,000.  I emailed a copy of the approval letter to Darrell Jewel and Elmyra Ricks in billing and gave a copy to HIM to scan in pt's chart.  I also faxed [employer] application for Alimita today.  Once I get a response I will notify the pt of the outcome.  Pt is overqualified for the [employer].  She has my card to contact me if she has any questions or concerns in the future.

## 2016-12-22 NOTE — Progress Notes (Signed)
1240: Pt reports 'chest pressure" with no other symptoms. Avastin stopped and NS to gravity, Selena Lesser NP at bedside. Pt states that this is a normal side effect to steroids for her. Medications given per Cyndee Bacon's NP orders.  1310: temp: 99.2, Pt states chest pressure is present but better than before. Avastin restarted per Cyndee Bacon's orders.

## 2016-12-23 ENCOUNTER — Encounter (HOSPITAL_COMMUNITY): Payer: Self-pay

## 2016-12-26 ENCOUNTER — Encounter: Payer: Self-pay | Admitting: Radiation Oncology

## 2016-12-26 ENCOUNTER — Ambulatory Visit
Admission: RE | Admit: 2016-12-26 | Discharge: 2016-12-26 | Disposition: A | Discharge status: Home / Self Care | Payer: BLUE CROSS/BLUE SHIELD | Source: Ambulatory Visit | Attending: Radiation Oncology | Admitting: Radiation Oncology

## 2016-12-26 ENCOUNTER — Encounter: Payer: Self-pay | Admitting: Internal Medicine

## 2016-12-26 VITALS — BP 137/82 | HR 87 | Temp 98.6°F | Resp 18 | Ht 64.0 in | Wt 144.8 lb

## 2016-12-26 DIAGNOSIS — Z9889 Other specified postprocedural states: Secondary | ICD-10-CM | POA: Diagnosis not present

## 2016-12-26 DIAGNOSIS — E041 Nontoxic single thyroid nodule: Secondary | ICD-10-CM | POA: Diagnosis not present

## 2016-12-26 DIAGNOSIS — Z803 Family history of malignant neoplasm of breast: Secondary | ICD-10-CM | POA: Diagnosis not present

## 2016-12-26 DIAGNOSIS — Z79899 Other long term (current) drug therapy: Secondary | ICD-10-CM | POA: Diagnosis not present

## 2016-12-26 DIAGNOSIS — C7951 Secondary malignant neoplasm of bone: Secondary | ICD-10-CM

## 2016-12-26 DIAGNOSIS — C7970 Secondary malignant neoplasm of unspecified adrenal gland: Secondary | ICD-10-CM | POA: Diagnosis not present

## 2016-12-26 DIAGNOSIS — J329 Chronic sinusitis, unspecified: Secondary | ICD-10-CM | POA: Diagnosis not present

## 2016-12-26 DIAGNOSIS — C7802 Secondary malignant neoplasm of left lung: Secondary | ICD-10-CM | POA: Insufficient documentation

## 2016-12-26 DIAGNOSIS — I10 Essential (primary) hypertension: Secondary | ICD-10-CM | POA: Insufficient documentation

## 2016-12-26 DIAGNOSIS — Z90710 Acquired absence of both cervix and uterus: Secondary | ICD-10-CM | POA: Insufficient documentation

## 2016-12-26 DIAGNOSIS — Z510 Encounter for antineoplastic radiation therapy: Secondary | ICD-10-CM | POA: Diagnosis not present

## 2016-12-26 DIAGNOSIS — Z8042 Family history of malignant neoplasm of prostate: Secondary | ICD-10-CM | POA: Diagnosis not present

## 2016-12-26 DIAGNOSIS — C3492 Malignant neoplasm of unspecified part of left bronchus or lung: Secondary | ICD-10-CM

## 2016-12-26 DIAGNOSIS — K219 Gastro-esophageal reflux disease without esophagitis: Secondary | ICD-10-CM | POA: Diagnosis not present

## 2016-12-26 DIAGNOSIS — C7952 Secondary malignant neoplasm of bone marrow: Principal | ICD-10-CM

## 2016-12-26 DIAGNOSIS — C7972 Secondary malignant neoplasm of left adrenal gland: Secondary | ICD-10-CM

## 2016-12-26 DIAGNOSIS — Z888 Allergy status to other drugs, medicaments and biological substances status: Secondary | ICD-10-CM | POA: Diagnosis not present

## 2016-12-26 DIAGNOSIS — C7931 Secondary malignant neoplasm of brain: Secondary | ICD-10-CM | POA: Insufficient documentation

## 2016-12-26 DIAGNOSIS — J45909 Unspecified asthma, uncomplicated: Secondary | ICD-10-CM | POA: Diagnosis not present

## 2016-12-26 DIAGNOSIS — C787 Secondary malignant neoplasm of liver and intrahepatic bile duct: Secondary | ICD-10-CM | POA: Diagnosis not present

## 2016-12-26 DIAGNOSIS — C3412 Malignant neoplasm of upper lobe, left bronchus or lung: Secondary | ICD-10-CM | POA: Diagnosis not present

## 2016-12-26 DIAGNOSIS — G893 Neoplasm related pain (acute) (chronic): Secondary | ICD-10-CM | POA: Diagnosis not present

## 2016-12-26 MED ORDER — LORAZEPAM 0.5 MG PO TABS: ORAL_TABLET | ORAL | 0 refills | Status: DC

## 2016-12-26 MED ORDER — OXYCODONE HCL 5 MG PO TABS: ORAL_TABLET | ORAL | 0 refills | Status: DC

## 2016-12-26 NOTE — Progress Notes (Signed)
Pt is approved w/ [employer] for Alimta from 12/23/16 to 11/13/17 for $25,000.  Emailed copy of approval letter to Lilian Coma and Elmyra Ricks in billing as well as to HIM to scan in pt's chart.

## 2016-12-26 NOTE — Progress Notes (Signed)
See progress noted under physician encounter.  

## 2016-12-26 NOTE — Progress Notes (Addendum)
Radiation Oncology         [phone] ________________________________  Initial outpatient Consultation  Name: Karen Vasquez MRN: [id number]  Date: 12/26/2016  DOB: 1951-11-03  QQ:[id number] [name], MD  Curt Bears, MD   REFERRING PHYSICIAN: Curt Bears, MD  DIAGNOSIS: The encounter diagnosis was Adenocarcinoma of left lung, stage 4 ([facility]).     ICD-9-CM ICD-10-CM   1. Adenocarcinoma of left lung, stage 4 (HCC) 162.9 C34.92     HISTORY OF PRESENT ILLNESS: Karen Vasquez is a 66 y.o. female seen at the request of Dr. Julien Nordmann for recently diagnosed Stage IVB (T1b, N2, M1c) non-small cell lung cancer. The patient had been complaining of allergy sinusitis, hoarseness of her voice and mild cough since September 2017. She also started having some choking when drinking liquid, as well as tightness of her voice and head congestion as well as hiccups. She was referred to ENT and was seen by Dr. Janace Hoard. 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   She underwent CT of the neck and chest on 11/01/2016 which showed a 1.8 spiculated lesion in the anterior medial left upper lobe and a poorly defined 3.7 cm lesion in the mediastinal prevascular space. This is consistent with primary lung neoplasm with mediastinal and hilar lymphadenopathy. The patient was referred to Dr. Melvyn Novas and a PET was performed on 11/17/2016. This showed widespread FDG avid malignancy. The spiculated nodule in the anterior left upper lobe measured 1.8 x 1.7 x 1.4 cm with maximum SUV of 4.0. There was metastatic disease identified within the mediastinum, posterior to the right and left of the midline. The AP window metastatic lesion measured 2.3 x 3.7 x 3.6 cm with SUV max of 11.1.     There was also FDG avid metastatic disease in the prevascular space within the right peritracheal node and in the subcarinal. There was low level uptake in the bilateral hila right greater than left but was nonspecific. The PET also showed evidence  for metastatic disease to the liver, bilateral adrenal glands, abdominal lymph nodes as well as multiple metastatic bone disease.       There was also a mildly avid left thyroid nodule that was nonspecific.     The patient underwent biopsy of the liver on 11/21/2016 which showed benign liver with steatosis and scattered chronic inflammation. Final pathology on 12/08/2016 of the subxyphoid region of the upper abdominal wall revealed metastatic poorly differentiated adenocarcinoma. The immunophenotype is consistent with a primary lung adenocarcinoma.  The patient underwent Brain MRI on 12/20/2016. This scan showed a small intraparenchymal metastatic lesion within the left frontal lobe with very mild surrounding vasogenic edema, with no mass effect or hemorrhage. Also noted was asymmetrically enlarged and contrast-enhancing right choroid plexus. In the context of known widespread metastatic disease, this is concerning for a metastasis. There was also a small left frontal convexity dural-based mass, most consistent with meningioma. However, in this scenario, a dural-based metastasis would be difficult to exclude.      The patient met with medical oncologist Dr. Julien Nordmann on 12/16/2016. She received first cycle of chemotherapy with carboplatin, Alimta, and avastin on 12/22/2016. She comes today for discussion on the role for palliative radiation therapy in the management of her disease.   PREVIOUS RADIATION THERAPY: No  PAST MEDICAL HISTORY:  Past Medical History:  Diagnosis Date  . Adenocarcinoma of left lung, stage 4 ([place]) 12/16/2016  . Arthritis   . Asthma due to seasonal allergies   . Encounter  for antineoplastic chemotherapy 12/17/2016  . GERD (gastroesophageal reflux disease)   . Goals of care, counseling/discussion 12/17/2016  . Hernia of abdominal wall    umbilical  . No pertinent past medical history    COLD UPPER RESP STARTED [facility]PAK 6/14  . Recurrent upper respiratory infection (URI)     recent sinus inf  . Thyroid nodule   . Urine blood    hematuria      PAST SURGICAL HISTORY: Past Surgical History:  Procedure Laterality Date  . ABDOMINAL HYSTERECTOMY  08   vag, rectocele repair , bladder sling  . ANTERIOR CERVICAL DECOMP/DISCECTOMY FUSION  05/09/2012   Procedure: ANTERIOR CERVICAL DECOMPRESSION/DISCECTOMY FUSION 1 LEVEL;  Surgeon: Eustace Moore, MD;  Location: [facility];  Service: Neurosurgery;  Laterality: N/A;  anterior cervical decompression fusion cervical five-six  . BREAST CYST EXCISION  96   fibroadenoma lft  . BREAST SURGERY     03  . CARPAL TUNNEL RELEASE  05   bil  . DILATION AND CURETTAGE OF UTERUS  03    polyps  . TUBAL LIGATION  88    FAMILY HISTORY:  Family History  Problem Relation Age of Onset  . Cancer Mother     breast  . Cancer Father     prostate  . Muscular dystrophy Son     SOCIAL HISTORY:  Social History   Social History  . Marital status: Married    Spouse name: N/A  . Number of children: N/A  . Years of education: N/A   Occupational History  . Not on file.   Social History Main Topics  . Smoking status: Never Smoker  . Smokeless tobacco: Never Used  . Alcohol use No  . Drug use: No  . Sexual activity: Not Currently   Other Topics Concern  . Not on file   Social History Narrative  . No narrative on file    ALLERGIES: Prednisone; Fenofibrate; Quinolones; Statins; and Septra [sulfamethoxazole-trimethoprim]  MEDICATIONS:  Current Outpatient Prescriptions  Medication Sig Dispense Refill  . Cholecalciferol (VITAMIN D) 2000 units tablet Take 2,000 Units by mouth daily.    Marland Kitchen conjugated estrogens (PREMARIN) vaginal cream Place 1 Applicatorful vaginally daily.    . Cranberry 500 MG TABS Take 1 tablet by mouth 2 (two) times daily.    Marland Kitchen ezetimibe (ZETIA) 10 MG tablet Take 10 mg by mouth daily.    . folic acid (FOLVITE) 1 MG tablet Take 1 tablet (1 mg total) by mouth daily. 30 tablet 4  . ketotifen (ZADITOR) 0.025  % ophthalmic solution Place 1 drop into both eyes 2 (two) times daily.    Marland Kitchen loratadine (CLARITIN) 10 MG tablet Take 10 mg by mouth daily.    . Multiple Vitamin (MULTIVITAMIN) tablet Take 1 tablet by mouth daily.    Marland Kitchen oxyCODONE (OXY IR/ROXICODONE) 5 MG immediate release tablet Take 1-2 tablets every 4 hours as needed 40 tablet 0  . pantoprazole (PROTONIX) 40 MG tablet Take 40 mg by mouth 2 (two) times daily.     . prochlorperazine (COMPAZINE) 10 MG tablet Take 1 tablet (10 mg total) by mouth every 6 (six) hours as needed for nausea or vomiting. 30 tablet 0  . raloxifene (EVISTA) 60 MG tablet Take 60 mg by mouth daily.    . sodium chloride (OCEAN) 0.65 % nasal spray Place 1 spray into the nose daily as needed for congestion.     . traMADol (ULTRAM) 50 MG tablet Take 50 mg by mouth every  12 (twelve) hours as needed.     . trimethoprim (TRIMPEX) 100 MG tablet Take 100 mg by mouth daily.      No current facility-administered medications for this encounter.     REVIEW OF SYSTEMS:  On review of systems, the patient reports that she is doing well overall. She denies any shortness of breath, fevers, chills, night sweats. She denies hemoptysis. She reports 15-20 lbs weight loss over 2 months. She reports prior to diagnosis she was having GERD thus she decreased her carbo intake and lost some weight. She reports dry cough and hoarseness. These voice changes started in October or November 2017. She reports difficulty to drink because she strangles so easily thus, reports poor oral intake. She reports pain in the mediastinum, mid to low back, and lower portion of posterior ribs. She denies numbness in her toes. She denies any bowel or bladder disturbances, and denies abdominal pain, nausea or vomiting. She takes Miralax regularly. She reports blurry vision in the left eye which started in the last two to three days. She had eye surgery in 2016. She has arthritis. A complete review of systems is obtained and is  otherwise negative.    PHYSICAL EXAM:  Wt Readings from Last 3 Encounters:  12/26/16 144 lb 12.8 oz (65.7 kg)  12/20/16 147 lb (66.7 kg)  12/16/16 148 lb (67.1 kg)   Temp Readings from Last 3 Encounters:  12/26/16 98.6 F (37 C) (Oral)  12/22/16 98.4 F (36.9 C) (Oral)  12/16/16 98.4 F (36.9 C) (Oral)   BP Readings from Last 3 Encounters:  12/26/16 137/82  12/22/16 (!) 153/78  12/16/16 115/61   Pulse Readings from Last 3 Encounters:  12/26/16 87  12/22/16 94  12/16/16 89   Pain Assessment Pain Score: 8  (see progress note)/10  In general this is a thin, appearing caucasian female in no acute distress. She has hoarseness in her voice when speaking. She laid flat on the examination table during our visit to relieve current back pain. She is alert and oriented x4 and appropriate throughout the examination. HEENT reveals that the patient is normocephalic, atraumatic. EOMs are intact. PERRLA. Skin is intact without any evidence of gross lesions. Cardiovascular exam reveals a regular rate and rhythm, no clicks rubs or murmurs are auscultated. Chest is clear to auscultation bilaterally. Lymphatic assessment is performed and does not reveal any adenopathy in the cervical, supraclavicular, axillary, or inguinal chains. Abdomen has active bowel sounds in all quadrants and is intact. The abdomen is soft, non tender, non distended. Lower extremities are negative for pretibial pitting edema, deep calf tenderness, cyanosis or clubbing. She does not have any gait disturbances, and grossly she appears neurologically intact.   KPS = 70  100 - Normal; no complaints; no evidence of disease. 90   - Able to carry on normal activity; minor signs or symptoms of disease. 80   - Normal activity with effort; some signs or symptoms of disease. 35   - Cares for self; unable to carry on normal activity or to do active work. 60   - Requires occasional assistance, but is able to care for most of his  personal needs. 50   - Requires considerable assistance and frequent medical care. 11   - Disabled; requires special care and assistance. 11   - Severely disabled; hospital admission is indicated although death not imminent. 70   - Very sick; hospital admission necessary; active supportive treatment necessary. 10   - Moribund; fatal  processes progressing rapidly. 0     - Dead  Karnofsky DA, Abelmann Mercer, Craver LS and Burchenal JH [phone]) The use of the nitrogen mustards in the palliative treatment of carcinoma: with particular reference to bronchogenic carcinoma Cancer 1 634-56  LABORATORY DATA:  Lab Results  Component Value Date   WBC 12.3 (H) 12/22/2016   HGB 12.9 12/22/2016   HCT 39.3 12/22/2016   MCV 87.3 12/22/2016   PLT 351 12/22/2016   Lab Results  Component Value Date   NA 140 12/22/2016   K 3.9 12/22/2016   CL 101 05/01/2012   CO2 26 12/22/2016   Lab Results  Component Value Date   ALT 26 12/22/2016   AST 29 12/22/2016   ALKPHOS 154 (H) 12/22/2016   BILITOT 0.39 12/22/2016     RADIOGRAPHY: Mr Jeri Cos HX Contrast  Result Date: 12/20/2016 CLINICAL DATA:  Metastatic lung cancer EXAM: MRI HEAD WITHOUT AND WITH CONTRAST TECHNIQUE: Multiplanar, multiecho pulse sequences of the brain and surrounding structures were obtained without and with intravenous contrast. CONTRAST:  67m MULTIHANCE GADOBENATE DIMEGLUMINE 529 MG/ML IV SOLN COMPARISON:  None FINDINGS: Brain: There is a focus of diffusion restriction within the posterior left frontal lobe that corresponds to a peripherally contrast-enhancing lesion measuring 6 x 7 mm with mild surrounding hyperintense T2 weighted signal. There are no other intraparenchymal contrast-enhancing lesions. There is no evidence of acute infarct or intracranial hemorrhage. There is multifocal hyperintense T2-weighted signal within the periventricular white matter, most often seen in the setting of chronic microvascular ischemia. There is a small left  frontal convexity dural-based mass measuring 7 x 5 mm. The right choroid plexus is asymmetrically enlarged with associated contrast enhancement and diffusion restriction. No hydrocephalus or extra-axial fluid collection. The midline structures are normal. No age advanced or lobar predominant atrophy. Vascular: Major intracranial arterial and venous sinus flow voids are preserved. No evidence of chronic microhemorrhage or amyloid angiopathy. Skull and upper cervical spine: Hyperintense T1 weighted signal lesion in the right frontal calvarium is most likely a hemangioma. Sinuses/Orbits: No fluid levels or advanced mucosal thickening. No mastoid effusion. Normal orbits. IMPRESSION: 1. Small intraparenchymal metastatic lesion within the left frontal lobe with very mild surrounding vasogenic edema. No mass effect or hemorrhage. 2. Asymmetrically enlarged and contrast-enhancing right choroid plexus. In the context of known widespread metastatic disease, this also is concerning for a metastasis. 3. Small left frontal convexity dural-based mass, most consistent with meningioma. However, in this scenario, a dural-based metastasis would be difficult to exclude. Recommend attention on follow-up imaging. These results will be called to the ordering clinician or representative by the Radiologist Assistant, and communication documented in the PACS or zVision Dashboard. Electronically Signed   By: KUlyses JarredM.D.   On: 12/20/2016 20:17   [place]  Result Date: 12/08/2016 CLINICAL DATA:  Left upper lobe lung mass with evidence by PET scan of mediastinal lymphadenopathy, hypermetabolic liver lesion, bilateral adrenal masses and subxiphoid abdominal wall metastatic soft tissue nodule/lymph node. Previous ultrasound guided liver biopsy on 11/21/2014 did not reveal evidence of malignancy. She now presents for biopsy of the abdominal wall soft tissue mass. EXAM: ULTRASOUND GUIDED CORE BIOPSY OF ABDOMINAL WALL SOFT TISSUE MASS  COMPARISON:  PET scan on 11/17/2016 and CT of the chest on 11/01/2016. MEDICATIONS: 0.5 mg IV Versed; 25 mcg IV Fentanyl Total Moderate Sedation Time: 10 minutes. The patient's level of consciousness and physiologic status were continuously monitored during the procedure by Radiology nursing. PROCEDURE: The procedure, risks,  benefits, and alternatives were explained to the patient. Questions regarding the procedure were encouraged and answered. The patient understands and consents to the procedure. A time out was performed prior to initiating the procedure. The anterior epigastric region was prepped with chlorhexidine in a sterile fashion, and a sterile drape was applied covering the operative field. A sterile gown and sterile gloves were used for the procedure. Local anesthesia was provided with 1% Lidocaine. Ultrasound was used to localize an abnormal superficial abdominal mass in the left subxiphoid region superficial to the left lobe of the liver. 18 gauge core biopsy samples were obtained. A total of 4 core biopsy samples were obtained and submitted in formalin. Additional ultrasound was performed. COMPLICATIONS: None. FINDINGS: Ultrasound localize is a 1.0 x 1.4 cm soft tissue nodule in the subxiphoid region to the left of midline. This corresponds to the abnormal hypermetabolic soft tissue nodule seen by PET scan. Based on sonographic appearance, this is most likely felt to represent an abnormal metastatic lymph node. Solid tissue was obtained. IMPRESSION: Ultrasound-guided core biopsy performed of a knee 1.0 x 1.4 cm subxiphoid abdominal wall soft tissue nodule superficial to the liver. Based on ultrasound appearance, this is felt to most likely represent an abnormal lymph node. Electronically Signed   By: Aletta Edouard M.D.   On: 12/08/2016 15:28      IMPRESSION/PLAN: 1. 66 y.o. woman with Stage IVB (T1b, N2, M1c) non-small cell lung cancer.  We discussed the natural history of stage IV lung cancer  and general treatment, highlighting the role of palliative radiotherapy in the management.  We discussed the available radiation techniques, and focused on the details of logistics and delivery to the mediastinum, T6 vertebral body, and brain.  We reviewed the anticipated acute and late sequelae associated with palliative radiation in this setting. The patient would like to proceed with palliative radiation and will be scheduled for CT simulation. We recommend a course of 10 fractions over 2 weeks to the chest, Thoracic spine, and sacrum. We reviewed the findings in the femur in addition to visceral disease which would be benefited by her systemic therapy. 2. Brain mets. At this point, the patient would potentially benefit from radiotherapy. The options include whole brain irradiation versus stereotactic radiosurgery. There are pros and cons associated with each of these potential treatment options. Whole brain radiotherapy would treat the known metastatic deposits and help provide some reduction of risk for future brain metastases. However, whole brain radiotherapy carries potential risks including hair loss, subacute somnolence, and neurocognitive changes including a possible reduction in short-term memory. Whole brain radiotherapy also may carry a lower likelihood of tumor control at the treatment sites because of the low-dose used. Stereotactic radiosurgery carries a higher likelihood for local tumor control at the targeted sites with lower associated risk for neurocognitive changes such as memory loss. However, the use of stereotactic radiosurgery in this setting may leave the patient at increased risk for new brain metastases elsewhere in the brain as high as 50-60%. Accordingly, patients who receive stereotactic radiosurgery in this setting should undergo ongoing surveillance imaging with brain MRI more frequently in order to identify and treat new small brain metastases before they become symptomatic.  Stereotactic radiosurgery does carry some different risks, including a risk of radionecrosis. 3. Hoarseness and dysfunction with swallowing. We will refer her for swallowing evaluation with speech therapy, and appreciate their input. 4. Pain management. She is currently taking oxycodone 5 mg once every four hours. She is also taking  tramadol which poorly manages her pain. I have refilled her oxycodone prescription today, and recommended that she discontinue her Tramadol. 5. Claustrophobia. The patient will need another MRI for planning purposes, and we will proceed with 3T with SRS protocol to move forward. A new prescription for Ativan will be provided as well.    The above documentation reflects our direct findings during this shared patient visit.    Carola Rhine, PAC   This document serves as a record of services personally performed by Tyler Pita, MD and Shona Simpson, PA-C. It was created on their behalf by Arlyce Harman, a trained medical scribe. The creation of this record is based on the scribe's personal observations and the provider's statements to them. This document has been checked and approved by the attending provider.

## 2016-12-27 ENCOUNTER — Other Ambulatory Visit: Payer: Self-pay | Admitting: Radiation Therapy

## 2016-12-27 ENCOUNTER — Telehealth: Payer: Self-pay | Admitting: *Deleted

## 2016-12-27 DIAGNOSIS — C7931 Secondary malignant neoplasm of brain: Secondary | ICD-10-CM

## 2016-12-27 DIAGNOSIS — C7949 Secondary malignant neoplasm of other parts of nervous system: Principal | ICD-10-CM

## 2016-12-27 NOTE — Telephone Encounter (Signed)
CALLED PATIENT TO INFORM OF SPEECH THERAPY APPT. ON 12-30-16 - ARRIVAL TIME - 1 PM @ [facility], SPOKE WITH PATIENT AND SHE IS AWARE OF THIS APPT.

## 2016-12-28 ENCOUNTER — Encounter: Payer: Self-pay | Admitting: Internal Medicine

## 2016-12-28 ENCOUNTER — Other Ambulatory Visit: Payer: Self-pay | Admitting: Medical Oncology

## 2016-12-28 ENCOUNTER — Ambulatory Visit
Admission: RE | Admit: 2016-12-28 | Discharge: 2016-12-28 | Disposition: A | Discharge status: Home / Self Care | Payer: BLUE CROSS/BLUE SHIELD | Source: Ambulatory Visit | Attending: Radiation Oncology | Admitting: Radiation Oncology

## 2016-12-28 ENCOUNTER — Telehealth: Payer: Self-pay | Admitting: Internal Medicine

## 2016-12-28 ENCOUNTER — Other Ambulatory Visit (HOSPITAL_BASED_OUTPATIENT_CLINIC_OR_DEPARTMENT_OTHER): Payer: BLUE CROSS/BLUE SHIELD

## 2016-12-28 ENCOUNTER — Ambulatory Visit (HOSPITAL_BASED_OUTPATIENT_CLINIC_OR_DEPARTMENT_OTHER): Payer: BLUE CROSS/BLUE SHIELD | Admitting: Internal Medicine

## 2016-12-28 VITALS — BP 138/74 | HR 83 | Temp 98.9°F | Resp 18 | Ht 64.0 in | Wt 145.9 lb

## 2016-12-28 DIAGNOSIS — C787 Secondary malignant neoplasm of liver and intrahepatic bile duct: Secondary | ICD-10-CM

## 2016-12-28 DIAGNOSIS — E041 Nontoxic single thyroid nodule: Secondary | ICD-10-CM | POA: Diagnosis not present

## 2016-12-28 DIAGNOSIS — K219 Gastro-esophageal reflux disease without esophagitis: Secondary | ICD-10-CM | POA: Diagnosis not present

## 2016-12-28 DIAGNOSIS — C7951 Secondary malignant neoplasm of bone: Secondary | ICD-10-CM

## 2016-12-28 DIAGNOSIS — C3412 Malignant neoplasm of upper lobe, left bronchus or lung: Secondary | ICD-10-CM | POA: Diagnosis not present

## 2016-12-28 DIAGNOSIS — C7972 Secondary malignant neoplasm of left adrenal gland: Secondary | ICD-10-CM | POA: Diagnosis not present

## 2016-12-28 DIAGNOSIS — I10 Essential (primary) hypertension: Secondary | ICD-10-CM | POA: Diagnosis not present

## 2016-12-28 DIAGNOSIS — Z803 Family history of malignant neoplasm of breast: Secondary | ICD-10-CM | POA: Diagnosis not present

## 2016-12-28 DIAGNOSIS — J329 Chronic sinusitis, unspecified: Secondary | ICD-10-CM | POA: Diagnosis not present

## 2016-12-28 DIAGNOSIS — Z90710 Acquired absence of both cervix and uterus: Secondary | ICD-10-CM | POA: Diagnosis not present

## 2016-12-28 DIAGNOSIS — Z8042 Family history of malignant neoplasm of prostate: Secondary | ICD-10-CM | POA: Diagnosis not present

## 2016-12-28 DIAGNOSIS — Z888 Allergy status to other drugs, medicaments and biological substances status: Secondary | ICD-10-CM | POA: Diagnosis not present

## 2016-12-28 DIAGNOSIS — C3492 Malignant neoplasm of unspecified part of left bronchus or lung: Secondary | ICD-10-CM

## 2016-12-28 DIAGNOSIS — C7802 Secondary malignant neoplasm of left lung: Secondary | ICD-10-CM | POA: Diagnosis not present

## 2016-12-28 DIAGNOSIS — C7931 Secondary malignant neoplasm of brain: Secondary | ICD-10-CM | POA: Diagnosis not present

## 2016-12-28 DIAGNOSIS — J45909 Unspecified asthma, uncomplicated: Secondary | ICD-10-CM | POA: Diagnosis not present

## 2016-12-28 DIAGNOSIS — Z510 Encounter for antineoplastic radiation therapy: Secondary | ICD-10-CM | POA: Diagnosis not present

## 2016-12-28 DIAGNOSIS — C7970 Secondary malignant neoplasm of unspecified adrenal gland: Secondary | ICD-10-CM | POA: Diagnosis not present

## 2016-12-28 DIAGNOSIS — Z9889 Other specified postprocedural states: Secondary | ICD-10-CM | POA: Diagnosis not present

## 2016-12-28 LAB — CBC WITH DIFFERENTIAL/PLATELET
BASO%: 0.1 % (ref 0.0–2.0)
Basophils Absolute: 0 10*3/uL (ref 0.0–0.1)
EOS%: 6.8 % (ref 0.0–7.0)
Eosinophils Absolute: 0.5 10*3/uL (ref 0.0–0.5)
HEMATOCRIT: 39 % (ref 34.8–46.6)
HEMOGLOBIN: 13 g/dL (ref 11.6–15.9)
LYMPH#: 2.3 10*3/uL (ref 0.9–3.3)
LYMPH%: 32.7 % (ref 14.0–49.7)
MCH: 28.5 pg (ref 25.1–34.0)
MCHC: 33.3 g/dL (ref 31.5–36.0)
MCV: 85.5 fL (ref 79.5–101.0)
MONO#: 0.6 10*3/uL (ref 0.1–0.9)
MONO%: 7.9 % (ref 0.0–14.0)
NEUT%: 52.5 % (ref 38.4–76.8)
NEUTROS ABS: 3.7 10*3/uL (ref 1.5–6.5)
PLATELETS: 315 10*3/uL (ref 145–400)
RBC: 4.56 10*6/uL (ref 3.70–5.45)
RDW: 13.7 % (ref 11.2–14.5)
WBC: 7 10*3/uL (ref 3.9–10.3)

## 2016-12-28 LAB — COMPREHENSIVE METABOLIC PANEL
ALT: 34 U/L (ref 0–55)
AST: 27 U/L (ref 5–34)
Albumin: 3.4 g/dL — ABNORMAL LOW (ref 3.5–5.0)
Alkaline Phosphatase: 179 U/L — ABNORMAL HIGH (ref 40–150)
Anion Gap: 12 mEq/L — ABNORMAL HIGH (ref 3–11)
BUN: 21.4 mg/dL (ref 7.0–26.0)
CO2: 25 mEq/L (ref 22–29)
Calcium: 9.8 mg/dL (ref 8.4–10.4)
Chloride: 101 mEq/L (ref 98–109)
Creatinine: 0.9 mg/dL (ref 0.6–1.1)
EGFR: 64 mL/min/{1.73_m2} — ABNORMAL LOW (ref >90)
Glucose: 169 mg/dl — ABNORMAL HIGH (ref 70–140)
Potassium: 4.1 mEq/L (ref 3.5–5.1)
Sodium: 138 mEq/L (ref 136–145)
Total Bilirubin: 0.31 mg/dL (ref 0.20–1.20)
Total Protein: 7.8 g/dL (ref 6.4–8.3)

## 2016-12-28 NOTE — Telephone Encounter (Signed)
Appointments scheduled per 2/14 LOS. Patient given AVS report and calendars with future scheduled appointments.

## 2016-12-28 NOTE — Progress Notes (Signed)
  Radiation Oncology         [phone] ________________________________  Name: MADHURI VACCA MRN: [id number]  Date: 12/28/2016  DOB: [birth year]  SIMULATION AND TREATMENT PLANNING NOTE    ICD-9-CM ICD-10-CM   1. Adenocarcinoma of left lung, stage 4 ([facility]) 162.9 C34.92     DIAGNOSIS:  66 yo woman with stage IV adenocarcinoma of the lung with mediastinal adenopathy  NARRATIVE:  The patient was brought to the [facility].  Identity was confirmed.  All relevant records and images related to the planned course of therapy were reviewed.  The patient freely provided informed written consent to proceed with treatment after reviewing the details related to the planned course of therapy. The consent form was witnessed and verified by the simulation staff.  Then, the patient was set-up in a stable reproducible  supine position for radiation therapy.  CT images were obtained.  Surface markings were placed.  The CT images were loaded into the planning software.  Then the target and avoidance structures were contoured.  Treatment planning then occurred.  The radiation prescription was entered and confirmed.  Then, I designed and supervised the construction of a total of 6 medically necessary complex treatment devices, including a BodyFix immobilization mold custom fitted to the patient along with 5 multileaf collimators conformally shaped radiation around the treatment target while shielding critical structures such as the heart and spinal cord maximally.  I have requested : 3D Simulation  I have requested a DVH of the following structures: Left lung, right lung, spinal cord, heart, esophagus, and target.  I have ordered:Nutrition Consult  PLAN:  The patient will receive 35 Gy in 14 fractions.  ________________________________  Sheral Apley Tammi Klippel, M.D.

## 2016-12-28 NOTE — Progress Notes (Signed)
[facility] [phone]) [phone]   Fax:([phone]) [phone]  OFFICE PROGRESS NOTE  [name], MD [address] [place] [address]  DIAGNOSIS: Stage IVb (T1b, N2, M1c) non-small cell lung cancer, adenocarcinoma in a never smoker patient diagnosed in January 2018 and presented with left upper lobe lung mass, mediastinal lymphadenopathy as well as metastatic disease to the bone, liver, adrenal glands as well as abdominal wall nodules and brain metastasis.  PRIOR THERAPY:None.  CURRENT THERAPY: Systemic chemotherapy with carboplatin for AUC of 5, Alimta 500 MG/M2 and Avastin 15 MG/KG every 3 weeks. Status post one cycle. First dose was given 12/22/2016.  INTERVAL HISTORY: Karen Vasquez 66 y.o. female returns to the clinic today for follow-up visit. The patient was started on systemic chemotherapy with carboplatin, Alimta and Avastin last week. She tolerated the first week of her treatment fairly well. She denied having any significant chest pain, shortness breath, cough or hemoptysis. She has no fever or chills. She denied having any nausea or vomiting. She is scheduled to start palliative radiotherapy to the metastatic bone lesion as well as brain soon. She is here today for evaluation and repeat blood work. Blood test for EGFR mutation was reported to be negative but the final results from [facility] one are still pending.  MEDICAL HISTORY: Past Medical History:  Diagnosis Date  . Adenocarcinoma of left lung, stage 4 ([place]) 12/16/2016  . Arthritis   . Asthma due to seasonal allergies   . Encounter for antineoplastic chemotherapy 12/17/2016  . GERD (gastroesophageal reflux disease)   . Goals of care, counseling/discussion 12/17/2016  . Hernia of abdominal wall    umbilical  . No pertinent past medical history    COLD UPPER RESP STARTED ZPAK 6/14  . Recurrent upper respiratory infection (URI)    recent sinus inf  . Thyroid nodule   . Urine blood    hematuria     ALLERGIES:  is allergic to prednisone; fenofibrate; quinolones; statins; and septra [sulfamethoxazole-trimethoprim].  MEDICATIONS:  Current Outpatient Prescriptions  Medication Sig Dispense Refill  . Cholecalciferol (VITAMIN D) 2000 units tablet Take 2,000 Units by mouth daily.    Marland Kitchen conjugated estrogens (PREMARIN) vaginal cream Place 1 Applicatorful vaginally daily.    . Cranberry 500 MG TABS Take 1 tablet by mouth 2 (two) times daily.    Marland Kitchen ezetimibe (ZETIA) 10 MG tablet Take 10 mg by mouth daily.    . folic acid (FOLVITE) 1 MG tablet Take 1 tablet (1 mg total) by mouth daily. 30 tablet 4  . ketotifen (ZADITOR) 0.025 % ophthalmic solution Place 1 drop into both eyes 2 (two) times daily.    Marland Kitchen loratadine (CLARITIN) 10 MG tablet Take 10 mg by mouth daily.    Marland Kitchen LORazepam (ATIVAN) 0.5 MG tablet 1 tablet po 30 minutes prior to radiation or MRI 30 tablet 0  . Multiple Vitamin (MULTIVITAMIN) tablet Take 1 tablet by mouth daily.    Marland Kitchen oxyCODONE (OXY IR/ROXICODONE) 5 MG immediate release tablet Take 1-2 tablets every 4 hours as needed 60 tablet 0  . pantoprazole (PROTONIX) 40 MG tablet Take 40 mg by mouth 2 (two) times daily.     . prochlorperazine (COMPAZINE) 10 MG tablet Take 1 tablet (10 mg total) by mouth every 6 (six) hours as needed for nausea or vomiting. 30 tablet 0  . raloxifene (EVISTA) 60 MG tablet Take 60 mg by mouth daily.    . sodium chloride (OCEAN) 0.65 % nasal  spray Place 1 spray into the nose daily as needed for congestion.     Marland Kitchen trimethoprim (TRIMPEX) 100 MG tablet Take 100 mg by mouth daily.      No current facility-administered medications for this visit.     SURGICAL HISTORY:  Past Surgical History:  Procedure Laterality Date  . ABDOMINAL HYSTERECTOMY  08   vag, rectocele repair , bladder sling  . ANTERIOR CERVICAL DECOMP/DISCECTOMY FUSION  05/09/2012   Procedure: ANTERIOR CERVICAL DECOMPRESSION/DISCECTOMY FUSION 1 LEVEL;  Surgeon: Eustace Moore, MD;  Location: [facility];  Service: Neurosurgery;  Laterality: N/A;  anterior cervical decompression fusion cervical five-six  . BREAST CYST EXCISION  96   fibroadenoma lft  . BREAST SURGERY     03  . CARPAL TUNNEL RELEASE  05   bil  . DILATION AND CURETTAGE OF UTERUS  03    polyps  . TUBAL LIGATION  88    REVIEW OF SYSTEMS:  A comprehensive review of systems was negative except for: Constitutional: positive for fatigue Ears, nose, mouth, throat, and face: positive for hoarseness   PHYSICAL EXAMINATION: General appearance: alert, cooperative, fatigued and no distress Head: Normocephalic, without obvious abnormality, atraumatic Neck: no adenopathy, no JVD, supple, symmetrical, trachea midline and thyroid not enlarged, symmetric, no tenderness/mass/nodules Lymph nodes: Cervical, supraclavicular, and axillary nodes normal. Resp: clear to auscultation bilaterally Back: symmetric, no curvature. ROM normal. No CVA tenderness. Cardio: regular rate and rhythm, S1, S2 normal, no murmur, click, rub or gallop GI: soft, non-tender; bowel sounds normal; no masses,  no organomegaly Extremities: extremities normal, atraumatic, no cyanosis or edema  ECOG PERFORMANCE STATUS: 1 - Symptomatic but completely ambulatory  There were no vitals taken for this visit.  LABORATORY DATA: Lab Results  Component Value Date   WBC 7.0 12/28/2016   HGB 13.0 12/28/2016   HCT 39.0 12/28/2016   MCV 85.5 12/28/2016   PLT 315 12/28/2016      Chemistry      Component Value Date/Time   NA 140 12/22/2016 1007   K 3.9 12/22/2016 1007   CL 101 05/01/2012 0927   CO2 26 12/22/2016 1007   BUN 13.2 12/22/2016 1007   CREATININE 0.8 12/22/2016 1007      Component Value Date/Time   CALCIUM 10.0 12/22/2016 1007   ALKPHOS 154 (H) 12/22/2016 1007   AST 29 12/22/2016 1007   ALT 26 12/22/2016 1007   BILITOT 0.39 12/22/2016 1007       RADIOGRAPHIC STUDIES: Mr Jeri Cos BU Contrast  Result Date: 12/20/2016 CLINICAL DATA:  Metastatic  lung cancer EXAM: MRI HEAD WITHOUT AND WITH CONTRAST TECHNIQUE: Multiplanar, multiecho pulse sequences of the brain and surrounding structures were obtained without and with intravenous contrast. CONTRAST:  44m MULTIHANCE GADOBENATE DIMEGLUMINE 529 MG/ML IV SOLN COMPARISON:  None FINDINGS: Brain: There is a focus of diffusion restriction within the posterior left frontal lobe that corresponds to a peripherally contrast-enhancing lesion measuring 6 x 7 mm with mild surrounding hyperintense T2 weighted signal. There are no other intraparenchymal contrast-enhancing lesions. There is no evidence of acute infarct or intracranial hemorrhage. There is multifocal hyperintense T2-weighted signal within the periventricular white matter, most often seen in the setting of chronic microvascular ischemia. There is a small left frontal convexity dural-based mass measuring 7 x 5 mm. The right choroid plexus is asymmetrically enlarged with associated contrast enhancement and diffusion restriction. No hydrocephalus or extra-axial fluid collection. The midline structures are normal. No age advanced or lobar predominant atrophy. Vascular:  Major intracranial arterial and venous sinus flow voids are preserved. No evidence of chronic microhemorrhage or amyloid angiopathy. Skull and upper cervical spine: Hyperintense T1 weighted signal lesion in the right frontal calvarium is most likely a hemangioma. Sinuses/Orbits: No fluid levels or advanced mucosal thickening. No mastoid effusion. Normal orbits. IMPRESSION: 1. Small intraparenchymal metastatic lesion within the left frontal lobe with very mild surrounding vasogenic edema. No mass effect or hemorrhage. 2. Asymmetrically enlarged and contrast-enhancing right choroid plexus. In the context of known widespread metastatic disease, this also is concerning for a metastasis. 3. Small left frontal convexity dural-based mass, most consistent with meningioma. However, in this scenario, a  dural-based metastasis would be difficult to exclude. Recommend attention on follow-up imaging. These results will be called to the ordering clinician or representative by the Radiologist Assistant, and communication documented in the PACS or zVision Dashboard. Electronically Signed   By: Ulyses Jarred M.D.   On: 12/20/2016 20:17   US Biopsy  Result Date: 12/08/2016 CLINICAL DATA:  Left upper lobe lung mass with evidence by PET scan of mediastinal lymphadenopathy, hypermetabolic liver lesion, bilateral adrenal masses and subxiphoid abdominal wall metastatic soft tissue nodule/lymph node. Previous ultrasound guided liver biopsy on 11/21/2014 did not reveal evidence of malignancy. She now presents for biopsy of the abdominal wall soft tissue mass. EXAM: ULTRASOUND GUIDED CORE BIOPSY OF ABDOMINAL WALL SOFT TISSUE MASS COMPARISON:  PET scan on 11/17/2016 and CT of the chest on 11/01/2016. MEDICATIONS: 0.5 mg IV Versed; 25 mcg IV Fentanyl Total Moderate Sedation Time: 10 minutes. The patient's level of consciousness and physiologic status were continuously monitored during the procedure by Radiology nursing. PROCEDURE: The procedure, risks, benefits, and alternatives were explained to the patient. Questions regarding the procedure were encouraged and answered. The patient understands and consents to the procedure. A time out was performed prior to initiating the procedure. The anterior epigastric region was prepped with chlorhexidine in a sterile fashion, and a sterile drape was applied covering the operative field. A sterile gown and sterile gloves were used for the procedure. Local anesthesia was provided with 1% Lidocaine. Ultrasound was used to localize an abnormal superficial abdominal mass in the left subxiphoid region superficial to the left lobe of the liver. 18 gauge core biopsy samples were obtained. A total of 4 core biopsy samples were obtained and submitted in formalin. Additional ultrasound was  performed. COMPLICATIONS: None. FINDINGS: Ultrasound localize is a 1.0 x 1.4 cm soft tissue nodule in the subxiphoid region to the left of midline. This corresponds to the abnormal hypermetabolic soft tissue nodule seen by PET scan. Based on sonographic appearance, this is most likely felt to represent an abnormal metastatic lymph node. Solid tissue was obtained. IMPRESSION: Ultrasound-guided core biopsy performed of a knee 1.0 x 1.4 cm subxiphoid abdominal wall soft tissue nodule superficial to the liver. Based on ultrasound appearance, this is felt to most likely represent an abnormal lymph node. Electronically Signed   By: Aletta Edouard M.D.   On: 12/08/2016 15:28    ASSESSMENT AND PLAN: This is a very pleasant 66 years old white female recently diagnosed with metastatic non-small cell lung cancer, adenocarcinoma. Molecular studies are still pending. She is currently undergoing systemic chemotherapy with carboplatin, Alimta and Avastin status post 1 cycle. She tolerated the first week of her treatment well with no significant adverse effects. I recommended for the patient to continue her treatment plan as scheduled unless the molecular study showed an actionable mutation. I will see her back  for follow-up visit in 2 weeks for evaluation before starting cycle #2. For the new metastatic brain lesion, she is expected to undergo stereotactic radiotherapy under the care of Dr. Tammi Klippel soon. She was advised to call immediately if she has any concerning symptoms in the interval. The patient voices understanding of current disease status and treatment options and is in agreement with the current care plan.  All questions were answered. The patient knows to call the clinic with any problems, questions or concerns. We can certainly see the patient much sooner if necessary.  I spent 10 minutes counseling the patient face to face. The total time spent in the appointment was 15 minutes.  Disclaimer: This  note was dictated with voice recognition software. Similar sounding words can inadvertently be transcribed and may not be corrected upon review.

## 2016-12-29 ENCOUNTER — Other Ambulatory Visit: Payer: BLUE CROSS/BLUE SHIELD

## 2016-12-29 ENCOUNTER — Telehealth: Payer: Self-pay | Admitting: *Deleted

## 2016-12-29 NOTE — Telephone Encounter (Signed)
Per 2/14 LOS and staff message I have scheduled appts.  Notified the scheduler

## 2016-12-30 ENCOUNTER — Ambulatory Visit: Payer: BLUE CROSS/BLUE SHIELD | Attending: Radiation Oncology

## 2016-12-30 ENCOUNTER — Encounter (HOSPITAL_COMMUNITY): Payer: Self-pay

## 2016-12-30 DIAGNOSIS — R498 Other voice and resonance disorders: Secondary | ICD-10-CM | POA: Insufficient documentation

## 2016-12-30 DIAGNOSIS — R1313 Dysphagia, pharyngeal phase: Secondary | ICD-10-CM | POA: Insufficient documentation

## 2016-12-30 NOTE — Patient Instructions (Signed)
Vocal Fold Adduction Exercises  -COMPLETE 5 reps each, 3x/day (for voice and swallowing)   . Super-supraglottic swallow o Take a breath & hold it,  o Bear down o Swallow, gently cough   . "HA!" with upward pull o Say "ha!" in a louder voice, pulling up on chair   . "HA!" with downward push o Say "ha!" in a louder voice, pushing down on a chair   . Vocal fold adduction o Say "Huh!" with mouth open, & hold your breath for 3 seconds   . Supraglottic swallow o Take a breath & hold it o Swallow - gently cough    These exercises could be done twice a day with increased repetitions (10x15x), if desired.  If voice becomes worse, stop doing these exercises

## 2017-01-01 ENCOUNTER — Ambulatory Visit
Admission: RE | Admit: 2017-01-01 | Discharge: 2017-01-01 | Disposition: A | Discharge status: Home / Self Care | Payer: BLUE CROSS/BLUE SHIELD | Source: Ambulatory Visit | Attending: Radiation Oncology | Admitting: Radiation Oncology

## 2017-01-01 DIAGNOSIS — C715 Malignant neoplasm of cerebral ventricle: Secondary | ICD-10-CM | POA: Diagnosis not present

## 2017-01-01 DIAGNOSIS — C7949 Secondary malignant neoplasm of other parts of nervous system: Principal | ICD-10-CM

## 2017-01-01 DIAGNOSIS — C7931 Secondary malignant neoplasm of brain: Secondary | ICD-10-CM

## 2017-01-01 DIAGNOSIS — C3490 Malignant neoplasm of unspecified part of unspecified bronchus or lung: Secondary | ICD-10-CM | POA: Diagnosis not present

## 2017-01-01 MED ORDER — GADOBENATE DIMEGLUMINE 529 MG/ML IV SOLN
13.0000 mL | Freq: Once | INTRAVENOUS | Status: AC | PRN
  Administered 2017-01-01: 13 mL via INTRAVENOUS

## 2017-01-02 ENCOUNTER — Ambulatory Visit
Admission: RE | Admit: 2017-01-02 | Discharge: 2017-01-02 | Disposition: A | Discharge status: Home / Self Care | Payer: BLUE CROSS/BLUE SHIELD | Source: Ambulatory Visit | Attending: Radiation Oncology | Admitting: Radiation Oncology

## 2017-01-02 ENCOUNTER — Other Ambulatory Visit: Payer: Self-pay | Admitting: Radiation Oncology

## 2017-01-02 ENCOUNTER — Other Ambulatory Visit (HOSPITAL_COMMUNITY): Payer: Self-pay | Admitting: Radiation Oncology

## 2017-01-02 VITALS — BP 124/76 | HR 78 | Temp 99.0°F | Resp 18 | Wt 147.4 lb

## 2017-01-02 DIAGNOSIS — C7970 Secondary malignant neoplasm of unspecified adrenal gland: Secondary | ICD-10-CM | POA: Diagnosis not present

## 2017-01-02 DIAGNOSIS — C787 Secondary malignant neoplasm of liver and intrahepatic bile duct: Secondary | ICD-10-CM | POA: Diagnosis not present

## 2017-01-02 DIAGNOSIS — J45909 Unspecified asthma, uncomplicated: Secondary | ICD-10-CM | POA: Diagnosis not present

## 2017-01-02 DIAGNOSIS — I10 Essential (primary) hypertension: Secondary | ICD-10-CM | POA: Diagnosis not present

## 2017-01-02 DIAGNOSIS — Z90710 Acquired absence of both cervix and uterus: Secondary | ICD-10-CM | POA: Diagnosis not present

## 2017-01-02 DIAGNOSIS — C7931 Secondary malignant neoplasm of brain: Secondary | ICD-10-CM | POA: Diagnosis not present

## 2017-01-02 DIAGNOSIS — J329 Chronic sinusitis, unspecified: Secondary | ICD-10-CM | POA: Diagnosis not present

## 2017-01-02 DIAGNOSIS — Z9889 Other specified postprocedural states: Secondary | ICD-10-CM | POA: Diagnosis not present

## 2017-01-02 DIAGNOSIS — R1310 Dysphagia, unspecified: Secondary | ICD-10-CM

## 2017-01-02 DIAGNOSIS — C3492 Malignant neoplasm of unspecified part of left bronchus or lung: Secondary | ICD-10-CM

## 2017-01-02 DIAGNOSIS — C7802 Secondary malignant neoplasm of left lung: Secondary | ICD-10-CM | POA: Diagnosis not present

## 2017-01-02 DIAGNOSIS — E041 Nontoxic single thyroid nodule: Secondary | ICD-10-CM | POA: Diagnosis not present

## 2017-01-02 DIAGNOSIS — Z803 Family history of malignant neoplasm of breast: Secondary | ICD-10-CM | POA: Diagnosis not present

## 2017-01-02 DIAGNOSIS — C7951 Secondary malignant neoplasm of bone: Secondary | ICD-10-CM | POA: Diagnosis not present

## 2017-01-02 DIAGNOSIS — Z888 Allergy status to other drugs, medicaments and biological substances status: Secondary | ICD-10-CM | POA: Diagnosis not present

## 2017-01-02 DIAGNOSIS — Z510 Encounter for antineoplastic radiation therapy: Secondary | ICD-10-CM | POA: Diagnosis not present

## 2017-01-02 DIAGNOSIS — Z8042 Family history of malignant neoplasm of prostate: Secondary | ICD-10-CM | POA: Diagnosis not present

## 2017-01-02 DIAGNOSIS — K219 Gastro-esophageal reflux disease without esophagitis: Secondary | ICD-10-CM | POA: Diagnosis not present

## 2017-01-02 MED ORDER — OXYCODONE HCL 5 MG PO TABS: 5.0000 mg | ORAL_TABLET | ORAL | 0 refills | Status: DC | PRN

## 2017-01-02 MED ORDER — SODIUM CHLORIDE 0.9% FLUSH
10.0000 mL | Freq: Once | INTRAVENOUS | Status: AC
  Administered 2017-01-02: 10 mL via INTRAVENOUS

## 2017-01-02 NOTE — Progress Notes (Signed)
  Radiation Oncology         [phone] ________________________________  Name: Karen Vasquez MRN: [id number]  Date: 01/02/2017  DOB: 05-04-1951  SIMULATION AND TREATMENT PLANNING NOTE    ICD-9-CM ICD-10-CM   1. Liver metastasis ([facility]) 197.7 C78.7 oxyCODONE (OXY IR/ROXICODONE) 5 MG immediate release tablet  2. Brain metastasis ([place]) 198.3 C79.31     DIAGNOSIS:  66 yo woman with adenocarcinoma of the left lung with 2 intraparenchymal brain mets, one choroid plexus met and one dural based lesion suspicious for meningioma.    NARRATIVE:  The patient was brought to the [facility].  Identity was confirmed.  All relevant records and images related to the planned course of therapy were reviewed.  The patient freely provided informed written consent to proceed with treatment after reviewing the details related to the planned course of therapy. The consent form was witnessed and verified by the simulation staff. Intravenous access was established for contrast administration. Then, the patient was set-up in a stable reproducible supine position for radiation therapy.  A relocatable thermoplastic stereotactic head frame was fabricated for precise immobilization.  CT images were obtained.  Surface markings were placed.  The CT images were loaded into the planning software and fused with the patient's targeting MRI scan.  Then the target and avoidance structures were contoured.  Treatment planning then occurred.  The radiation prescription was entered and confirmed.  I have requested 3D planning  I have requested a DVH of the following structures: Brain stem, brain, left eye, right eye, lenses, optic chiasm, target volumes, uninvolved brain, and normal tissue.    SPECIAL TREATMENT PROCEDURE:  The planned course of therapy using radiation constitutes a special treatment procedure. Special care is required in the management of this patient for the following reasons. This treatment constitutes a  Special Treatment Procedure for the following reason: High dose per fraction requiring special monitoring for increased toxicities of treatment including daily imaging.  The special nature of the planned course of radiotherapy will require increased physician supervision and oversight to ensure patient's safety with optimal treatment outcomes.  PLAN:  The patient will receive between 18 and 20 Gy in one fraction.  PTV1 Rt Choroid 24 mm PTV2 Lt Frontal 6 mm PTV3 Rt Parietal 2 mm PTV4 Lt Convexity 7 mm  ________________________________  Sheral Apley Tammi Klippel, M.D.

## 2017-01-02 NOTE — Progress Notes (Signed)
Has armband been applied?  yes  Does patient have an allergy to IV contrast dye?: No.   Has patient ever received premedication for IV contrast dye?: No.   Does patient take metformin?: No.  If patient does take metformin when was the last dose: n/a  Date of lab work: 12/28/16 BUN: 21.4 CR: 0.9  IV site: wrist left, condition no redness  Has IV site been added to flowsheet?  Yes.    BP 124/76 (BP Location: Right Arm, Patient Position: Sitting, Cuff Size: Normal)   Pulse 78   Temp 99 F (37.2 C) (Oral)   Resp 18   Wt 147 lb 6.4 oz (66.9 kg)   SpO2 100%   BMI 25.30 kg/m

## 2017-01-02 NOTE — Therapy (Signed)
[facility] [address] [place], [place], [address] Phone: [phone]   Fax:  [phone]  Speech Language Pathology Evaluation  Patient Details  Name: Karen Vasquez MRN: [id number] Date of Birth: [birth year] Referring Provider: Shona Simpson, PA-C  Encounter Date: 12/30/2016      End of Session - 01/02/17 0958    Visit Number 1   Number of Visits 17   Date for SLP Re-Evaluation 03/10/17   SLP Start Time 6384   SLP Stop Time  1400   SLP Time Calculation (min) 43 min   Activity Tolerance Patient tolerated treatment well      Past Medical History:  Diagnosis Date  . Adenocarcinoma of left lung, stage 4 ([place]) 12/16/2016  . Arthritis   . Asthma due to seasonal allergies   . Encounter for antineoplastic chemotherapy 12/17/2016  . GERD (gastroesophageal reflux disease)   . Goals of care, counseling/discussion 12/17/2016  . Hernia of abdominal wall    umbilical  . No pertinent past medical history    COLD UPPER RESP STARTED ZPAK 6/14  . Recurrent upper respiratory infection (URI)    recent sinus inf  . Thyroid nodule   . Urine blood    hematuria    Past Surgical History:  Procedure Laterality Date  . ABDOMINAL HYSTERECTOMY  08   vag, rectocele repair , bladder sling  . ANTERIOR CERVICAL DECOMP/DISCECTOMY FUSION  05/09/2012   Procedure: ANTERIOR CERVICAL DECOMPRESSION/DISCECTOMY FUSION 1 LEVEL;  Surgeon: Eustace Moore, MD;  Location: [facility];  Service: Neurosurgery;  Laterality: N/A;  anterior cervical decompression fusion cervical five-six  . BREAST CYST EXCISION  96   fibroadenoma lft  . BREAST SURGERY     03  . CARPAL TUNNEL RELEASE  05   bil  . DILATION AND CURETTAGE OF UTERUS  03    polyps  . TUBAL LIGATION  88    There were no vitals filed for this visit.      Subjective Assessment - 01/02/17 0954    Subjective "When I start the radiation they are hoping that my voice will be better and will  help with pain."   Currently in Pain? Yes   Pain Score 3    Pain Location Back   Pain Orientation Right;Left;Mid   Pain Descriptors / Indicators Sore   Pain Onset More than a month ago   Pain Frequency Constant   Aggravating Factors  movement   Pain Relieving Factors medication   Effect of Pain on Daily Activities hard to get out of bed            SLP Evaluation [facility] - 01/02/17 0954      SLP Visit Information   SLP Received On 12/30/16   Referring Provider Shona Simpson, PA-C   Onset Date approx 6 months ago   Medical Diagnosis Stage IV Lung CA     Prior Functional Status   Cognitive/Linguistic Baseline Within functional limits     Cognition   Overall Cognitive Status Within Functional Limits for tasks assessed     Auditory Comprehension   Overall Auditory Comprehension Appears within functional limits for tasks assessed     Verbal Expression   Overall Verbal Expression Appears within functional limits for tasks assessed     Oral Motor/Sensory Function   Overall Oral Motor/Sensory Function Appears within functional limits for tasks assessed   Labial Symmetry Within Functional Limits   Labial Strength Within Functional Limits   Labial Coordination  WFL   Lingual ROM Within Functional Limits   Lingual Symmetry Within Functional Limits   Lingual Strength Within Functional Limits   Lingual Coordination Reduced   Facial Strength Within Functional Limits   Velum Within Functional Limits     Motor Speech   Phonation Breathy;Low vocal intensity   Phonation Impaired  sust /a/=6 seconds; s/z ratio= 3.4   Volume Soft   Pitch High                         SLP Education - 01/02/17 0957    Education provided Yes   Education Details Vocal fold adduction exercises, Modified (MBSS)   Person(s) Educated Patient   Methods Explanation;Demonstration;Verbal cues;Handout   Comprehension Verbalized understanding;Returned demonstration;Verbal cues required;Need  further instruction          SLP Short Term Goals - 01/02/17 1002      SLP SHORT TERM GOAL #1   Title pt will demo vocal adduction (and dysphagia, PRN) HEP with modified independence over two sessions   Time 4   Period Weeks   Status New     SLP SHORT TERM GOAL #2   Title pt will achieve sustained /a/ of average 7.0 seconds   Time 4   Period Weeks   Status New     SLP SHORT TERM GOAL #3   Title pt will demo abdominal breathing in 5 minutes simple conversation   Time 4   Period Weeks   Status New          SLP Long Term Goals - 01/02/17 1003      SLP LONG TERM GOAL #1   Title pt will demo HEP (dysarthria, and dysphagia-PRN) with modified independence over three sessions   Time 8   Period Weeks   Status New     SLP LONG TERM GOAL #2   Title pt will demo abdominal breathing in 10 minutes simple conversation over two sessions   Time 8   Period Weeks   Status New     SLP LONG TERM GOAL #3   Title pt will achieve 7.5 seconds average with sustained /a/    Time 8   Period Weeks   Status New     SLP LONG TERM GOAL #4   Title pt will achieve s/z ratio <3.4 over two sessions   Time 8   Period Weeks   Status New          Plan - 01/02/17 1093    Clinical Impression Statement Pt presents today with mod breathy voice and reported s/s pharyngeal dysphagia due to what patient describes as complications from stage IV lung CA. A modified barium swallow (MBSS) was recommended to referrring PA-C today. She would benefit from skilled ST in order to strengthen vocal fold musculature to combat effects (dysarthria, dysphagia) of reported complications from lung CA.    Speech Therapy Frequency 2x / week   Duration --  8 weeks   Treatment/Interventions Aspiration precaution training;Pharyngeal strengthening exercises;Diet toleration management by SLP;Compensatory techniques;Internal/external aids;Multimodal communcation approach;SLP instruction and feedback;Patient/family  education;Functional tasks  vocal adduction HEP   Potential to Achieve Goals Fair   Potential Considerations Severity of impairments;Co-morbidities   SLP Home Exercise Plan provided today   Consulted and Agree with Plan of Care Patient      Patient will benefit from skilled therapeutic intervention in order to improve the following deficits and impairments:   Other voice and resonance disorders (CODE)  Dysphagia, pharyngeal      G-Codes - 12/31/2016 1650    Functional Assessment Tool Used NOMS   Functional Limitations Voice   Voice Current Status [phone]) At least 40 percent but less than 60 percent impaired, limited or restricted   Voice Goal Status (B1660) At least 20 percent but less than 40 percent impaired, limited or restricted      Problem List Patient Active Problem List   Diagnosis Date Noted  . Brain metastasis ([place]) 12/26/2016  . Bone metastasis ([place]) 12/26/2016  . 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 Hypersensitivity reaction 12/22/2016  . Goals of care, counseling/discussion 12/17/2016  . Encounter for antineoplastic chemotherapy 12/17/2016  . Adenocarcinoma of left lung, stage 4 ([place]) 12/16/2016  . Liver metastasis ([place]) 11/18/2016  . Unilateral vocal cord paralysis 11/10/2016    [facility] ,MS, CCC-SLP   01/02/2017, 10:16 AM  [facility] [address] [place], [place], [address] Phone: [phone]   Fax:  [phone]  Name: KEANDRIA BERROCAL MRN: [id number] Date of Birth: 1950-12-27

## 2017-01-02 NOTE — Progress Notes (Signed)
IV access removed by Mont Dutton. She reports the catheter was intact upon removal. Reports the patient tolerated removal well and she applied a bandaid to the site.

## 2017-01-03 ENCOUNTER — Telehealth: Payer: Self-pay | Admitting: *Deleted

## 2017-01-03 DIAGNOSIS — C7970 Secondary malignant neoplasm of unspecified adrenal gland: Secondary | ICD-10-CM | POA: Diagnosis not present

## 2017-01-03 DIAGNOSIS — K219 Gastro-esophageal reflux disease without esophagitis: Secondary | ICD-10-CM | POA: Diagnosis not present

## 2017-01-03 DIAGNOSIS — Z90710 Acquired absence of both cervix and uterus: Secondary | ICD-10-CM | POA: Diagnosis not present

## 2017-01-03 DIAGNOSIS — C7802 Secondary malignant neoplasm of left lung: Secondary | ICD-10-CM | POA: Diagnosis not present

## 2017-01-03 DIAGNOSIS — Z510 Encounter for antineoplastic radiation therapy: Secondary | ICD-10-CM | POA: Diagnosis not present

## 2017-01-03 DIAGNOSIS — I10 Essential (primary) hypertension: Secondary | ICD-10-CM | POA: Diagnosis not present

## 2017-01-03 DIAGNOSIS — C7931 Secondary malignant neoplasm of brain: Secondary | ICD-10-CM | POA: Diagnosis not present

## 2017-01-03 DIAGNOSIS — C787 Secondary malignant neoplasm of liver and intrahepatic bile duct: Secondary | ICD-10-CM | POA: Diagnosis not present

## 2017-01-03 DIAGNOSIS — Z9889 Other specified postprocedural states: Secondary | ICD-10-CM | POA: Diagnosis not present

## 2017-01-03 DIAGNOSIS — Z8042 Family history of malignant neoplasm of prostate: Secondary | ICD-10-CM | POA: Diagnosis not present

## 2017-01-03 DIAGNOSIS — Z888 Allergy status to other drugs, medicaments and biological substances status: Secondary | ICD-10-CM | POA: Diagnosis not present

## 2017-01-03 DIAGNOSIS — Z803 Family history of malignant neoplasm of breast: Secondary | ICD-10-CM | POA: Diagnosis not present

## 2017-01-03 DIAGNOSIS — J45909 Unspecified asthma, uncomplicated: Secondary | ICD-10-CM | POA: Diagnosis not present

## 2017-01-03 DIAGNOSIS — J329 Chronic sinusitis, unspecified: Secondary | ICD-10-CM | POA: Diagnosis not present

## 2017-01-03 DIAGNOSIS — C7951 Secondary malignant neoplasm of bone: Secondary | ICD-10-CM | POA: Diagnosis not present

## 2017-01-03 DIAGNOSIS — E041 Nontoxic single thyroid nodule: Secondary | ICD-10-CM | POA: Diagnosis not present

## 2017-01-03 NOTE — Telephone Encounter (Signed)
"  I received a report for immunotherapy chemistry analysis for [facility] on MyChart.  When will I hear from Dr. Julien Nordmann about these results and how will this impact my treatment that's already scheduled?  My son printed the results which read I'm targeted 70%.  I will not need chemotherapy but targeted therapy.  Return number [phone]." Will notify Dr. Julien Nordmann.   Advised results are communicted most effectively in person.  Chemotherapy treatment orders can be changed.  Any provider information will be called as ordered.  No further questions.

## 2017-01-03 NOTE — Telephone Encounter (Signed)
We can discuss during her upcoming visit. We can reschedule her chemo or Nat Math for a later day after the visit unless I have availability before that day.

## 2017-01-03 NOTE — Telephone Encounter (Signed)
Per Julien Nordmann I told pt he can give her Keytruda , but does not think it is the best option for her cancer . She wants to know more specific information about chemo vs immunotherapy before 2/28  When she is scheduled for tx. Note to mohamed.

## 2017-01-03 NOTE — Telephone Encounter (Signed)
Called patient to inform of MBS for 01-05-17 @ 1 pm @ [facility], spoke with patient and she is aware of this appt.

## 2017-01-04 ENCOUNTER — Ambulatory Visit
Admission: RE | Admit: 2017-01-04 | Discharge: 2017-01-04 | Disposition: A | Discharge status: Home / Self Care | Payer: BLUE CROSS/BLUE SHIELD | Source: Ambulatory Visit | Attending: Radiation Oncology | Admitting: Radiation Oncology

## 2017-01-04 DIAGNOSIS — K219 Gastro-esophageal reflux disease without esophagitis: Secondary | ICD-10-CM | POA: Diagnosis not present

## 2017-01-04 DIAGNOSIS — J329 Chronic sinusitis, unspecified: Secondary | ICD-10-CM | POA: Diagnosis not present

## 2017-01-04 DIAGNOSIS — Z9889 Other specified postprocedural states: Secondary | ICD-10-CM | POA: Diagnosis not present

## 2017-01-04 DIAGNOSIS — Z888 Allergy status to other drugs, medicaments and biological substances status: Secondary | ICD-10-CM | POA: Diagnosis not present

## 2017-01-04 DIAGNOSIS — Z803 Family history of malignant neoplasm of breast: Secondary | ICD-10-CM | POA: Diagnosis not present

## 2017-01-04 DIAGNOSIS — Z510 Encounter for antineoplastic radiation therapy: Secondary | ICD-10-CM | POA: Diagnosis not present

## 2017-01-04 DIAGNOSIS — E041 Nontoxic single thyroid nodule: Secondary | ICD-10-CM | POA: Diagnosis not present

## 2017-01-04 DIAGNOSIS — Z6825 Body mass index (BMI) 25.0-25.9, adult: Secondary | ICD-10-CM | POA: Diagnosis not present

## 2017-01-04 DIAGNOSIS — Z8042 Family history of malignant neoplasm of prostate: Secondary | ICD-10-CM | POA: Diagnosis not present

## 2017-01-04 DIAGNOSIS — C7970 Secondary malignant neoplasm of unspecified adrenal gland: Secondary | ICD-10-CM | POA: Diagnosis not present

## 2017-01-04 DIAGNOSIS — C7931 Secondary malignant neoplasm of brain: Secondary | ICD-10-CM | POA: Diagnosis not present

## 2017-01-04 DIAGNOSIS — R030 Elevated blood-pressure reading, without diagnosis of hypertension: Secondary | ICD-10-CM | POA: Diagnosis not present

## 2017-01-04 DIAGNOSIS — J45909 Unspecified asthma, uncomplicated: Secondary | ICD-10-CM | POA: Diagnosis not present

## 2017-01-04 DIAGNOSIS — C7802 Secondary malignant neoplasm of left lung: Secondary | ICD-10-CM | POA: Diagnosis not present

## 2017-01-04 DIAGNOSIS — Z90710 Acquired absence of both cervix and uterus: Secondary | ICD-10-CM | POA: Diagnosis not present

## 2017-01-04 DIAGNOSIS — C7951 Secondary malignant neoplasm of bone: Secondary | ICD-10-CM | POA: Diagnosis not present

## 2017-01-04 DIAGNOSIS — C787 Secondary malignant neoplasm of liver and intrahepatic bile duct: Secondary | ICD-10-CM | POA: Diagnosis not present

## 2017-01-04 DIAGNOSIS — I10 Essential (primary) hypertension: Secondary | ICD-10-CM | POA: Diagnosis not present

## 2017-01-04 NOTE — Telephone Encounter (Signed)
called pt and left message with appt for Monday at 9 am . I asked her to call back and confirm.

## 2017-01-04 NOTE — Telephone Encounter (Addendum)
Pt confirmed appt with [facility] for Monday. She does not need to see him on wed -appt cancelled.

## 2017-01-05 ENCOUNTER — Ambulatory Visit (HOSPITAL_COMMUNITY)
Admission: RE | Admit: 2017-01-05 | Discharge: 2017-01-05 | Disposition: A | Discharge status: Home / Self Care | Payer: BLUE CROSS/BLUE SHIELD | Source: Ambulatory Visit | Attending: Radiation Oncology | Admitting: Radiation Oncology

## 2017-01-05 ENCOUNTER — Ambulatory Visit
Admission: RE | Admit: 2017-01-05 | Discharge: 2017-01-05 | Disposition: A | Discharge status: Home / Self Care | Payer: BLUE CROSS/BLUE SHIELD | Source: Ambulatory Visit | Attending: Radiation Oncology | Admitting: Radiation Oncology

## 2017-01-05 ENCOUNTER — Other Ambulatory Visit (HOSPITAL_BASED_OUTPATIENT_CLINIC_OR_DEPARTMENT_OTHER): Payer: BLUE CROSS/BLUE SHIELD

## 2017-01-05 DIAGNOSIS — Z888 Allergy status to other drugs, medicaments and biological substances status: Secondary | ICD-10-CM | POA: Diagnosis not present

## 2017-01-05 DIAGNOSIS — J329 Chronic sinusitis, unspecified: Secondary | ICD-10-CM | POA: Diagnosis not present

## 2017-01-05 DIAGNOSIS — R1312 Dysphagia, oropharyngeal phase: Secondary | ICD-10-CM | POA: Insufficient documentation

## 2017-01-05 DIAGNOSIS — Z803 Family history of malignant neoplasm of breast: Secondary | ICD-10-CM | POA: Diagnosis not present

## 2017-01-05 DIAGNOSIS — E041 Nontoxic single thyroid nodule: Secondary | ICD-10-CM | POA: Diagnosis not present

## 2017-01-05 DIAGNOSIS — C3492 Malignant neoplasm of unspecified part of left bronchus or lung: Secondary | ICD-10-CM | POA: Insufficient documentation

## 2017-01-05 DIAGNOSIS — Z8042 Family history of malignant neoplasm of prostate: Secondary | ICD-10-CM | POA: Diagnosis not present

## 2017-01-05 DIAGNOSIS — C7802 Secondary malignant neoplasm of left lung: Secondary | ICD-10-CM | POA: Diagnosis not present

## 2017-01-05 DIAGNOSIS — C787 Secondary malignant neoplasm of liver and intrahepatic bile duct: Secondary | ICD-10-CM | POA: Diagnosis not present

## 2017-01-05 DIAGNOSIS — J45909 Unspecified asthma, uncomplicated: Secondary | ICD-10-CM | POA: Diagnosis not present

## 2017-01-05 DIAGNOSIS — Z90710 Acquired absence of both cervix and uterus: Secondary | ICD-10-CM | POA: Diagnosis not present

## 2017-01-05 DIAGNOSIS — C7970 Secondary malignant neoplasm of unspecified adrenal gland: Secondary | ICD-10-CM | POA: Diagnosis not present

## 2017-01-05 DIAGNOSIS — C7931 Secondary malignant neoplasm of brain: Secondary | ICD-10-CM | POA: Diagnosis not present

## 2017-01-05 DIAGNOSIS — I10 Essential (primary) hypertension: Secondary | ICD-10-CM | POA: Diagnosis not present

## 2017-01-05 DIAGNOSIS — C7951 Secondary malignant neoplasm of bone: Secondary | ICD-10-CM | POA: Diagnosis not present

## 2017-01-05 DIAGNOSIS — R1310 Dysphagia, unspecified: Secondary | ICD-10-CM

## 2017-01-05 DIAGNOSIS — Z9889 Other specified postprocedural states: Secondary | ICD-10-CM | POA: Diagnosis not present

## 2017-01-05 DIAGNOSIS — Z510 Encounter for antineoplastic radiation therapy: Secondary | ICD-10-CM | POA: Diagnosis not present

## 2017-01-05 DIAGNOSIS — K219 Gastro-esophageal reflux disease without esophagitis: Secondary | ICD-10-CM | POA: Diagnosis not present

## 2017-01-05 LAB — CBC WITH DIFFERENTIAL/PLATELET
BASO%: 0.4 % (ref 0.0–2.0)
Basophils Absolute: 0 10*3/uL (ref 0.0–0.1)
EOS%: 3.8 % (ref 0.0–7.0)
Eosinophils Absolute: 0.3 10*3/uL (ref 0.0–0.5)
HEMATOCRIT: 35.7 % (ref 34.8–46.6)
HEMOGLOBIN: 11.9 g/dL (ref 11.6–15.9)
LYMPH#: 2 10*3/uL (ref 0.9–3.3)
LYMPH%: 24.5 % (ref 14.0–49.7)
MCH: 28.7 pg (ref 25.1–34.0)
MCHC: 33.2 g/dL (ref 31.5–36.0)
MCV: 86.4 fL (ref 79.5–101.0)
MONO#: 1.1 10*3/uL — ABNORMAL HIGH (ref 0.1–0.9)
MONO%: 14.2 % — ABNORMAL HIGH (ref 0.0–14.0)
NEUT#: 4.6 10*3/uL (ref 1.5–6.5)
NEUT%: 57.1 % (ref 38.4–76.8)
Platelets: 336 10*3/uL (ref 145–400)
RBC: 4.13 10*6/uL (ref 3.70–5.45)
RDW: 14.5 % (ref 11.2–14.5)
WBC: 8.1 10*3/uL (ref 3.9–10.3)

## 2017-01-05 LAB — COMPREHENSIVE METABOLIC PANEL
ALBUMIN: 3.3 g/dL — AB (ref 3.5–5.0)
ALK PHOS: 179 U/L — AB (ref 40–150)
ALT: 25 U/L (ref 0–55)
AST: 26 U/L (ref 5–34)
Anion Gap: 10 mEq/L (ref 3–11)
BUN: 13.2 mg/dL (ref 7.0–26.0)
CALCIUM: 9.6 mg/dL (ref 8.4–10.4)
CO2: 25 mEq/L (ref 22–29)
CREATININE: 0.8 mg/dL (ref 0.6–1.1)
Chloride: 104 mEq/L (ref 98–109)
EGFR: 78 mL/min/{1.73_m2} — ABNORMAL LOW (ref >90)
Glucose: 112 mg/dl (ref 70–140)
Potassium: 4.1 mEq/L (ref 3.5–5.1)
Sodium: 139 mEq/L (ref 136–145)
Total Bilirubin: 0.36 mg/dL (ref 0.20–1.20)
Total Protein: 7.3 g/dL (ref 6.4–8.3)

## 2017-01-05 LAB — UA PROTEIN, DIPSTICK - CHCC

## 2017-01-06 ENCOUNTER — Ambulatory Visit
Admission: RE | Admit: 2017-01-06 | Discharge: 2017-01-06 | Disposition: A | Discharge status: Home / Self Care | Payer: BLUE CROSS/BLUE SHIELD | Source: Ambulatory Visit | Attending: Radiation Oncology | Admitting: Radiation Oncology

## 2017-01-06 ENCOUNTER — Encounter: Payer: Self-pay | Admitting: Radiation Oncology

## 2017-01-06 VITALS — BP 123/74 | HR 76 | Temp 98.0°F | Resp 18 | Ht 64.0 in | Wt 146.8 lb

## 2017-01-06 DIAGNOSIS — C7802 Secondary malignant neoplasm of left lung: Secondary | ICD-10-CM | POA: Diagnosis not present

## 2017-01-06 DIAGNOSIS — Z510 Encounter for antineoplastic radiation therapy: Secondary | ICD-10-CM | POA: Diagnosis not present

## 2017-01-06 DIAGNOSIS — C7970 Secondary malignant neoplasm of unspecified adrenal gland: Secondary | ICD-10-CM | POA: Diagnosis not present

## 2017-01-06 DIAGNOSIS — J329 Chronic sinusitis, unspecified: Secondary | ICD-10-CM | POA: Diagnosis not present

## 2017-01-06 DIAGNOSIS — C787 Secondary malignant neoplasm of liver and intrahepatic bile duct: Secondary | ICD-10-CM | POA: Diagnosis not present

## 2017-01-06 DIAGNOSIS — J45909 Unspecified asthma, uncomplicated: Secondary | ICD-10-CM | POA: Diagnosis not present

## 2017-01-06 DIAGNOSIS — Z9889 Other specified postprocedural states: Secondary | ICD-10-CM | POA: Diagnosis not present

## 2017-01-06 DIAGNOSIS — Z90710 Acquired absence of both cervix and uterus: Secondary | ICD-10-CM | POA: Diagnosis not present

## 2017-01-06 DIAGNOSIS — I10 Essential (primary) hypertension: Secondary | ICD-10-CM | POA: Diagnosis not present

## 2017-01-06 DIAGNOSIS — E041 Nontoxic single thyroid nodule: Secondary | ICD-10-CM | POA: Diagnosis not present

## 2017-01-06 DIAGNOSIS — C3492 Malignant neoplasm of unspecified part of left bronchus or lung: Secondary | ICD-10-CM | POA: Insufficient documentation

## 2017-01-06 DIAGNOSIS — Z803 Family history of malignant neoplasm of breast: Secondary | ICD-10-CM | POA: Diagnosis not present

## 2017-01-06 DIAGNOSIS — C7931 Secondary malignant neoplasm of brain: Secondary | ICD-10-CM | POA: Diagnosis not present

## 2017-01-06 DIAGNOSIS — Z8042 Family history of malignant neoplasm of prostate: Secondary | ICD-10-CM | POA: Diagnosis not present

## 2017-01-06 DIAGNOSIS — K219 Gastro-esophageal reflux disease without esophagitis: Secondary | ICD-10-CM | POA: Diagnosis not present

## 2017-01-06 DIAGNOSIS — C7951 Secondary malignant neoplasm of bone: Secondary | ICD-10-CM | POA: Diagnosis not present

## 2017-01-06 DIAGNOSIS — Z888 Allergy status to other drugs, medicaments and biological substances status: Secondary | ICD-10-CM | POA: Diagnosis not present

## 2017-01-06 MED ORDER — RADIAPLEXRX EX GEL
Freq: Once | CUTANEOUS | Status: AC
  Administered 2017-01-06: 13:00:00 via TOPICAL

## 2017-01-06 NOTE — Progress Notes (Signed)
Weight and vital signs are stable. Patient teaching reviewed  areas of pertinence such as fatigue, hair loss, skin changes,throat changes, cough and short of breath.  Pt able to give teach back of to pat skin, use unscented/gentle soap, and drink plenty of water,apply Radiaplex bid, avoid applying anything to skin within 4 hours of treatment. Pt demonstrated understanding of information given and will contact nursing with any questions or concerns.   Denies any of the mentioned areas of pertinence as concerns.   Appetite varies at times still trying to eat eating protein bars and gatorade, eating smaller meals more frequently. Wt Readings from Last 3 Encounters:  01/06/17 146 lb 12.8 oz (66.6 kg)  01/02/17 147 lb 6.4 oz (66.9 kg)  12/28/16 145 lb 14.4 oz (66.2 kg)  BP 123/74   Pulse 76   Temp 98 F (36.7 C) (Oral)   Resp 18   Ht '5\' 4"'$  (1.626 m)   Wt 146 lb 12.8 oz (66.6 kg)   SpO2 100%   BMI 25.20 kg/m

## 2017-01-06 NOTE — Addendum Note (Signed)
Encounter addended by: Malena Edman, RN on: 01/06/2017  1:25 PM<BR>    Actions taken: MAR administration accepted

## 2017-01-06 NOTE — Addendum Note (Signed)
Encounter addended by: Malena Edman, RN on: 01/06/2017  1:23 PM<BR>    Actions taken: Visit diagnoses modified, Order list changed, Diagnosis association updated

## 2017-01-06 NOTE — Progress Notes (Signed)
  Radiation Oncology         [phone]   Name: Karen Vasquez MRN: [id number]   Date: 01/06/2017  DOB: [birth year]   Weekly Radiation Therapy Management    ICD-9-CM ICD-10-CM   1. Adenocarcinoma of left lung, stage 4 (HCC) 162.9 C34.92     Current Dose: 7.5 Gy  Planned Dose:  35 Gy  Narrative The patient presents for routine under treatment assessment.  Patient has lower back pain, especially when she gets up from lying or sitting for a long time. She denies skin irritation, cough, or SOB at this time. Appetite varies at times. She is still trying to eat protein bars and drink Gatorade. She is eating smaller meals more frequently. She reports fatigue at times. The patient reports that her insurance company will no longer cover her oxycodone. The patient reports she has 5-6 tablets left.  Set-up films were reviewed. The chart was checked.  Physical Findings  height is '5\' 4"'$  (1.626 m) and weight is 146 lb 12.8 oz (66.6 kg). Her oral temperature is 98 F (36.7 C). Her blood pressure is 123/74 and her pulse is 76. Her respiration is 18 and oxygen saturation is 100%. . Weight essentially stable.  No significant changes.  Impression The patient is tolerating radiation.  Plan Continue treatment as planned. The patient showed me her insurance card. I will forward images of her card to the nursing staff to help the patient have coverage for her oxycodone.         Sheral Apley Tammi Klippel, M.D.  This document serves as a record of services personally performed by Tyler Pita, MD. It was created on his behalf by Darcus Austin, a trained medical scribe. The creation of this record is based on the scribe's personal observations and the provider's statements to them. This document has been checked and approved by the attending provider.

## 2017-01-09 ENCOUNTER — Ambulatory Visit
Admission: RE | Admit: 2017-01-09 | Discharge: 2017-01-09 | Disposition: A | Discharge status: Home / Self Care | Payer: BLUE CROSS/BLUE SHIELD | Source: Ambulatory Visit | Attending: Radiation Oncology | Admitting: Radiation Oncology

## 2017-01-09 ENCOUNTER — Encounter: Payer: Self-pay | Admitting: Internal Medicine

## 2017-01-09 ENCOUNTER — Ambulatory Visit (HOSPITAL_BASED_OUTPATIENT_CLINIC_OR_DEPARTMENT_OTHER): Payer: BLUE CROSS/BLUE SHIELD | Admitting: Internal Medicine

## 2017-01-09 ENCOUNTER — Telehealth: Payer: Self-pay | Admitting: Internal Medicine

## 2017-01-09 ENCOUNTER — Encounter: Payer: Self-pay | Admitting: Radiation Oncology

## 2017-01-09 VITALS — BP 133/85 | HR 85 | Temp 98.9°F | Resp 12

## 2017-01-09 VITALS — BP 150/58 | HR 90 | Temp 99.0°F | Resp 18 | Ht 64.0 in | Wt 146.9 lb

## 2017-01-09 DIAGNOSIS — Z510 Encounter for antineoplastic radiation therapy: Secondary | ICD-10-CM | POA: Diagnosis not present

## 2017-01-09 DIAGNOSIS — C7971 Secondary malignant neoplasm of right adrenal gland: Secondary | ICD-10-CM | POA: Diagnosis not present

## 2017-01-09 DIAGNOSIS — Z5111 Encounter for antineoplastic chemotherapy: Secondary | ICD-10-CM

## 2017-01-09 DIAGNOSIS — C3412 Malignant neoplasm of upper lobe, left bronchus or lung: Secondary | ICD-10-CM | POA: Diagnosis not present

## 2017-01-09 DIAGNOSIS — C7951 Secondary malignant neoplasm of bone: Secondary | ICD-10-CM

## 2017-01-09 DIAGNOSIS — K219 Gastro-esophageal reflux disease without esophagitis: Secondary | ICD-10-CM | POA: Diagnosis not present

## 2017-01-09 DIAGNOSIS — E041 Nontoxic single thyroid nodule: Secondary | ICD-10-CM | POA: Diagnosis not present

## 2017-01-09 DIAGNOSIS — J329 Chronic sinusitis, unspecified: Secondary | ICD-10-CM | POA: Diagnosis not present

## 2017-01-09 DIAGNOSIS — C7931 Secondary malignant neoplasm of brain: Secondary | ICD-10-CM

## 2017-01-09 DIAGNOSIS — Z7189 Other specified counseling: Secondary | ICD-10-CM

## 2017-01-09 DIAGNOSIS — Z9889 Other specified postprocedural states: Secondary | ICD-10-CM | POA: Diagnosis not present

## 2017-01-09 DIAGNOSIS — I10 Essential (primary) hypertension: Secondary | ICD-10-CM

## 2017-01-09 DIAGNOSIS — Z888 Allergy status to other drugs, medicaments and biological substances status: Secondary | ICD-10-CM | POA: Diagnosis not present

## 2017-01-09 DIAGNOSIS — C7802 Secondary malignant neoplasm of left lung: Secondary | ICD-10-CM | POA: Diagnosis not present

## 2017-01-09 DIAGNOSIS — C787 Secondary malignant neoplasm of liver and intrahepatic bile duct: Secondary | ICD-10-CM

## 2017-01-09 DIAGNOSIS — Z90710 Acquired absence of both cervix and uterus: Secondary | ICD-10-CM | POA: Diagnosis not present

## 2017-01-09 DIAGNOSIS — C7972 Secondary malignant neoplasm of left adrenal gland: Secondary | ICD-10-CM

## 2017-01-09 DIAGNOSIS — Z803 Family history of malignant neoplasm of breast: Secondary | ICD-10-CM | POA: Diagnosis not present

## 2017-01-09 DIAGNOSIS — C3492 Malignant neoplasm of unspecified part of left bronchus or lung: Secondary | ICD-10-CM

## 2017-01-09 DIAGNOSIS — Z8042 Family history of malignant neoplasm of prostate: Secondary | ICD-10-CM | POA: Diagnosis not present

## 2017-01-09 DIAGNOSIS — C7970 Secondary malignant neoplasm of unspecified adrenal gland: Secondary | ICD-10-CM | POA: Diagnosis not present

## 2017-01-09 DIAGNOSIS — J45909 Unspecified asthma, uncomplicated: Secondary | ICD-10-CM | POA: Diagnosis not present

## 2017-01-09 NOTE — Op Note (Signed)
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 Ledon Snare (radiation oncology) prior to treatment.  It showed 3-dimensional radiation distributions overlaid onto the planning CT/MRI image set.  The [facility] for the target structures as well as the organs at risk were reviewed. The documentation of the 3D plan and dosimetry are filed in the radiation oncology EMR.  NARRATIVE:  Karen Vasquez was brought to the TrueBeam stereotactic radiation treatment machine and placed supine on the CT couch. The head frame was applied, and the patient was set up for stereotactic radiosurgery.  Treatment was planned and administered using a single isocenter, multitarget technique.  I was present for the set-up and delivery.  SIMULATION VERIFICATION:  In the couch zero-angle position, the patient underwent Exactrac imaging using the Brainlab system with orthogonal KV images.  These were carefully aligned and repeated to confirm treatment position for each of the isocenters.  The Exactrac snap film verification was repeated at each couch angle.  SPECIAL TREATMENT PROCEDURE: Karen Vasquez received stereotactic radiosurgery to the following targets: 4 targets (right choroid plexus, left convexity, left frontal, right parietal), each less than 3.5 cm, were treated using a single isocenter, multitarget technique using 18 Dynamic Conformal Arcs to a prescription dose of 18 Gy for the right choroid plexus target and 20 Gy for the other 3 targets.  ExacTrac registration was performed for each couch angle.  The 90% isodose line was prescribed for the right choroid plexus  target and 100% isodose line for the other 3 targets.  STEREOTACTIC TREATMENT MANAGEMENT:  Following delivery, the patient was transported to nursing in stable condition and monitored for possible acute effects.  Vital signs were recorded. The patient tolerated treatment without significant acute effects, and was discharged to home in stable condition.    PLAN: Follow-up in one month.

## 2017-01-09 NOTE — Telephone Encounter (Signed)
Patient requested to keep all appointments on Thursday instead of moving to Wednesday. Okay per MM. Appointments already scheduled per treatment plan. Patient given AVS report and calendars with future scheduled appointments.

## 2017-01-09 NOTE — Progress Notes (Signed)
  Radiation Oncology         [phone] ________________________________  Name: Karen Vasquez MRN: [id number]  Date: 01/09/2017  DOB: December 24, 1950  SIMULATION AND TREATMENT PLANNING NOTE    ICD-9-CM ICD-10-CM   1. 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  Identity was confirmed.  All relevant records and images related to the planned course of therapy were reviewed.  The patient freely provided informed written consent to proceed with treatment after reviewing the details related to the planned course of therapy. The consent form was witnessed and verified by the simulation staff.  Then, the patient was set-up in a stable reproducible  supine position for radiation therapy.  CT images were obtained.  Surface markings were placed.  The CT images were loaded into the planning software.  Then the target and avoidance structures were contoured.  Treatment planning then occurred.  The radiation prescription was entered and confirmed.  Then, I designed and supervised the construction of a total of 3 medically necessary complex treatment devices, BodyFix and 2 MLCs.  I have requested : Isodose Plan.    PLAN:  The patient will receive 20 Gy in 5 fractions.  ________________________________  Sheral Apley Tammi Klippel, M.D.  This document serves as a record of services personally performed by Tyler Pita, MD. It was created on his behalf by Bethann Humble, a trained medical scribe. The creation of this record is based on the scribe's personal observations and the provider's statements to them. This document has been checked and approved by the attending provider.

## 2017-01-09 NOTE — Progress Notes (Signed)
[facility] [phone]   [phone]  OFFICE PROGRESS NOTE  [name], MD [address] [place] [address]  DIAGNOSIS: Stage IVb (T1b, N2, M1c) non-small cell lung cancer, adenocarcinoma in a never smoker patient diagnosed in January 2018 and presented with left upper lobe lung mass, mediastinal lymphadenopathy as well as metastatic disease to the bone, liver, adrenal glands as well as abdominal wall nodules and brain metastasis. PDL 1 expression 70%.  Genomic Alterations Identified? KRAS G12V NOTCH2 rearrangement exon 27 TM22 splice site [id number]>K Additional Findings? Microsatellite status MS-Stable Tumor Mutation Burden TMB-Intermediate; 8 Muts/Mb Additional Disease-relevant Genes with No Reportable Alterations Identified? EGFR ALK BRAF MET RET ERBB2 ROS1 PRIOR THERAPY:None.  CURRENT THERAPY: Systemic chemotherapy with carboplatin for AUC of 5, Alimta 500 MG/M2 and Avastin 15 MG/KG every 3 weeks. Status post one cycle. First dose was given 12/22/2016.  INTERVAL HISTORY: SHATOYA ROETS 66 y.o. female follow-up visit accompanied by her brother-in-law. The patient tolerated the first week of her treatment with carboplatin, Alimta and Avastin fairly well. She has mild nausea and fatigue for a few days after the treatment. She denied having any significant chest pain, shortness of breath, cough or hemoptysis. She has no fever or chills. She denied having any weight loss or night sweats. She has no current nausea, vomiting, diarrhea or constipation. Her molecular studies from [facility] showed no actionable mutations like EGFR, ALK, ROS 1 or BRAF mutations. PDL 1 expression was 70%. She is here today for evaluation and discussion of her treatment options.   MEDICAL HISTORY: Past Medical History:  Diagnosis Date  . Adenocarcinoma of left lung, stage 4 ([place]) 12/16/2016  . Arthritis   . Asthma due to seasonal allergies   .  Encounter for antineoplastic chemotherapy 12/17/2016  . GERD (gastroesophageal reflux disease)   . Goals of care, counseling/discussion 12/17/2016  . Hernia of abdominal wall    umbilical  . No pertinent past medical history    COLD UPPER RESP STARTED ZPAK 6/14  . Recurrent upper respiratory infection (URI)    recent sinus inf  . Thyroid nodule   . Urine blood    hematuria    ALLERGIES:  is allergic to prednisone; fenofibrate; quinolones; statins; and septra [sulfamethoxazole-trimethoprim].  MEDICATIONS:  Current Outpatient Prescriptions  Medication Sig Dispense Refill  . Cholecalciferol (VITAMIN D) 2000 units tablet Take 2,000 Units by mouth daily.    Marland Kitchen conjugated estrogens (PREMARIN) vaginal cream Place 1 Applicatorful vaginally daily.    . Cranberry 500 MG TABS Take 1 tablet by mouth 2 (two) times daily.    Marland Kitchen ezetimibe (ZETIA) 10 MG tablet Take 10 mg by mouth daily.    . folic acid (FOLVITE) 1 MG tablet Take 1 tablet (1 mg total) by mouth daily. 30 tablet 4  . ketotifen (ZADITOR) 0.025 % ophthalmic solution Place 1 drop into both eyes 2 (two) times daily.    Marland Kitchen loratadine (CLARITIN) 10 MG tablet Take 10 mg by mouth daily.    Marland Kitchen LORazepam (ATIVAN) 0.5 MG tablet 1 tablet po 30 minutes prior to radiation or MRI 30 tablet 0  . Multiple Vitamin (MULTIVITAMIN) tablet Take 1 tablet by mouth daily.    Marland Kitchen oxyCODONE (OXY IR/ROXICODONE) 5 MG immediate release tablet Take 1-3 tablets (5-15 mg total) by mouth every 4 (four) hours as needed for severe pain. 100 tablet 0  . pantoprazole (PROTONIX) 40 MG tablet Take 40 mg by mouth 2 (two)  times daily.     . prochlorperazine (COMPAZINE) 10 MG tablet Take 1 tablet (10 mg total) by mouth every 6 (six) hours as needed for nausea or vomiting. 30 tablet 0  . raloxifene (EVISTA) 60 MG tablet Take 60 mg by mouth daily.    . sodium chloride (OCEAN) 0.65 % nasal spray Place 1 spray into the nose daily as needed for congestion.     Marland Kitchen trimethoprim (TRIMPEX) 100 MG  tablet Take 100 mg by mouth daily.      No current facility-administered medications for this visit.     SURGICAL HISTORY:  Past Surgical History:  Procedure Laterality Date  . ABDOMINAL HYSTERECTOMY  08   vag, rectocele repair , bladder sling  . ANTERIOR CERVICAL DECOMP/DISCECTOMY FUSION  05/09/2012   Procedure: ANTERIOR CERVICAL DECOMPRESSION/DISCECTOMY FUSION 1 LEVEL;  Surgeon: Eustace Moore, MD;  Location: [facility];  Service: Neurosurgery;  Laterality: N/A;  anterior cervical decompression fusion cervical five-six  . BREAST CYST EXCISION  96   fibroadenoma lft  . BREAST SURGERY     03  . CARPAL TUNNEL RELEASE  05   bil  . DILATION AND CURETTAGE OF UTERUS  03    polyps  . TUBAL LIGATION  88    REVIEW OF SYSTEMS:  Constitutional: positive for fatigue Eyes: negative Ears, nose, mouth, throat, and face: negative Respiratory: negative Cardiovascular: negative Gastrointestinal: negative Genitourinary:negative Integument/breast: negative Hematologic/lymphatic: negative Musculoskeletal:negative Neurological: negative Behavioral/Psych: negative Endocrine: negative Allergic/Immunologic: negative   PHYSICAL EXAMINATION: General appearance: alert, cooperative, fatigued and no distress Head: Normocephalic, without obvious abnormality, atraumatic Neck: no adenopathy, no JVD, supple, symmetrical, trachea midline and thyroid not enlarged, symmetric, no tenderness/mass/nodules Lymph nodes: Cervical, supraclavicular, and axillary nodes normal. Resp: clear to auscultation bilaterally Back: symmetric, no curvature. ROM normal. No CVA tenderness. Cardio: regular rate and rhythm, S1, S2 normal, no murmur, click, rub or gallop GI: soft, non-tender; bowel sounds normal; no masses,  no organomegaly Extremities: extremities normal, atraumatic, no cyanosis or edema Neurologic: Alert and oriented X 3, normal strength and tone. Normal symmetric reflexes. Normal coordination and gait  ECOG  PERFORMANCE STATUS: 1 - Symptomatic but completely ambulatory  Blood pressure (!) 150/58, pulse 90, temperature 99 F (37.2 C), temperature source Oral, resp. rate 18, height 5' 4"  (1.626 m), weight 146 lb 14.4 oz (66.6 kg), SpO2 97 %.  LABORATORY DATA: Lab Results  Component Value Date   WBC 8.1 01/05/2017   HGB 11.9 01/05/2017   HCT 35.7 01/05/2017   MCV 86.4 01/05/2017   PLT 336 01/05/2017      Chemistry      Component Value Date/Time   NA 139 01/05/2017 1031   K 4.1 01/05/2017 1031   CL 101 05/01/2012 0927   CO2 25 01/05/2017 1031   BUN 13.2 01/05/2017 1031   CREATININE 0.8 01/05/2017 1031      Component Value Date/Time   CALCIUM 9.6 01/05/2017 1031   ALKPHOS 179 (H) 01/05/2017 1031   AST 26 01/05/2017 1031   ALT 25 01/05/2017 1031   BILITOT 0.36 01/05/2017 1031       RADIOGRAPHIC STUDIES: Mr Jeri Cos IW Contrast  Result Date: 01/01/2017 CLINICAL DATA:  66 year old female with widely metastatic lung cancer. Are stable and within normal limits. Study for stereotactic surgical planning. Subsequent encounter. EXAM: MRI HEAD WITHOUT AND WITH CONTRAST TECHNIQUE: Multiplanar, multiecho pulse sequences of the brain and surrounding structures were obtained without and with intravenous contrast. CONTRAST:  86m MULTIHANCE GADOBENATE DIMEGLUMINE 529  MG/ML IV SOLN COMPARISON:  Brain MRI without and with contrast 12/20/2016. FINDINGS: Brain: The small rim enhancing posterior left frontal lobe mass seen today on series 10, image 108 demonstrates less intense enhancement now all, but overall stable size at 5-6 mm. The surrounding edema has mildly regressed (series 7, image 35). As before this lesion shows restricted diffusion. There is a new 2-3 mm metastasis in the right parietal lobe on series 10, image 119 with no edema. The left superior frontal convexity round and dural based appearing mass measuring 9 x 13 mm is stable on series 10, image 134. No associated edema. The right choroid  plexus based mass appears slightly smaller (series 13, images 16 and 17 today versus series 13 images 10 and 11 previously). No the contralateral left choroid plexus appears stable. No associated subependymal enhancement elsewhere. No layering intraventricular debris. There is a new curvilinear focus of enhancement in the right central cerebellum measuring about 12 mm in length. There was no evidence of this lesion on the prior study, but there is no definite diffusion abnormality associated, but there is associated linear T2 and FLAIR hyperintensity without a larger area of edema or mass effect. There are also several small foci of restricted diffusion in the posterior left MCA territory (left parietal lobe series 3, images 86 66 through 69) without associated enhancement. No other restricted diffusion. Elsewhere stable gray and white matter signal including multiple scattered central and subcortical white matter foci of nonspecific T2 and FLAIR hyperintensity which are probably small vessel disease related. No chronic cerebral blood products identified. No ventriculomegaly. Patent basilar cisterns. Negative pituitary and cervicomedullary junction. Vascular: Major intracranial vascular flow voids Skull and upper cervical spine: No definite calvarium bone metastasis, there is a right frontal bone benign hemangioma (series 8, image 141). Bone marrow signal at the skullbase and in the visible cervical spine appears normal. Negative visualized cervical spinal cord. Sinuses/Orbits: Stable and negative. Other: Visible internal auditory structures appear normal. Negative scalp soft tissues. IMPRESSION: 1. Two definite brain parenchymal and one very likely choroid plexus metastases. Both the left frontal lobe and and right choroid plexus metastases seen previously appear slightly smaller. Cerebral edema associated with the former has mildly regressed. There is a new 2-3 mm right parietal lobe metastasis, comprising the  second certain brain parenchymal met (series 10, image 119). 2. Stable left superior frontal convexity dural based metastasis versus meningioma. No edema. 3. New 12 mm curvilinear enhancement in the posterior right cerebellum is indeterminate but most resembles a subacute lacunar infarct (series 10, image 37 and series 7, image 11). See also # 4. 4. Several punctate foci of restricted diffusion in the posterior left MCA territory without enhancement are presumably tiny lacunar infarcts. Electronically Signed   By: Genevie Ann M.D.   On: 01/01/2017 19:44   Mr Jeri Cos IE Contrast  Result Date: 12/20/2016 CLINICAL DATA:  Metastatic lung cancer EXAM: MRI HEAD WITHOUT AND WITH CONTRAST TECHNIQUE: Multiplanar, multiecho pulse sequences of the brain and surrounding structures were obtained without and with intravenous contrast. CONTRAST:  57m MULTIHANCE GADOBENATE DIMEGLUMINE 529 MG/ML IV SOLN COMPARISON:  None FINDINGS: Brain: There is a focus of diffusion restriction within the posterior left frontal lobe that corresponds to a peripherally contrast-enhancing lesion measuring 6 x 7 mm with mild surrounding hyperintense T2 weighted signal. There are no other intraparenchymal contrast-enhancing lesions. There is no evidence of acute infarct or intracranial hemorrhage. There is multifocal hyperintense T2-weighted signal within the periventricular white matter,  most often seen in the setting of chronic microvascular ischemia. There is a small left frontal convexity dural-based mass measuring 7 x 5 mm. The right choroid plexus is asymmetrically enlarged with associated contrast enhancement and diffusion restriction. No hydrocephalus or extra-axial fluid collection. The midline structures are normal. No age advanced or lobar predominant atrophy. Vascular: Major intracranial arterial and venous sinus flow voids are preserved. No evidence of chronic microhemorrhage or amyloid angiopathy. Skull and upper cervical spine:  Hyperintense T1 weighted signal lesion in the right frontal calvarium is most likely a hemangioma. Sinuses/Orbits: No fluid levels or advanced mucosal thickening. No mastoid effusion. Normal orbits. IMPRESSION: 1. Small intraparenchymal metastatic lesion within the left frontal lobe with very mild surrounding vasogenic edema. No mass effect or hemorrhage. 2. Asymmetrically enlarged and contrast-enhancing right choroid plexus. In the context of known widespread metastatic disease, this also is concerning for a metastasis. 3. Small left frontal convexity dural-based mass, most consistent with meningioma. However, in this scenario, a dural-based metastasis would be difficult to exclude. Recommend attention on follow-up imaging. These results will be called to the ordering clinician or representative by the Radiologist Assistant, and communication documented in the PACS or zVision Dashboard. Electronically Signed   By: Ulyses Jarred M.D.   On: 12/20/2016 20:17   Dg Swallowing Func-speech Pathology  Result Date: 01/05/2017 Objective Swallowing Evaluation: Type of Study: MBS-Modified Barium Swallow Study Patient Details Name: LATERRICA LIBMAN MRN: [id number] Date of Birth: [birth year] Today's Date: 01/05/2017 Time: 50 Past Medical History: Past Medical History: Diagnosis Date . Adenocarcinoma of left lung, stage 4 ([place]) 12/16/2016 . Arthritis  . Asthma due to seasonal allergies  . Encounter for antineoplastic chemotherapy 12/17/2016 . GERD (gastroesophageal reflux disease)  . Goals of care, counseling/discussion 12/17/2016 . Hernia of abdominal wall   umbilical . No pertinent past medical history   COLD UPPER RESP STARTED ZPAK 6/14 . Recurrent upper respiratory infection (URI)   recent sinus inf . Thyroid nodule  . Urine blood   hematuria Past Surgical History: Past Surgical History: Procedure Laterality Date . ABDOMINAL HYSTERECTOMY  08  vag, rectocele repair , bladder sling . ANTERIOR CERVICAL DECOMP/DISCECTOMY FUSION   05/09/2012  Procedure: ANTERIOR CERVICAL DECOMPRESSION/DISCECTOMY FUSION 1 LEVEL;  Surgeon: Eustace Moore, MD;  Location: [facility];  Service: Neurosurgery;  Laterality: N/A;  anterior cervical decompression fusion cervical five-six . BREAST CYST EXCISION  96  fibroadenoma lft . BREAST SURGERY    03 . CARPAL TUNNEL RELEASE  05  bil . DILATION AND CURETTAGE OF UTERUS  03   polyps . TUBAL LIGATION  46 HPI: 66 year old female referred for outpatient MBS to objectively assess swallow function and safety, due to diffficulty with thin liquids, and inability to clear throat. PMH significant for lung CA, s/p 2 radiation treatments (12 remaining), reflux (on PPI2x/day), ACDF, left vocal fold paralysis. Subjective: Pt seen in radiology for outpatient MBS. No family present. Assessment / Plan / Recommendation CHL IP CLINICAL IMPRESSIONS 01/05/2017 Clinical Impression Mild oropharyngeal dysphagia, characterized by piecemeal deglutition of puree and solid consistencies, and delayed swallow across consistencies, with trigger seen at the vallecula. No penetration or aspiration, and no post-swallow residue seen on any po presentation. Esophageal sweep revealed it to be clear. Pt had no difficulty managing barium tablet with water, but reports occasional globus sensation after taking meds. Pt was noted to clear her throat several times during this study, but no airway compromise was observed. Pt has currently been through 2 radiation  treatments for lung CA. She was encouraged to be mindful of possible changes in esophageal motility due to effects of radiation. Pt was also encouraged to minimize distractions, and take small boluses at a slow rate to decrease risk of getting choked. Pt to follow up with outpatient SLP, so will defer additional education to them. SLP Visit Diagnosis Dysphagia, oropharyngeal phase (R13.12) Impact on safety and function Mild aspiration risk   No flowsheet data found.  Prognosis 01/05/2017 Prognosis for  Safe Diet Advancement Good CHL IP DIET RECOMMENDATION 01/05/2017 SLP Diet Recommendations Regular solids;Thin liquid Liquid Administration via Cup;Straw Medication Administration Whole meds with liquid Compensations Minimize environmental distractions;Slow rate;Small sips/bites Postural Changes Seated upright at 90 degrees   CHL IP OTHER RECOMMENDATIONS 01/05/2017 Oral Care Recommendations Oral care BID   CHL IP FOLLOW UP RECOMMENDATIONS 01/05/2017 Follow up Recommendations Outpatient SLP    CHL IP ORAL PHASE 01/05/2017 Oral Phase Impaired Oral - Puree Piecemeal swallowing Oral - Regular Piecemeal swallowing  CHL IP PHARYNGEAL PHASE 01/05/2017 Pharyngeal Phase Impaired Pharyngeal- Thin Cup Delayed swallow initiation-vallecula Pharyngeal- Thin Straw Delayed swallow initiation-vallecula Pharyngeal- Puree Delayed swallow initiation-vallecula Pharyngeal- Regular Delayed swallow initiation-vallecula  CHL IP CERVICAL ESOPHAGEAL PHASE 01/05/2017 Cervical Esophageal Phase WFL CHL IP GO 01/05/2017 Functional Assessment Tool Used (No Data) Functional Limitations Swallowing Swallow Current Status (E5277) CI Swallow Goal Status (O2423) CI Swallow Discharge Status (N3614) CI Celia B. Quentin Ore [facility], CCC-SLP [phone] Shonna Chock 01/05/2017, 1:32 PM    ASSESSMENT AND PLAN:  This is a very pleasant 66 years old white female with a stage IV non-small cell lung cancer, adenocarcinoma in a never smoker patient with no actionable mutations but PDL 1 expression of 70%. She was already started on systemic chemotherapy before the molecular studies are available. I had a lengthy discussion with the patient and her brother-in-law about her current condition and treatment options. I gave her the option of continuing her current systemic chemotherapy with carboplatin, Alimta and Avastin with close monitoring and repeat imaging studies versus switching her treatment to Ketruda (pembrolizumab) 200 MG IV every 3 weeks because of the  high PDL 1 expression. I also explained to the patient that immunotherapy could be used as second line option if she elected to continue on the systemic chemotherapy for now. After a lengthy discussion of the pros and cons of these options, the patient decided to continue on her current systemic chemotherapy with carboplatin, Alimta and Avastin for now. She will start cycle #2 on 01/12/2017. For hypertension she was advised to take her blood pressure medication. For the brain metastasis, the patient will proceed with the stereotactic radiotherapy under the care of Dr. Tammi Klippel. She will come back for follow-up visit in 3 weeks for evaluation and management of any adverse effect of her treatment before starting cycle #3. The patient was advised to call immediately if she has any concerning symptoms in the interval. The patient voices understanding of current disease status and treatment options and is in agreement with the current care plan.  All questions were answered. The patient knows to call the clinic with any problems, questions or concerns. We can certainly see the patient much sooner if necessary. Disclaimer: This note was dictated with voice recognition software. Similar sounding words can inadvertently be transcribed and may not be corrected upon review.

## 2017-01-09 NOTE — Addendum Note (Signed)
Encounter addended by: Jenene Slicker, RN on: 01/09/2017  3:35 PM<BR>    Actions taken: Vitals modified

## 2017-01-09 NOTE — Progress Notes (Signed)
  Radiation Oncology         [phone] ________________________________  Stereotactic Treatment Procedure Note  Name: Karen Vasquez MRN: [id number]  Date: 01/09/2017  DOB: 01/15/1951  SPECIAL TREATMENT PROCEDURE    ICD-9-CM ICD-10-CM   1. Brain metastasis ([place]) 198.3 C79.31     3D TREATMENT PLANNING AND DOSIMETRY:  The patient's radiation plan was reviewed and approved by neurosurgery and radiation oncology prior to treatment.  It showed 3-dimensional radiation distributions overlaid onto the planning CT/MRI image set.  The [facility] for the target structures as well as the organs at risk were reviewed. The documentation of the 3D plan and dosimetry are filed in the radiation oncology EMR.  NARRATIVE:  Karen Vasquez was brought to the TrueBeam stereotactic radiation treatment machine and placed supine on the CT couch. The head frame was applied, and the patient was set up for stereotactic radiosurgery.  Neurosurgery was present for the set-up and delivery  SIMULATION VERIFICATION:  In the couch zero-angle position, the patient underwent Exactrac imaging using the Brainlab system with orthogonal KV images.  These were carefully aligned and repeated to confirm treatment position for each of the isocenters.  The Exactrac snap film verification was repeated at each couch angle.  PROCEDURE: Karen Vasquez received stereotactic radiosurgery to the following targets: Four targets (PTV1 Rt Choroid 24 mm, PTV2 Lt Frontal 6 mm, PTV3 Rt Parietal 2 mm, PTV4 Lt Convexity 7 mm) were treated using 4 Dynamic Conformal Arcs to a prescription dose of 18-20 Gy.  ExacTrac registration was performed for each couch angle.  The 100% isodose line was prescribed.  6 MV X-rays were delivered in the flattening filter free beam mode.  STEREOTACTIC TREATMENT MANAGEMENT:  Following delivery, the patient was transported to nursing in stable condition and monitored for possible acute effects.  Vital signs were recorded. The  patient tolerated treatment without significant acute effects, and was discharged to home in stable condition.    PLAN: Follow-up in one month.  ________________________________  Sheral Apley. Tammi Klippel, M.D.

## 2017-01-10 ENCOUNTER — Ambulatory Visit
Admission: RE | Admit: 2017-01-10 | Discharge: 2017-01-10 | Disposition: A | Discharge status: Home / Self Care | Payer: BLUE CROSS/BLUE SHIELD | Source: Ambulatory Visit | Attending: Radiation Oncology | Admitting: Radiation Oncology

## 2017-01-10 ENCOUNTER — Other Ambulatory Visit: Payer: Self-pay | Admitting: Radiation Therapy

## 2017-01-10 DIAGNOSIS — Z888 Allergy status to other drugs, medicaments and biological substances status: Secondary | ICD-10-CM | POA: Diagnosis not present

## 2017-01-10 DIAGNOSIS — Z90710 Acquired absence of both cervix and uterus: Secondary | ICD-10-CM | POA: Diagnosis not present

## 2017-01-10 DIAGNOSIS — C7951 Secondary malignant neoplasm of bone: Secondary | ICD-10-CM | POA: Diagnosis not present

## 2017-01-10 DIAGNOSIS — C7931 Secondary malignant neoplasm of brain: Secondary | ICD-10-CM

## 2017-01-10 DIAGNOSIS — E041 Nontoxic single thyroid nodule: Secondary | ICD-10-CM | POA: Diagnosis not present

## 2017-01-10 DIAGNOSIS — I10 Essential (primary) hypertension: Secondary | ICD-10-CM | POA: Diagnosis not present

## 2017-01-10 DIAGNOSIS — C7970 Secondary malignant neoplasm of unspecified adrenal gland: Secondary | ICD-10-CM | POA: Diagnosis not present

## 2017-01-10 DIAGNOSIS — C7802 Secondary malignant neoplasm of left lung: Secondary | ICD-10-CM | POA: Diagnosis not present

## 2017-01-10 DIAGNOSIS — J329 Chronic sinusitis, unspecified: Secondary | ICD-10-CM | POA: Diagnosis not present

## 2017-01-10 DIAGNOSIS — K219 Gastro-esophageal reflux disease without esophagitis: Secondary | ICD-10-CM | POA: Diagnosis not present

## 2017-01-10 DIAGNOSIS — J45909 Unspecified asthma, uncomplicated: Secondary | ICD-10-CM | POA: Diagnosis not present

## 2017-01-10 DIAGNOSIS — Z510 Encounter for antineoplastic radiation therapy: Secondary | ICD-10-CM | POA: Diagnosis not present

## 2017-01-10 DIAGNOSIS — C787 Secondary malignant neoplasm of liver and intrahepatic bile duct: Secondary | ICD-10-CM | POA: Diagnosis not present

## 2017-01-10 DIAGNOSIS — Z8042 Family history of malignant neoplasm of prostate: Secondary | ICD-10-CM | POA: Diagnosis not present

## 2017-01-10 DIAGNOSIS — Z803 Family history of malignant neoplasm of breast: Secondary | ICD-10-CM | POA: Diagnosis not present

## 2017-01-10 DIAGNOSIS — Z9889 Other specified postprocedural states: Secondary | ICD-10-CM | POA: Diagnosis not present

## 2017-01-11 ENCOUNTER — Ambulatory Visit: Payer: BLUE CROSS/BLUE SHIELD | Admitting: Internal Medicine

## 2017-01-11 ENCOUNTER — Other Ambulatory Visit (HOSPITAL_BASED_OUTPATIENT_CLINIC_OR_DEPARTMENT_OTHER): Payer: BLUE CROSS/BLUE SHIELD

## 2017-01-11 ENCOUNTER — Ambulatory Visit (HOSPITAL_BASED_OUTPATIENT_CLINIC_OR_DEPARTMENT_OTHER): Payer: BLUE CROSS/BLUE SHIELD

## 2017-01-11 ENCOUNTER — Ambulatory Visit
Admission: RE | Admit: 2017-01-11 | Discharge: 2017-01-11 | Disposition: A | Discharge status: Home / Self Care | Payer: BLUE CROSS/BLUE SHIELD | Source: Ambulatory Visit | Attending: Radiation Oncology | Admitting: Radiation Oncology

## 2017-01-11 VITALS — BP 128/51 | HR 75 | Temp 98.2°F | Resp 18

## 2017-01-11 DIAGNOSIS — C7802 Secondary malignant neoplasm of left lung: Secondary | ICD-10-CM | POA: Diagnosis not present

## 2017-01-11 DIAGNOSIS — C7951 Secondary malignant neoplasm of bone: Secondary | ICD-10-CM | POA: Diagnosis not present

## 2017-01-11 DIAGNOSIS — Z5111 Encounter for antineoplastic chemotherapy: Secondary | ICD-10-CM | POA: Diagnosis not present

## 2017-01-11 DIAGNOSIS — C3492 Malignant neoplasm of unspecified part of left bronchus or lung: Secondary | ICD-10-CM

## 2017-01-11 DIAGNOSIS — Z9889 Other specified postprocedural states: Secondary | ICD-10-CM | POA: Diagnosis not present

## 2017-01-11 DIAGNOSIS — Z5112 Encounter for antineoplastic immunotherapy: Secondary | ICD-10-CM

## 2017-01-11 DIAGNOSIS — Z8042 Family history of malignant neoplasm of prostate: Secondary | ICD-10-CM | POA: Diagnosis not present

## 2017-01-11 DIAGNOSIS — K219 Gastro-esophageal reflux disease without esophagitis: Secondary | ICD-10-CM | POA: Diagnosis not present

## 2017-01-11 DIAGNOSIS — C787 Secondary malignant neoplasm of liver and intrahepatic bile duct: Secondary | ICD-10-CM | POA: Diagnosis not present

## 2017-01-11 DIAGNOSIS — E041 Nontoxic single thyroid nodule: Secondary | ICD-10-CM | POA: Diagnosis not present

## 2017-01-11 DIAGNOSIS — J45909 Unspecified asthma, uncomplicated: Secondary | ICD-10-CM | POA: Diagnosis not present

## 2017-01-11 DIAGNOSIS — Z888 Allergy status to other drugs, medicaments and biological substances status: Secondary | ICD-10-CM | POA: Diagnosis not present

## 2017-01-11 DIAGNOSIS — Z510 Encounter for antineoplastic radiation therapy: Secondary | ICD-10-CM | POA: Diagnosis not present

## 2017-01-11 DIAGNOSIS — Z90710 Acquired absence of both cervix and uterus: Secondary | ICD-10-CM | POA: Diagnosis not present

## 2017-01-11 DIAGNOSIS — C7970 Secondary malignant neoplasm of unspecified adrenal gland: Secondary | ICD-10-CM | POA: Diagnosis not present

## 2017-01-11 DIAGNOSIS — Z803 Family history of malignant neoplasm of breast: Secondary | ICD-10-CM | POA: Diagnosis not present

## 2017-01-11 DIAGNOSIS — J329 Chronic sinusitis, unspecified: Secondary | ICD-10-CM | POA: Diagnosis not present

## 2017-01-11 DIAGNOSIS — C7931 Secondary malignant neoplasm of brain: Secondary | ICD-10-CM | POA: Diagnosis not present

## 2017-01-11 DIAGNOSIS — I10 Essential (primary) hypertension: Secondary | ICD-10-CM | POA: Diagnosis not present

## 2017-01-11 LAB — COMPREHENSIVE METABOLIC PANEL
ALBUMIN: 3.5 g/dL (ref 3.5–5.0)
ALK PHOS: 168 U/L — AB (ref 40–150)
ALT: 19 U/L (ref 0–55)
ANION GAP: 11 meq/L (ref 3–11)
AST: 20 U/L (ref 5–34)
BILIRUBIN TOTAL: 0.35 mg/dL (ref 0.20–1.20)
BUN: 16.7 mg/dL (ref 7.0–26.0)
CALCIUM: 9.7 mg/dL (ref 8.4–10.4)
CO2: 25 mEq/L (ref 22–29)
CREATININE: 1 mg/dL (ref 0.6–1.1)
Chloride: 104 mEq/L (ref 98–109)
EGFR: 62 mL/min/{1.73_m2} — ABNORMAL LOW (ref >90)
Glucose: 141 mg/dl — ABNORMAL HIGH (ref 70–140)
Potassium: 3.9 mEq/L (ref 3.5–5.1)
Sodium: 139 mEq/L (ref 136–145)
TOTAL PROTEIN: 7.7 g/dL (ref 6.4–8.3)

## 2017-01-11 LAB — CBC WITH DIFFERENTIAL/PLATELET
BASO%: 0.3 % (ref 0.0–2.0)
Basophils Absolute: 0 10*3/uL (ref 0.0–0.1)
EOS%: 3.1 % (ref 0.0–7.0)
Eosinophils Absolute: 0.2 10*3/uL (ref 0.0–0.5)
HEMATOCRIT: 37 % (ref 34.8–46.6)
HGB: 12.2 g/dL (ref 11.6–15.9)
LYMPH#: 1.7 10*3/uL (ref 0.9–3.3)
LYMPH%: 23.8 % (ref 14.0–49.7)
MCH: 28.7 pg (ref 25.1–34.0)
MCHC: 33 g/dL (ref 31.5–36.0)
MCV: 87.1 fL (ref 79.5–101.0)
MONO#: 1.3 10*3/uL — AB (ref 0.1–0.9)
MONO%: 18.1 % — ABNORMAL HIGH (ref 0.0–14.0)
NEUT%: 54.7 % (ref 38.4–76.8)
NEUTROS ABS: 3.9 10*3/uL (ref 1.5–6.5)
PLATELETS: 376 10*3/uL (ref 145–400)
RBC: 4.25 10*6/uL (ref 3.70–5.45)
RDW: 15.5 % — ABNORMAL HIGH (ref 11.2–14.5)
WBC: 7.1 10*3/uL (ref 3.9–10.3)

## 2017-01-11 MED ORDER — SODIUM CHLORIDE 0.9 % IV SOLN
Freq: Once | INTRAVENOUS | Status: AC
  Administered 2017-01-11: 12:00:00 via INTRAVENOUS
  Filled 2017-01-11: qty 5

## 2017-01-11 MED ORDER — SODIUM CHLORIDE 0.9 % IV SOLN
Freq: Once | INTRAVENOUS | Status: AC
  Administered 2017-01-11: 12:00:00 via INTRAVENOUS

## 2017-01-11 MED ORDER — SODIUM CHLORIDE 0.9 % IV SOLN
510.0000 mg/m2 | Freq: Once | INTRAVENOUS | Status: AC
  Administered 2017-01-11: 900 mg via INTRAVENOUS
  Filled 2017-01-11: qty 20

## 2017-01-11 MED ORDER — DIPHENHYDRAMINE HCL 50 MG/ML IJ SOLN
INTRAMUSCULAR | Status: AC
  Filled 2017-01-11: qty 1

## 2017-01-11 MED ORDER — SODIUM CHLORIDE 0.9 % IV SOLN
418.0000 mg | Freq: Once | INTRAVENOUS | Status: AC
  Administered 2017-01-11: 420 mg via INTRAVENOUS
  Filled 2017-01-11: qty 42

## 2017-01-11 MED ORDER — FAMOTIDINE IN NACL 20-0.9 MG/50ML-% IV SOLN
INTRAVENOUS | Status: AC
  Filled 2017-01-11: qty 50

## 2017-01-11 MED ORDER — FAMOTIDINE IN NACL 20-0.9 MG/50ML-% IV SOLN
20.0000 mg | Freq: Once | INTRAVENOUS | Status: AC
  Administered 2017-01-11: 20 mg via INTRAVENOUS

## 2017-01-11 MED ORDER — PALONOSETRON HCL INJECTION 0.25 MG/5ML
INTRAVENOUS | Status: AC
  Filled 2017-01-11: qty 5

## 2017-01-11 MED ORDER — DIPHENHYDRAMINE HCL 50 MG/ML IJ SOLN
25.0000 mg | Freq: Once | INTRAMUSCULAR | Status: AC
  Administered 2017-01-11: 25 mg via INTRAVENOUS

## 2017-01-11 MED ORDER — PALONOSETRON HCL INJECTION 0.25 MG/5ML
0.2500 mg | Freq: Once | INTRAVENOUS | Status: AC
  Administered 2017-01-11: 0.25 mg via INTRAVENOUS

## 2017-01-11 MED ORDER — SODIUM CHLORIDE 0.9 % IV SOLN
15.0000 mg/kg | Freq: Once | INTRAVENOUS | Status: AC
  Administered 2017-01-11: 1000 mg via INTRAVENOUS
  Filled 2017-01-11: qty 32

## 2017-01-11 NOTE — Patient Instructions (Signed)
[facility] Discharge Instructions for Patients Receiving Chemotherapy  Today you received the following chemotherapy agents: Avastin, Alimta and Carboplatin   To help prevent nausea and vomiting after your treatment, we encourage you to take your nausea medication as directed.    If you develop nausea and vomiting that is not controlled by your nausea medication, call the clinic.   BELOW ARE SYMPTOMS THAT SHOULD BE REPORTED IMMEDIATELY:  *FEVER GREATER THAN 100.5 F  *CHILLS WITH OR WITHOUT FEVER  NAUSEA AND VOMITING THAT IS NOT CONTROLLED WITH YOUR NAUSEA MEDICATION  *UNUSUAL SHORTNESS OF BREATH  *UNUSUAL BRUISING OR BLEEDING  TENDERNESS IN MOUTH AND THROAT WITH OR WITHOUT PRESENCE OF ULCERS  *URINARY PROBLEMS  *BOWEL PROBLEMS  UNUSUAL RASH Items with * indicate a potential emergency and should be followed up as soon as possible.  Feel free to call the clinic you have any questions or concerns. The clinic phone number is [phone].  Please show the CHEMO ALERT CARD at check-in to the Emergency Department and triage nurse.   

## 2017-01-11 NOTE — Progress Notes (Signed)
Pt tolerated treatment well. Pt stable at discharge.  

## 2017-01-11 NOTE — Progress Notes (Signed)
Dr. Julien Nordmann called to relay info that patient has a sensitivity to Decadron and anticipates having a reaction like the one during previous Avastin administration. Dr. Julien Nordmann approved Benadryl and Pepcid as pre-meds. Pharmacy notified.

## 2017-01-12 ENCOUNTER — Ambulatory Visit
Admission: RE | Admit: 2017-01-12 | Discharge: 2017-01-12 | Disposition: A | Discharge status: Home / Self Care | Payer: BLUE CROSS/BLUE SHIELD | Source: Ambulatory Visit | Attending: Radiation Oncology | Admitting: Radiation Oncology

## 2017-01-12 ENCOUNTER — Telehealth: Payer: Self-pay

## 2017-01-12 ENCOUNTER — Telehealth: Payer: Self-pay | Admitting: Radiation Oncology

## 2017-01-12 ENCOUNTER — Ambulatory Visit
Admission: RE | Admit: 2017-01-12 | Discharge: 2017-01-12 | Disposition: A | Discharge status: Home / Self Care | Payer: Self-pay | Source: Ambulatory Visit | Attending: Radiation Oncology | Admitting: Radiation Oncology

## 2017-01-12 ENCOUNTER — Other Ambulatory Visit: Payer: Self-pay

## 2017-01-12 VITALS — BP 133/60 | HR 76 | Temp 98.3°F | Resp 18 | Wt 149.4 lb

## 2017-01-12 DIAGNOSIS — E041 Nontoxic single thyroid nodule: Secondary | ICD-10-CM | POA: Diagnosis not present

## 2017-01-12 DIAGNOSIS — I10 Essential (primary) hypertension: Secondary | ICD-10-CM | POA: Diagnosis not present

## 2017-01-12 DIAGNOSIS — Z8042 Family history of malignant neoplasm of prostate: Secondary | ICD-10-CM | POA: Diagnosis not present

## 2017-01-12 DIAGNOSIS — C787 Secondary malignant neoplasm of liver and intrahepatic bile duct: Secondary | ICD-10-CM | POA: Diagnosis not present

## 2017-01-12 DIAGNOSIS — Z90710 Acquired absence of both cervix and uterus: Secondary | ICD-10-CM | POA: Diagnosis not present

## 2017-01-12 DIAGNOSIS — K209 Esophagitis, unspecified without bleeding: Secondary | ICD-10-CM

## 2017-01-12 DIAGNOSIS — J329 Chronic sinusitis, unspecified: Secondary | ICD-10-CM | POA: Diagnosis not present

## 2017-01-12 DIAGNOSIS — K219 Gastro-esophageal reflux disease without esophagitis: Secondary | ICD-10-CM | POA: Diagnosis not present

## 2017-01-12 DIAGNOSIS — C3492 Malignant neoplasm of unspecified part of left bronchus or lung: Secondary | ICD-10-CM

## 2017-01-12 DIAGNOSIS — C7970 Secondary malignant neoplasm of unspecified adrenal gland: Secondary | ICD-10-CM | POA: Diagnosis not present

## 2017-01-12 DIAGNOSIS — C7802 Secondary malignant neoplasm of left lung: Secondary | ICD-10-CM | POA: Diagnosis not present

## 2017-01-12 DIAGNOSIS — J45909 Unspecified asthma, uncomplicated: Secondary | ICD-10-CM | POA: Diagnosis not present

## 2017-01-12 DIAGNOSIS — Z803 Family history of malignant neoplasm of breast: Secondary | ICD-10-CM | POA: Diagnosis not present

## 2017-01-12 DIAGNOSIS — C7931 Secondary malignant neoplasm of brain: Secondary | ICD-10-CM | POA: Diagnosis not present

## 2017-01-12 DIAGNOSIS — Z9889 Other specified postprocedural states: Secondary | ICD-10-CM | POA: Diagnosis not present

## 2017-01-12 DIAGNOSIS — Z510 Encounter for antineoplastic radiation therapy: Secondary | ICD-10-CM | POA: Diagnosis not present

## 2017-01-12 DIAGNOSIS — Z888 Allergy status to other drugs, medicaments and biological substances status: Secondary | ICD-10-CM | POA: Diagnosis not present

## 2017-01-12 DIAGNOSIS — C7951 Secondary malignant neoplasm of bone: Secondary | ICD-10-CM | POA: Diagnosis not present

## 2017-01-12 MED ORDER — SUCRALFATE 1 G PO TABS: 1.0000 g | ORAL_TABLET | Freq: Three times a day (TID) | ORAL | 5 refills | Status: DC

## 2017-01-12 NOTE — Telephone Encounter (Signed)
[facility] called stating they filled the sucralfate tablet rx. The pt stated it was wrong it should be a dissolvable wafer. Please clarify to pharmacy and/or pt the correct rx.  [facility] [phone]

## 2017-01-12 NOTE — Progress Notes (Signed)
Weight and vitals stable. Denies pain. No hyperpigmentation or desquamation within treatment field noted. Reports using radiaplex bid as directed. Increased hoarseness. New onset pain and difficulty associated with swallowing. Reports occasional dry cough that is unchanged in frequency. Reports SOB with exertion is unchanged. Reports fatigue.   BP 133/60   Pulse 76   Temp 98.3 F (36.8 C) (Oral)   Resp 18   Wt 149 lb 6.4 oz (67.8 kg)   SpO2 100%   BMI 25.64 kg/m  Wt Readings from Last 3 Encounters:  01/12/17 149 lb 6.4 oz (67.8 kg)  01/09/17 146 lb 14.4 oz (66.6 kg)  01/06/17 146 lb 12.8 oz (66.6 kg)

## 2017-01-12 NOTE — Telephone Encounter (Signed)
Returned [employer] call from [employer]. Casimer Bilis questions if Dr. Tammi Klippel intended to order Carafate tablet because the patient was expecting "a wafer." Assured her the order is correct. She verbalized understanding and plans to call the patient with clarification.

## 2017-01-12 NOTE — Progress Notes (Signed)
  Radiation Oncology         [phone]   Name: Karen Vasquez MRN: [id number]   Date: 01/12/2017  DOB: Sep 09, 1951   Weekly Radiation Therapy Management    ICD-9-CM ICD-10-CM   1. Adenocarcinoma of left lung, stage 4 (HCC) 162.9 C34.92     Current Dose: 17.5 Gy  Planned Dose:  35 Gy  Narrative The patient presents for routine under treatment assessment.  Weight and vitals stable. Denies pain. Per nursing, no hyperpigmentation or desquamation noted within the treatment field. Patient reports using Radiaplex bid as directed. She notes increased hoarseness and dysphagia. She reports an unchanged occasional dry cough and SOB with exertion. Reports fatigue.  Set-up films were reviewed. The chart was checked.  Physical Findings  weight is 149 lb 6.4 oz (67.8 kg). Her oral temperature is 98.3 F (36.8 C). Her blood pressure is 133/60 and her pulse is 76. Her respiration is 18 and oxygen saturation is 100%. . Weight essentially stable.  No significant changes.  Impression The patient is tolerating radiation.  Plan Continue treatment as planned.Prescribed Carafate for dysphagia.          Sheral Apley Tammi Klippel, M.D.  This document serves as a record of services personally performed by Tyler Pita, MD. It was created on his behalf by Bethann Humble, a trained medical scribe. The creation of this record is based on the scribe's personal observations and the provider's statements to them. This document has been checked and approved by the attending provider.

## 2017-01-13 ENCOUNTER — Encounter: Payer: Self-pay | Admitting: Radiation Oncology

## 2017-01-13 ENCOUNTER — Ambulatory Visit
Admission: RE | Admit: 2017-01-13 | Discharge: 2017-01-13 | Disposition: A | Discharge status: Home / Self Care | Payer: BLUE CROSS/BLUE SHIELD | Source: Ambulatory Visit | Attending: Radiation Oncology | Admitting: Radiation Oncology

## 2017-01-13 DIAGNOSIS — C7802 Secondary malignant neoplasm of left lung: Secondary | ICD-10-CM | POA: Diagnosis not present

## 2017-01-13 DIAGNOSIS — Z90710 Acquired absence of both cervix and uterus: Secondary | ICD-10-CM | POA: Diagnosis not present

## 2017-01-13 DIAGNOSIS — Z803 Family history of malignant neoplasm of breast: Secondary | ICD-10-CM | POA: Diagnosis not present

## 2017-01-13 DIAGNOSIS — J329 Chronic sinusitis, unspecified: Secondary | ICD-10-CM | POA: Diagnosis not present

## 2017-01-13 DIAGNOSIS — C7931 Secondary malignant neoplasm of brain: Secondary | ICD-10-CM | POA: Diagnosis not present

## 2017-01-13 DIAGNOSIS — E041 Nontoxic single thyroid nodule: Secondary | ICD-10-CM | POA: Diagnosis not present

## 2017-01-13 DIAGNOSIS — C7951 Secondary malignant neoplasm of bone: Secondary | ICD-10-CM | POA: Diagnosis not present

## 2017-01-13 DIAGNOSIS — Z8042 Family history of malignant neoplasm of prostate: Secondary | ICD-10-CM | POA: Diagnosis not present

## 2017-01-13 DIAGNOSIS — Z9889 Other specified postprocedural states: Secondary | ICD-10-CM | POA: Diagnosis not present

## 2017-01-13 DIAGNOSIS — C7970 Secondary malignant neoplasm of unspecified adrenal gland: Secondary | ICD-10-CM | POA: Diagnosis not present

## 2017-01-13 DIAGNOSIS — J45909 Unspecified asthma, uncomplicated: Secondary | ICD-10-CM | POA: Diagnosis not present

## 2017-01-13 DIAGNOSIS — I10 Essential (primary) hypertension: Secondary | ICD-10-CM | POA: Diagnosis not present

## 2017-01-13 DIAGNOSIS — C787 Secondary malignant neoplasm of liver and intrahepatic bile duct: Secondary | ICD-10-CM | POA: Diagnosis not present

## 2017-01-13 DIAGNOSIS — K219 Gastro-esophageal reflux disease without esophagitis: Secondary | ICD-10-CM | POA: Diagnosis not present

## 2017-01-13 DIAGNOSIS — Z888 Allergy status to other drugs, medicaments and biological substances status: Secondary | ICD-10-CM | POA: Diagnosis not present

## 2017-01-13 DIAGNOSIS — Z510 Encounter for antineoplastic radiation therapy: Secondary | ICD-10-CM | POA: Diagnosis not present

## 2017-01-13 NOTE — Progress Notes (Signed)
Received voicemail message from [place] at [employer] that the patient's script for oxycodone now needs a prior authorization for quantity. Phoned [id number]. Began authorization and gained approval. [phone] is good thru 01/13/18. Immediately phoned Casimer Bilis at [employer] and informed her of the authorization number.She verbalized understanding.

## 2017-01-15 ENCOUNTER — Other Ambulatory Visit: Payer: Self-pay | Admitting: Internal Medicine

## 2017-01-16 ENCOUNTER — Ambulatory Visit
Admission: RE | Admit: 2017-01-16 | Discharge: 2017-01-16 | Disposition: A | Discharge status: Home / Self Care | Payer: BLUE CROSS/BLUE SHIELD | Source: Ambulatory Visit | Attending: Radiation Oncology | Admitting: Radiation Oncology

## 2017-01-16 ENCOUNTER — Other Ambulatory Visit: Payer: Self-pay | Admitting: *Deleted

## 2017-01-16 DIAGNOSIS — Z888 Allergy status to other drugs, medicaments and biological substances status: Secondary | ICD-10-CM | POA: Diagnosis not present

## 2017-01-16 DIAGNOSIS — K219 Gastro-esophageal reflux disease without esophagitis: Secondary | ICD-10-CM | POA: Diagnosis not present

## 2017-01-16 DIAGNOSIS — C7802 Secondary malignant neoplasm of left lung: Secondary | ICD-10-CM | POA: Diagnosis not present

## 2017-01-16 DIAGNOSIS — C7970 Secondary malignant neoplasm of unspecified adrenal gland: Secondary | ICD-10-CM | POA: Diagnosis not present

## 2017-01-16 DIAGNOSIS — C787 Secondary malignant neoplasm of liver and intrahepatic bile duct: Secondary | ICD-10-CM | POA: Diagnosis not present

## 2017-01-16 DIAGNOSIS — Z90710 Acquired absence of both cervix and uterus: Secondary | ICD-10-CM | POA: Diagnosis not present

## 2017-01-16 DIAGNOSIS — Z8042 Family history of malignant neoplasm of prostate: Secondary | ICD-10-CM | POA: Diagnosis not present

## 2017-01-16 DIAGNOSIS — J329 Chronic sinusitis, unspecified: Secondary | ICD-10-CM | POA: Diagnosis not present

## 2017-01-16 DIAGNOSIS — Z803 Family history of malignant neoplasm of breast: Secondary | ICD-10-CM | POA: Diagnosis not present

## 2017-01-16 DIAGNOSIS — E041 Nontoxic single thyroid nodule: Secondary | ICD-10-CM | POA: Diagnosis not present

## 2017-01-16 DIAGNOSIS — Z9889 Other specified postprocedural states: Secondary | ICD-10-CM | POA: Diagnosis not present

## 2017-01-16 DIAGNOSIS — C7931 Secondary malignant neoplasm of brain: Secondary | ICD-10-CM | POA: Diagnosis not present

## 2017-01-16 DIAGNOSIS — J45909 Unspecified asthma, uncomplicated: Secondary | ICD-10-CM | POA: Diagnosis not present

## 2017-01-16 DIAGNOSIS — I10 Essential (primary) hypertension: Secondary | ICD-10-CM | POA: Diagnosis not present

## 2017-01-16 DIAGNOSIS — C7951 Secondary malignant neoplasm of bone: Secondary | ICD-10-CM | POA: Diagnosis not present

## 2017-01-16 DIAGNOSIS — Z510 Encounter for antineoplastic radiation therapy: Secondary | ICD-10-CM | POA: Diagnosis not present

## 2017-01-16 MED ORDER — ONDANSETRON HCL 8 MG PO TABS: 8.0000 mg | ORAL_TABLET | Freq: Three times a day (TID) | ORAL | 0 refills | Status: AC | PRN

## 2017-01-17 ENCOUNTER — Ambulatory Visit
Admission: RE | Admit: 2017-01-17 | Discharge: 2017-01-17 | Disposition: A | Discharge status: Home / Self Care | Payer: BLUE CROSS/BLUE SHIELD | Source: Ambulatory Visit | Attending: Radiation Oncology | Admitting: Radiation Oncology

## 2017-01-17 ENCOUNTER — Telehealth: Payer: Self-pay | Admitting: *Deleted

## 2017-01-17 ENCOUNTER — Ambulatory Visit: Payer: BLUE CROSS/BLUE SHIELD | Admitting: *Deleted

## 2017-01-17 VITALS — BP 135/84 | HR 96 | Temp 97.6°F | Resp 18 | Wt 138.4 lb

## 2017-01-17 DIAGNOSIS — C7931 Secondary malignant neoplasm of brain: Secondary | ICD-10-CM | POA: Diagnosis not present

## 2017-01-17 DIAGNOSIS — Z888 Allergy status to other drugs, medicaments and biological substances status: Secondary | ICD-10-CM | POA: Diagnosis not present

## 2017-01-17 DIAGNOSIS — C7951 Secondary malignant neoplasm of bone: Secondary | ICD-10-CM

## 2017-01-17 DIAGNOSIS — Z510 Encounter for antineoplastic radiation therapy: Secondary | ICD-10-CM | POA: Diagnosis not present

## 2017-01-17 DIAGNOSIS — Z803 Family history of malignant neoplasm of breast: Secondary | ICD-10-CM | POA: Diagnosis not present

## 2017-01-17 DIAGNOSIS — C3492 Malignant neoplasm of unspecified part of left bronchus or lung: Secondary | ICD-10-CM

## 2017-01-17 DIAGNOSIS — C7802 Secondary malignant neoplasm of left lung: Secondary | ICD-10-CM | POA: Diagnosis not present

## 2017-01-17 DIAGNOSIS — C787 Secondary malignant neoplasm of liver and intrahepatic bile duct: Secondary | ICD-10-CM | POA: Diagnosis not present

## 2017-01-17 DIAGNOSIS — C7970 Secondary malignant neoplasm of unspecified adrenal gland: Secondary | ICD-10-CM | POA: Diagnosis not present

## 2017-01-17 DIAGNOSIS — J329 Chronic sinusitis, unspecified: Secondary | ICD-10-CM | POA: Diagnosis not present

## 2017-01-17 DIAGNOSIS — Z8042 Family history of malignant neoplasm of prostate: Secondary | ICD-10-CM | POA: Diagnosis not present

## 2017-01-17 DIAGNOSIS — J45909 Unspecified asthma, uncomplicated: Secondary | ICD-10-CM | POA: Diagnosis not present

## 2017-01-17 DIAGNOSIS — K219 Gastro-esophageal reflux disease without esophagitis: Secondary | ICD-10-CM | POA: Diagnosis not present

## 2017-01-17 DIAGNOSIS — E041 Nontoxic single thyroid nodule: Secondary | ICD-10-CM | POA: Diagnosis not present

## 2017-01-17 DIAGNOSIS — I10 Essential (primary) hypertension: Secondary | ICD-10-CM | POA: Diagnosis not present

## 2017-01-17 DIAGNOSIS — Z90710 Acquired absence of both cervix and uterus: Secondary | ICD-10-CM | POA: Diagnosis not present

## 2017-01-17 DIAGNOSIS — Z9889 Other specified postprocedural states: Secondary | ICD-10-CM | POA: Diagnosis not present

## 2017-01-17 MED ORDER — OLANZAPINE 10 MG PO TABS: 10.0000 mg | ORAL_TABLET | Freq: Every day | ORAL | 1 refills | Status: DC

## 2017-01-17 NOTE — Telephone Encounter (Signed)
Patient calling from lobby now awaiting RT.  "I'm nauseated.  A medicine was called in yesterday and it's not working.  I may have drank 4 oz yesterday, ate one peanut butter cracker and jello.  I need help.  Today I've drank maybe 3 oz Gatorade.  Bladder working well.   Last BM was two days ago.  Took senokot a few days ago.  I may be dehydrated.  I have not vomited.  01-11-2017 was last chemotherapy but I was nauseated before I received chemotherapy."  Advised she wait after Radiation appointment.  This nurse will notify Dr. Julien Nordmann.

## 2017-01-17 NOTE — Telephone Encounter (Signed)
Verbal order received and read back from Dr. Julien Nordmann for Zyprexa 10 mg ar bedtime.  Order given to Pharmacy [facility] at this time.  Called patient.  Message left.

## 2017-01-17 NOTE — Progress Notes (Signed)
HPI:  Patient with Stage IVB (T1b, N2, M1c) NSCLC presents to the clinic today following scheduled radiotherapy treatment requesting to be evaluated by a provider due to persistent nausea, poor appetite and weight loss despite the use of Zofran and Compazine. She reports being unable to eat or drink anything in the last several days due to nausea. She reports increased fatigue. She denies fever, chills, vomiting, diarrhea, or constipation. She denies any pain associated with swallowing but reports chronic occasional dry cough. She denies any new change to her baseline shortness of breath with exertion and denies any chest pain or dyspnea. She reports an approximate 11 pounds weight loss over the past week.  PE: Vitals:   01/17/17 0938 01/17/17 0939  BP: (!) 144/84 135/84  Pulse: 89 96  Resp: 18   Temp: 97.6 F (36.4 C)    In general this is a chronically ill appearing caucasian female in no acute distress.She's alert and oriented x4 and appropriate throughout the examination. Cardiopulmonary assessment is negative for acute distress and she exhibits normal effort.   Assessment/Plan:  1. Stage IVB (T1b, N2, M1c) NSCLC. She will continue treatments as planned. I have encouraged her to call her contact us regarding the development of new or worsening symptoms. She states her understanding and compliance. 2. Nausea/Anorexia.  She reports that Dr. Julien Nordmann is sending her a new prescription for Zyprexa to help with the nausea and appetite stimulation. Encouraged to use this in addition to the Compazine and Zofran. She is advised to use Zofran and Compazine around-the-clock to try and avoid significant breakthrough nausea. We have discussed the importance of hydration and she is encouraged to maintain appropriate hydration throughout the day as discussed.   Nicholos Johns, PA-C  9

## 2017-01-17 NOTE — Addendum Note (Signed)
Encounter addended by: Freeman Caldron, PA-C on: 01/17/2017 10:29 AM<BR>    Actions taken: LOS modified, Follow-up modified

## 2017-01-17 NOTE — Telephone Encounter (Signed)
Reached patient.  Medication instructions given and answered questions.  Yes use the other nausea medicines during the day for nausea.  Advised she try to drink every hour even if it's just an ounce.  Has senokot-S on hand suggested use at bedtime for bowel regularity in addition to drinking hot tea or warm prune juice to stimulate bowels.  No further questions.

## 2017-01-17 NOTE — Progress Notes (Signed)
Patient presented to the clinic requesting to be evaluated by a provider. Patient reports she hasn't eat or drank anything much in the last several days due to nausea. Denies emesis. Reports taking zofran without relief. Eleven pound weight loss noted. Reports a poor appetite. Encouraged patient to begin taking zofran and compazine around the clock. Also, advised patient to take Miralax daily to prevent constipation associated with both. Patient verbalized understanding. Patient denies pain. Hoarseness continues. Denies skin changes within treatment field. Denies pain associated with swallowing. Reports an occasional dry cough. Reports SOB with exertion is unchanged. Reports fatigue.   BP 135/84 (BP Location: Left Arm, Patient Position: Sitting, Cuff Size: Normal)   Pulse 96   Temp 97.6 F (36.4 C) (Oral)   Resp 18   Wt 138 lb 6.4 oz (62.8 kg)   SpO2 100%   BMI 23.76 kg/m  Wt Readings from Last 3 Encounters:  01/17/17 138 lb 6.4 oz (62.8 kg)  01/12/17 149 lb 6.4 oz (67.8 kg)  01/09/17 146 lb 14.4 oz (66.6 kg)

## 2017-01-18 ENCOUNTER — Ambulatory Visit
Admission: RE | Admit: 2017-01-18 | Discharge: 2017-01-18 | Disposition: A | Discharge status: Home / Self Care | Payer: BLUE CROSS/BLUE SHIELD | Source: Ambulatory Visit | Attending: Radiation Oncology | Admitting: Radiation Oncology

## 2017-01-18 DIAGNOSIS — C7931 Secondary malignant neoplasm of brain: Secondary | ICD-10-CM | POA: Diagnosis not present

## 2017-01-18 DIAGNOSIS — J45909 Unspecified asthma, uncomplicated: Secondary | ICD-10-CM | POA: Diagnosis not present

## 2017-01-18 DIAGNOSIS — Z90710 Acquired absence of both cervix and uterus: Secondary | ICD-10-CM | POA: Diagnosis not present

## 2017-01-18 DIAGNOSIS — K219 Gastro-esophageal reflux disease without esophagitis: Secondary | ICD-10-CM | POA: Diagnosis not present

## 2017-01-18 DIAGNOSIS — Z8042 Family history of malignant neoplasm of prostate: Secondary | ICD-10-CM | POA: Diagnosis not present

## 2017-01-18 DIAGNOSIS — C787 Secondary malignant neoplasm of liver and intrahepatic bile duct: Secondary | ICD-10-CM | POA: Diagnosis not present

## 2017-01-18 DIAGNOSIS — C7951 Secondary malignant neoplasm of bone: Secondary | ICD-10-CM | POA: Diagnosis not present

## 2017-01-18 DIAGNOSIS — Z803 Family history of malignant neoplasm of breast: Secondary | ICD-10-CM | POA: Diagnosis not present

## 2017-01-18 DIAGNOSIS — Z9889 Other specified postprocedural states: Secondary | ICD-10-CM | POA: Diagnosis not present

## 2017-01-18 DIAGNOSIS — C7802 Secondary malignant neoplasm of left lung: Secondary | ICD-10-CM | POA: Diagnosis not present

## 2017-01-18 DIAGNOSIS — Z510 Encounter for antineoplastic radiation therapy: Secondary | ICD-10-CM | POA: Diagnosis not present

## 2017-01-18 DIAGNOSIS — E041 Nontoxic single thyroid nodule: Secondary | ICD-10-CM | POA: Diagnosis not present

## 2017-01-18 DIAGNOSIS — J329 Chronic sinusitis, unspecified: Secondary | ICD-10-CM | POA: Diagnosis not present

## 2017-01-18 DIAGNOSIS — I10 Essential (primary) hypertension: Secondary | ICD-10-CM | POA: Diagnosis not present

## 2017-01-18 DIAGNOSIS — Z888 Allergy status to other drugs, medicaments and biological substances status: Secondary | ICD-10-CM | POA: Diagnosis not present

## 2017-01-18 DIAGNOSIS — C7970 Secondary malignant neoplasm of unspecified adrenal gland: Secondary | ICD-10-CM | POA: Diagnosis not present

## 2017-01-19 ENCOUNTER — Ambulatory Visit: Payer: BLUE CROSS/BLUE SHIELD | Admitting: Speech Pathology

## 2017-01-19 ENCOUNTER — Ambulatory Visit
Admission: RE | Admit: 2017-01-19 | Discharge: 2017-01-19 | Disposition: A | Discharge status: Home / Self Care | Payer: BLUE CROSS/BLUE SHIELD | Source: Ambulatory Visit | Attending: Radiation Oncology | Admitting: Radiation Oncology

## 2017-01-19 ENCOUNTER — Other Ambulatory Visit (HOSPITAL_BASED_OUTPATIENT_CLINIC_OR_DEPARTMENT_OTHER): Payer: BLUE CROSS/BLUE SHIELD

## 2017-01-19 DIAGNOSIS — C3492 Malignant neoplasm of unspecified part of left bronchus or lung: Secondary | ICD-10-CM

## 2017-01-19 DIAGNOSIS — C787 Secondary malignant neoplasm of liver and intrahepatic bile duct: Secondary | ICD-10-CM | POA: Diagnosis not present

## 2017-01-19 DIAGNOSIS — Z90710 Acquired absence of both cervix and uterus: Secondary | ICD-10-CM | POA: Diagnosis not present

## 2017-01-19 DIAGNOSIS — E041 Nontoxic single thyroid nodule: Secondary | ICD-10-CM | POA: Diagnosis not present

## 2017-01-19 DIAGNOSIS — Z510 Encounter for antineoplastic radiation therapy: Secondary | ICD-10-CM | POA: Diagnosis not present

## 2017-01-19 DIAGNOSIS — J329 Chronic sinusitis, unspecified: Secondary | ICD-10-CM | POA: Diagnosis not present

## 2017-01-19 DIAGNOSIS — C7951 Secondary malignant neoplasm of bone: Secondary | ICD-10-CM | POA: Diagnosis not present

## 2017-01-19 DIAGNOSIS — Z8042 Family history of malignant neoplasm of prostate: Secondary | ICD-10-CM | POA: Diagnosis not present

## 2017-01-19 DIAGNOSIS — Z803 Family history of malignant neoplasm of breast: Secondary | ICD-10-CM | POA: Diagnosis not present

## 2017-01-19 DIAGNOSIS — J45909 Unspecified asthma, uncomplicated: Secondary | ICD-10-CM | POA: Diagnosis not present

## 2017-01-19 DIAGNOSIS — Z9889 Other specified postprocedural states: Secondary | ICD-10-CM | POA: Diagnosis not present

## 2017-01-19 DIAGNOSIS — C7802 Secondary malignant neoplasm of left lung: Secondary | ICD-10-CM | POA: Diagnosis not present

## 2017-01-19 DIAGNOSIS — I10 Essential (primary) hypertension: Secondary | ICD-10-CM | POA: Diagnosis not present

## 2017-01-19 DIAGNOSIS — C7931 Secondary malignant neoplasm of brain: Secondary | ICD-10-CM | POA: Diagnosis not present

## 2017-01-19 DIAGNOSIS — K219 Gastro-esophageal reflux disease without esophagitis: Secondary | ICD-10-CM | POA: Diagnosis not present

## 2017-01-19 DIAGNOSIS — C7970 Secondary malignant neoplasm of unspecified adrenal gland: Secondary | ICD-10-CM | POA: Diagnosis not present

## 2017-01-19 DIAGNOSIS — Z888 Allergy status to other drugs, medicaments and biological substances status: Secondary | ICD-10-CM | POA: Diagnosis not present

## 2017-01-19 LAB — CBC WITH DIFFERENTIAL/PLATELET
BASO%: 0.3 % (ref 0.0–2.0)
Basophils Absolute: 0 10*3/uL (ref 0.0–0.1)
EOS%: 2 % (ref 0.0–7.0)
Eosinophils Absolute: 0.1 10*3/uL (ref 0.0–0.5)
HEMATOCRIT: 39.2 % (ref 34.8–46.6)
HGB: 12.9 g/dL (ref 11.6–15.9)
LYMPH#: 0.8 10*3/uL — AB (ref 0.9–3.3)
LYMPH%: 21.5 % (ref 14.0–49.7)
MCH: 28.4 pg (ref 25.1–34.0)
MCHC: 32.9 g/dL (ref 31.5–36.0)
MCV: 86.3 fL (ref 79.5–101.0)
MONO#: 0.5 10*3/uL (ref 0.1–0.9)
MONO%: 12.5 % (ref 0.0–14.0)
NEUT#: 2.5 10*3/uL (ref 1.5–6.5)
NEUT%: 63.7 % (ref 38.4–76.8)
Platelets: 235 10*3/uL (ref 145–400)
RBC: 4.54 10*6/uL (ref 3.70–5.45)
RDW: 15.3 % — ABNORMAL HIGH (ref 11.2–14.5)
WBC: 3.9 10*3/uL (ref 3.9–10.3)

## 2017-01-19 LAB — COMPREHENSIVE METABOLIC PANEL
ALBUMIN: 3.8 g/dL (ref 3.5–5.0)
ALK PHOS: 171 U/L — AB (ref 40–150)
ALT: 21 U/L (ref 0–55)
AST: 23 U/L (ref 5–34)
Anion Gap: 11 mEq/L (ref 3–11)
BUN: 24.5 mg/dL (ref 7.0–26.0)
CHLORIDE: 103 meq/L (ref 98–109)
CO2: 23 meq/L (ref 22–29)
Calcium: 9.6 mg/dL (ref 8.4–10.4)
Creatinine: 1.3 mg/dL — ABNORMAL HIGH (ref 0.6–1.1)
EGFR: 42 mL/min/{1.73_m2} — ABNORMAL LOW (ref >90)
GLUCOSE: 122 mg/dL (ref 70–140)
POTASSIUM: 3.5 meq/L (ref 3.5–5.1)
SODIUM: 138 meq/L (ref 136–145)
Total Bilirubin: 0.39 mg/dL (ref 0.20–1.20)
Total Protein: 8 g/dL (ref 6.4–8.3)

## 2017-01-20 ENCOUNTER — Ambulatory Visit
Admission: RE | Admit: 2017-01-20 | Discharge: 2017-01-20 | Disposition: A | Discharge status: Home / Self Care | Payer: BLUE CROSS/BLUE SHIELD | Source: Ambulatory Visit | Attending: Radiation Oncology | Admitting: Radiation Oncology

## 2017-01-20 DIAGNOSIS — J329 Chronic sinusitis, unspecified: Secondary | ICD-10-CM | POA: Diagnosis not present

## 2017-01-20 DIAGNOSIS — K219 Gastro-esophageal reflux disease without esophagitis: Secondary | ICD-10-CM | POA: Diagnosis not present

## 2017-01-20 DIAGNOSIS — J45909 Unspecified asthma, uncomplicated: Secondary | ICD-10-CM | POA: Diagnosis not present

## 2017-01-20 DIAGNOSIS — Z8042 Family history of malignant neoplasm of prostate: Secondary | ICD-10-CM | POA: Diagnosis not present

## 2017-01-20 DIAGNOSIS — C7931 Secondary malignant neoplasm of brain: Secondary | ICD-10-CM | POA: Diagnosis not present

## 2017-01-20 DIAGNOSIS — Z510 Encounter for antineoplastic radiation therapy: Secondary | ICD-10-CM | POA: Diagnosis not present

## 2017-01-20 DIAGNOSIS — C7951 Secondary malignant neoplasm of bone: Secondary | ICD-10-CM | POA: Diagnosis not present

## 2017-01-20 DIAGNOSIS — I10 Essential (primary) hypertension: Secondary | ICD-10-CM | POA: Diagnosis not present

## 2017-01-20 DIAGNOSIS — Z9889 Other specified postprocedural states: Secondary | ICD-10-CM | POA: Diagnosis not present

## 2017-01-20 DIAGNOSIS — C7802 Secondary malignant neoplasm of left lung: Secondary | ICD-10-CM | POA: Diagnosis not present

## 2017-01-20 DIAGNOSIS — Z888 Allergy status to other drugs, medicaments and biological substances status: Secondary | ICD-10-CM | POA: Diagnosis not present

## 2017-01-20 DIAGNOSIS — E041 Nontoxic single thyroid nodule: Secondary | ICD-10-CM | POA: Diagnosis not present

## 2017-01-20 DIAGNOSIS — Z803 Family history of malignant neoplasm of breast: Secondary | ICD-10-CM | POA: Diagnosis not present

## 2017-01-20 DIAGNOSIS — C7970 Secondary malignant neoplasm of unspecified adrenal gland: Secondary | ICD-10-CM | POA: Diagnosis not present

## 2017-01-20 DIAGNOSIS — Z90710 Acquired absence of both cervix and uterus: Secondary | ICD-10-CM | POA: Diagnosis not present

## 2017-01-20 DIAGNOSIS — C787 Secondary malignant neoplasm of liver and intrahepatic bile duct: Secondary | ICD-10-CM | POA: Diagnosis not present

## 2017-01-23 ENCOUNTER — Ambulatory Visit
Admission: RE | Admit: 2017-01-23 | Discharge: 2017-01-23 | Disposition: A | Discharge status: Home / Self Care | Payer: BLUE CROSS/BLUE SHIELD | Source: Ambulatory Visit | Attending: Radiation Oncology | Admitting: Radiation Oncology

## 2017-01-23 DIAGNOSIS — Z90710 Acquired absence of both cervix and uterus: Secondary | ICD-10-CM | POA: Diagnosis not present

## 2017-01-23 DIAGNOSIS — I10 Essential (primary) hypertension: Secondary | ICD-10-CM | POA: Diagnosis not present

## 2017-01-23 DIAGNOSIS — Z803 Family history of malignant neoplasm of breast: Secondary | ICD-10-CM | POA: Diagnosis not present

## 2017-01-23 DIAGNOSIS — E041 Nontoxic single thyroid nodule: Secondary | ICD-10-CM | POA: Diagnosis not present

## 2017-01-23 DIAGNOSIS — C7931 Secondary malignant neoplasm of brain: Secondary | ICD-10-CM | POA: Diagnosis not present

## 2017-01-23 DIAGNOSIS — C7951 Secondary malignant neoplasm of bone: Secondary | ICD-10-CM | POA: Diagnosis not present

## 2017-01-23 DIAGNOSIS — C7970 Secondary malignant neoplasm of unspecified adrenal gland: Secondary | ICD-10-CM | POA: Diagnosis not present

## 2017-01-23 DIAGNOSIS — J45909 Unspecified asthma, uncomplicated: Secondary | ICD-10-CM | POA: Diagnosis not present

## 2017-01-23 DIAGNOSIS — Z9889 Other specified postprocedural states: Secondary | ICD-10-CM | POA: Diagnosis not present

## 2017-01-23 DIAGNOSIS — C7802 Secondary malignant neoplasm of left lung: Secondary | ICD-10-CM | POA: Diagnosis not present

## 2017-01-23 DIAGNOSIS — Z888 Allergy status to other drugs, medicaments and biological substances status: Secondary | ICD-10-CM | POA: Diagnosis not present

## 2017-01-23 DIAGNOSIS — J329 Chronic sinusitis, unspecified: Secondary | ICD-10-CM | POA: Diagnosis not present

## 2017-01-23 DIAGNOSIS — Z8042 Family history of malignant neoplasm of prostate: Secondary | ICD-10-CM | POA: Diagnosis not present

## 2017-01-23 DIAGNOSIS — K219 Gastro-esophageal reflux disease without esophagitis: Secondary | ICD-10-CM | POA: Diagnosis not present

## 2017-01-23 DIAGNOSIS — C787 Secondary malignant neoplasm of liver and intrahepatic bile duct: Secondary | ICD-10-CM | POA: Diagnosis not present

## 2017-01-23 DIAGNOSIS — Z510 Encounter for antineoplastic radiation therapy: Secondary | ICD-10-CM | POA: Diagnosis not present

## 2017-01-24 ENCOUNTER — Ambulatory Visit: Payer: BLUE CROSS/BLUE SHIELD | Attending: Radiation Oncology | Admitting: *Deleted

## 2017-01-24 DIAGNOSIS — R1313 Dysphagia, pharyngeal phase: Secondary | ICD-10-CM | POA: Diagnosis not present

## 2017-01-24 DIAGNOSIS — R498 Other voice and resonance disorders: Secondary | ICD-10-CM | POA: Insufficient documentation

## 2017-01-24 NOTE — Therapy (Signed)
[facility] [address] [place], [place], [address] Phone: [phone]   Fax:  [phone]  Speech Language Pathology Treatment  Patient Details  Name: Karen Vasquez MRN: [id number] Date of Birth: Jun 03, 1951 Referring Provider: Shona [place], PA-C  Encounter Date: 01/24/2017      End of Session - 01/24/17 1147    Visit Number 2   Number of Visits 17   Date for SLP Re-Evaluation 03/10/17   SLP Start Time 1601   SLP Stop Time  1100   SLP Time Calculation (min) 45 min   Activity Tolerance Patient tolerated treatment well      Past Medical History:  Diagnosis Date  . Adenocarcinoma of left lung, stage 4 ([facility]) 12/16/2016  . Arthritis   . Asthma due to seasonal allergies   . Encounter for antineoplastic chemotherapy 12/17/2016  . GERD (gastroesophageal reflux disease)   . Goals of care, counseling/discussion 12/17/2016  . Hernia of abdominal wall    umbilical  . Hypertension 01/09/2017  . No pertinent past medical history    COLD UPPER RESP STARTED ZPAK 6/14  . Recurrent upper respiratory infection (URI)    recent sinus inf  . Thyroid nodule   . Urine blood    hematuria    Past Surgical History:  Procedure Laterality Date  . ABDOMINAL HYSTERECTOMY  08   vag, rectocele repair , bladder sling  . ANTERIOR CERVICAL DECOMP/DISCECTOMY FUSION  05/09/2012   Procedure: ANTERIOR CERVICAL DECOMPRESSION/DISCECTOMY FUSION 1 LEVEL;  Surgeon: Eustace Moore, MD;  Location: [facility];  Service: Neurosurgery;  Laterality: N/A;  anterior cervical decompression fusion cervical five-six  . BREAST CYST EXCISION  96   fibroadenoma lft  . BREAST SURGERY     03  . CARPAL TUNNEL RELEASE  05   bil  . DILATION AND CURETTAGE OF UTERUS  03    polyps  . TUBAL LIGATION  88    There were no vitals filed for this visit.      Subjective Assessment - 01/24/17 1018    Subjective I didn't come last time because we didn't get out of  the cancer center in time.   Currently in Pain? Yes   Pain Score 3    Pain Location Back   Pain Orientation Lower   Pain Descriptors / Indicators Aching   Pain Type Chronic pain   Pain Onset More than a month ago   Pain Frequency Intermittent   Aggravating Factors  movement   Pain Relieving Factors meds               ADULT SLP TREATMENT - 01/24/17 0001      General Information   Behavior/Cognition Alert;Cooperative;Pleasant mood     Treatment Provided   Treatment provided Dysphagia;Cognitive-Linquistic     Dysphagia Treatment   Temperature Spikes Noted No   Respiratory Status Room air   Oral Cavity - Dentition Adequate natural dentition   Treatment Methods Compensation strategy training;Patient/caregiver education     Pain Assessment   Pain Assessment 0-10   Pain Score 3    Pain Location lower back   Pain Descriptors / Indicators Aching   Pain Intervention(s) Monitored during session     Cognitive-Linquistic Treatment   Treatment focused on Voice;Patient/family/caregiver education   Skilled Treatment Skilled ST session targeted review of OP evaluation and MBS results. SLP and pt practiced vocal fold adduction exercises, and encouraged to complete them daily. DYSPHAGIA: pt reports difficulty with mixed consistencies and  meds with liquid. She was encouraged to avoid these consistencies and take meds with puree. VOICE: Information regarding diaphragmatic breathing was provided and reviewed with pt, for proper breath support. MPT /a/ was 4.31 seconds, /s/ 16.5 seconds. Pt unable to produce /z/ sound. Pt given final /z/ words to practice proper production of this sound.      Assessment / Recommendations / Plan   Plan Continue with current plan of care     Dysphagia Recommendations   Diet recommendations Regular;Thin liquid;Other(comment)  avoid mixed consistencies, meds in puree   Liquids provided via Cup;Straw   Medication Administration Whole meds with puree    Compensations Slow rate;Small sips/bites   Postural Changes and/or Swallow Maneuvers Seated upright 90 degrees;Upright 30-60 min after meal     Progression Toward Goals   Progression toward goals Progressing toward goals          SLP Education - 01/24/17 1102    Education provided Yes   Education Details diaphragmatic breathing, vocal fold adduction, final /z/ word lists, safe swallow of meds, avoiding mixed consistencies   Person(s) Educated Patient   Methods Explanation;Demonstration;Verbal cues;Handout   Comprehension Verbalized understanding;Returned demonstration;Verbal cues required;Need further instruction          SLP Short Term Goals - 01/24/17 1233      SLP SHORT TERM GOAL #1   Title pt will demo vocal adduction (and dysphagia, PRN) HEP with modified independence over two sessions   Time 4   Period Weeks   Status On-going     SLP SHORT TERM GOAL #2   Title pt will achieve sustained /a/ of average 7.0 seconds   Time 4   Period Weeks   Status On-going     SLP SHORT TERM GOAL #3   Title pt will demo abdominal breathing in 5 minutes simple conversation   Time 4   Period Weeks   Status On-going          SLP Long Term Goals - 01/24/17 1234      SLP LONG TERM GOAL #1   Title pt will demo HEP (dysarthria, and dysphagia-PRN) with modified independence over three sessions   Period Weeks   Status On-going     SLP LONG TERM GOAL #2   Title pt will demo abdominal breathing in 10 minutes simple conversation over two sessions   Time 8   Period Weeks   Status On-going     SLP LONG TERM GOAL #3   Title pt will achieve 7.5 seconds average with sustained /a/    Time 8   Period Weeks   Status On-going     SLP LONG TERM GOAL #4   Title pt will achieve s/z ratio <3.4 over two sessions   Time 8   Period Weeks   Status On-going          Plan - 01/24/17 1147    Clinical Impression Statement Pt continues to exhibit high pitch, breathy voice quality. Pt  receptive to exercises for diaphragmatic breathing, vocal fold adduction, and safe swallow precautions. Pt would benefit from continued skilled ST services for improvement of vocal fold function, safe swallow, and effective communication.Marland Kitchen   Speech Therapy Frequency 2x / week   Duration --  8 weeks   Treatment/Interventions Aspiration precaution training;Pharyngeal strengthening exercises;Diet toleration management by SLP;Compensatory techniques;Internal/external aids;Multimodal communcation approach;SLP instruction and feedback;Patient/family education;Functional tasks   Potential to Achieve Goals Fair   Potential Considerations Severity of impairments;Co-morbidities;Ability to learn/carryover information;Family/community support;Cooperation/participation level  SLP Home Exercise Plan reviewed, provided additional   Consulted and Agree with Plan of Care Patient      Patient will benefit from skilled therapeutic intervention in order to improve the following deficits and impairments:   Other voice and resonance disorders (CODE)  Dysphagia, pharyngeal    Problem List Patient Active Problem List   Diagnosis Date Noted  . Hypertension 01/09/2017  . Brain metastasis ([facility]) 12/26/2016  . Bone metastasis ([facility]) 12/26/2016  . 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 Hypersensitivity reaction 12/22/2016  . Goals of care, counseling/discussion 12/17/2016  . Encounter for antineoplastic chemotherapy 12/17/2016  . Adenocarcinoma of left lung, stage 4 ([facility]) 12/16/2016  . Liver metastasis ([place] [facility]) 11/18/2016  . Unilateral vocal cord paralysis 11/10/2016   Russell Quinney B. [place]ova, MSP, CCC-SLP  Shonna Chock 01/24/2017, 12:35 PM  [facility] [address] [place], [place], [address] Phone: [phone]   Fax:  [phone]   Name: ANANDI ABRAMO MRN: [id number] Date of Birth: Mar 08, 1951

## 2017-01-25 ENCOUNTER — Encounter: Payer: Self-pay | Admitting: Radiation Oncology

## 2017-01-25 NOTE — Progress Notes (Signed)
  Radiation Oncology         [phone] ________________________________  Name: SHANAE LUO MRN: [id number]  Date: 01/25/2017  DOB: [birth year]  End of Treatment Note  Diagnosis:   Adenocarcinoma of the left lung, Stage IV ([facility]), with liver and brain metastases    Indication for treatment:  Curative       Radiation treatment dates:   01/04/17 - 01/23/17  Site/dose:   Chest treated to 35 Gy in 14 fractions. PTV 1-4 treated to 20 Gy in 1 fraction. Sacrum treated to 20 Gy in 5 fractions.   Beams/energy:   Chest: 3D  //  6X        PTV 1-4: SBRT/SRT-3D // 6FFF        Sacrum: Isodose Plan // 6X  Narrative: The patient tolerated radiation treatment relatively well.   During treatment, the pt experienced persistent nausea, lack of appetite, and weight loss though she did use Zofran and Compazine. Pt also endorsed fatigue and dry cough.  Plan: The patient has completed radiation treatment. She was advised to take Zyprexa in addition to Zofran and Compazine to treat nausea and stimulate appetite. The patient will return to radiation oncology clinic for routine followup in one month. I advised her to call or return sooner if she has any questions or concerns related to her recovery or treatment. ________________________________  Sheral Apley. Tammi Klippel, M.D.   This document serves as a record of services personally performed by Tyler Pita, MD. It was created on his behalf by Maryla Morrow, a trained medical scribe. The creation of this record is based on the scribe's personal observations and the provider's statements to them. This document has been checked and approved by the attending provider.

## 2017-01-26 ENCOUNTER — Other Ambulatory Visit (HOSPITAL_BASED_OUTPATIENT_CLINIC_OR_DEPARTMENT_OTHER): Payer: BLUE CROSS/BLUE SHIELD

## 2017-01-26 ENCOUNTER — Ambulatory Visit: Payer: BLUE CROSS/BLUE SHIELD

## 2017-01-26 DIAGNOSIS — C3492 Malignant neoplasm of unspecified part of left bronchus or lung: Secondary | ICD-10-CM

## 2017-01-26 DIAGNOSIS — R1313 Dysphagia, pharyngeal phase: Secondary | ICD-10-CM | POA: Diagnosis not present

## 2017-01-26 DIAGNOSIS — R498 Other voice and resonance disorders: Secondary | ICD-10-CM

## 2017-01-26 LAB — CBC WITH DIFFERENTIAL/PLATELET
BASO%: 0.1 % (ref 0.0–2.0)
BASOS ABS: 0 10*3/uL (ref 0.0–0.1)
EOS%: 3.1 % (ref 0.0–7.0)
Eosinophils Absolute: 0.1 10*3/uL (ref 0.0–0.5)
HCT: 34.3 % — ABNORMAL LOW (ref 34.8–46.6)
HGB: 11.4 g/dL — ABNORMAL LOW (ref 11.6–15.9)
LYMPH%: 9.4 % — ABNORMAL LOW (ref 14.0–49.7)
MCH: 29 pg (ref 25.1–34.0)
MCHC: 33.3 g/dL (ref 31.5–36.0)
MCV: 87.2 fL (ref 79.5–101.0)
MONO#: 0.8 10*3/uL (ref 0.1–0.9)
MONO%: 20 % — AB (ref 0.0–14.0)
NEUT#: 2.6 10*3/uL (ref 1.5–6.5)
NEUT%: 67.4 % (ref 38.4–76.8)
Platelets: 163 10*3/uL (ref 145–400)
RBC: 3.93 10*6/uL (ref 3.70–5.45)
RDW: 16.4 % — AB (ref 11.2–14.5)
WBC: 3.9 10*3/uL (ref 3.9–10.3)
lymph#: 0.4 10*3/uL — ABNORMAL LOW (ref 0.9–3.3)

## 2017-01-26 LAB — COMPREHENSIVE METABOLIC PANEL
ALT: 15 U/L (ref 0–55)
AST: 16 U/L (ref 5–34)
Albumin: 3.3 g/dL — ABNORMAL LOW (ref 3.5–5.0)
Alkaline Phosphatase: 134 U/L (ref 40–150)
Anion Gap: 11 mEq/L (ref 3–11)
BUN: 11.6 mg/dL (ref 7.0–26.0)
CHLORIDE: 106 meq/L (ref 98–109)
CO2: 23 meq/L (ref 22–29)
CREATININE: 0.8 mg/dL (ref 0.6–1.1)
Calcium: 9.4 mg/dL (ref 8.4–10.4)
EGFR: 73 mL/min/{1.73_m2} — ABNORMAL LOW (ref >90)
GLUCOSE: 100 mg/dL (ref 70–140)
POTASSIUM: 4 meq/L (ref 3.5–5.1)
SODIUM: 140 meq/L (ref 136–145)
Total Bilirubin: 0.36 mg/dL (ref 0.20–1.20)
Total Protein: 7 g/dL (ref 6.4–8.3)

## 2017-01-26 LAB — UA PROTEIN, DIPSTICK - CHCC

## 2017-01-26 NOTE — Patient Instructions (Signed)
Practice abdominal breathing 10-15 minutes twice a day. Practice answering the stimuli on the handouts with ABDOMINAL breathing.

## 2017-01-26 NOTE — Therapy (Signed)
[facility] [address] [place], [place], [address] Phone: [phone]   Fax:  [phone]  Speech Language Pathology Treatment  Patient Details  Name: Karen Vasquez MRN: [id number] Date of Birth: Apr 05, 1951 Referring Provider: Shona [place], PA-C  Encounter Date: 01/26/2017      End of Session - 01/26/17 1102    Visit Number 3   Number of Visits 17   Date for SLP Re-Evaluation 03/10/17   SLP Start Time 1016   SLP Stop Time  1100   SLP Time Calculation (min) 44 min      Past Medical History:  Diagnosis Date  . Adenocarcinoma of left lung, stage 4 ([facility]) 12/16/2016  . Arthritis   . Asthma due to seasonal allergies   . Encounter for antineoplastic chemotherapy 12/17/2016  . GERD (gastroesophageal reflux disease)   . Goals of care, counseling/discussion 12/17/2016  . Hernia of abdominal wall    umbilical  . Hypertension 01/09/2017  . No pertinent past medical history    COLD UPPER RESP STARTED ZPAK 6/14  . Recurrent upper respiratory infection (URI)    recent sinus inf  . Thyroid nodule   . Urine blood    hematuria    Past Surgical History:  Procedure Laterality Date  . ABDOMINAL HYSTERECTOMY  08   vag, rectocele repair , bladder sling  . ANTERIOR CERVICAL DECOMP/DISCECTOMY FUSION  05/09/2012   Procedure: ANTERIOR CERVICAL DECOMPRESSION/DISCECTOMY FUSION 1 LEVEL;  Surgeon: Eustace Moore, MD;  Location: [facility];  Service: Neurosurgery;  Laterality: N/A;  anterior cervical decompression fusion cervical five-six  . BREAST CYST EXCISION  96   fibroadenoma lft  . BREAST SURGERY     03  . CARPAL TUNNEL RELEASE  05   bil  . DILATION AND CURETTAGE OF UTERUS  03    polyps  . TUBAL LIGATION  88    There were no vitals filed for this visit.      Subjective Assessment - 01/26/17 1016    Subjective Pt arrives today with papers provided last session wiht this SLP. Pt reports final /z/ misarticulations are  bothersome.               ADULT SLP TREATMENT - 01/26/17 1021      General Information   Behavior/Cognition Alert;Cooperative;Pleasant mood     Treatment Provided   Treatment provided Cognitive-Linquistic     Pain Assessment   Pain Assessment No/denies pain     Cognitive-Linquistic Treatment   Treatment focused on Voice   Skilled Treatment SLP addressed pt's frustration with inability to say /z/ sound. SLP educated pt that because her other articulation is WNL, the listener has context for her imperfect /z/ production in words. Enoucraged pt to cont to practice with /z/but also assured her that listeners were likely to understand. Pt did share with SLP people ask her to speak up sometimes. SLP explained this is likely due to breathiness from her lt vocal fold paralysis. SLP targeted abcominal breathing (AB) today as well in ST. Pt had initial cues necessary for AB at rest, but after 3 minutes produced approx 90% AB vs. clavicular breathing. With simple 1-3 word responses pt maintained AB 100% of the time.     Assessment / Recommendations / Plan   Plan Continue with current plan of care     Progression Toward Goals   Progression toward goals Progressing toward goals          SLP Education -  01/26/17 1102    Education provided Yes   Education Details abdominal breathing, /z/ production in conversation   Person(s) Educated Patient   Methods Explanation;Demonstration;Verbal cues;Handout   Comprehension Verbalized understanding;Returned demonstration;Verbal cues required          SLP Short Term Goals - 01/26/17 1257      SLP SHORT TERM GOAL #1   Title pt will demo vocal adduction HEP with modified independence over two sessions   Baseline 01-26-17   Time 3   Period Weeks   Status On-going     SLP SHORT TERM GOAL #2   Title pt will achieve sustained /a/ of average 7.0 seconds   Time 3   Period Weeks   Status On-going     SLP SHORT TERM GOAL #3   Title pt will  demo abdominal breathing in 5 minutes simple conversation   Time 3   Period Weeks   Status On-going          SLP Long Term Goals - 01/26/17 1258      SLP LONG TERM GOAL #1   Title pt will demo HEP for vocal adduction with modified independence over three sessions   Baseline 01-26-17   Time 7   Period Weeks   Status On-going     SLP LONG TERM GOAL #2   Title pt will demo abdominal breathing in 10 minutes simple conversation over two sessions   Time 7   Period Weeks   Status On-going     SLP LONG TERM GOAL #3   Title pt will achieve 7.5 seconds average with sustained /a/    Time 7   Period Weeks   Status On-going     SLP LONG TERM GOAL #4   Title pt will achieve s/z ratio <3.4 over two sessions   Time 7   Period Weeks   Status On-going     SLP LONG TERM GOAL #5   Title pt will demo safe swallow strategies with modified independence over two sessions   Time 7   Period Weeks   Status New          Plan - 01/26/17 1256    Clinical Impression Statement Pt continues to exhibit high pitch, breathy voice quality. Pt practiced exercises for abdominal breathing (AB) with initial cues needed, and for vocal fold adduction without cues necessary, Pt began to use AB in simple 1-3 word phrases. Pt would benefit from continued skilled ST services for improvement of vocal fold function, safe swallow, and effective communication.Marland Kitchen   Speech Therapy Frequency 2x / week   Duration --  8 weeks   Treatment/Interventions Aspiration precaution training;Pharyngeal strengthening exercises;Diet toleration management by SLP;Compensatory techniques;Internal/external aids;Multimodal communcation approach;SLP instruction and feedback;Patient/family education;Functional tasks   Potential to Achieve Goals Fair   Potential Considerations Severity of impairments;Co-morbidities;Ability to learn/carryover information;Family/community support;Cooperation/participation level   SLP Home Exercise Plan  reviewed, provided additional   Consulted and Agree with Plan of Care Patient      Patient will benefit from skilled therapeutic intervention in order to improve the following deficits and impairments:   Other voice and resonance disorders (CODE)  Dysphagia, pharyngeal    Problem List Patient Active Problem List   Diagnosis Date Noted  . Hypertension 01/09/2017  . Brain metastasis ([facility]) 12/26/2016  . Bone metastasis ([facility]) 12/26/2016  . 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 Hypersensitivity reaction 12/22/2016  . Goals of care, counseling/discussion 12/17/2016  . Encounter for antineoplastic chemotherapy 12/17/2016  .  Adenocarcinoma of left lung, stage 4 ([facility]) 12/16/2016  . Liver metastasis ([facility]) 11/18/2016  . Unilateral vocal cord paralysis 11/10/2016    [facility] ,[place], [place]  01/26/2017, 1:00 PM  [facility] [address] [place], [place], [address] Phone: [phone]   Fax:  [phone]   Name: Karen Vasquez MRN: [id number] Date of Birth: 01-20-1951

## 2017-01-31 ENCOUNTER — Ambulatory Visit: Payer: BLUE CROSS/BLUE SHIELD | Admitting: *Deleted

## 2017-01-31 ENCOUNTER — Telehealth: Payer: Self-pay | Admitting: Radiation Oncology

## 2017-01-31 DIAGNOSIS — R498 Other voice and resonance disorders: Secondary | ICD-10-CM

## 2017-01-31 DIAGNOSIS — R1313 Dysphagia, pharyngeal phase: Secondary | ICD-10-CM | POA: Diagnosis not present

## 2017-01-31 NOTE — Telephone Encounter (Signed)
Phoned Karen Vasquez at [facility] for clarification on message left with [facility]. She is requesting clarification on brain MR order. After reviewing order and offering to fax requisition she reports their location is unable to obtain a 3T MRI. Karen Vasquez understands this RN will inform [facility] of this finding.

## 2017-01-31 NOTE — Therapy (Signed)
[facility] [address] [place], [place], [address] Phone: [phone]   Fax:  [phone]  Speech Language Pathology Treatment  Patient Details  Name: Karen Vasquez MRN: [id number] Date of Birth: 1951-10-04 Referring Provider: Shona Simpson, PA-C  Encounter Date: 01/31/2017      End of Session - 01/31/17 1245    Visit Number 4   Number of Visits 17   Date for SLP Re-Evaluation 03/10/17   SLP Start Time 0930   SLP Stop Time  6962   SLP Time Calculation (min) 45 min   Activity Tolerance Patient tolerated treatment well      Past Medical History:  Diagnosis Date  . Adenocarcinoma of left lung, stage 4 ([place]) 12/16/2016  . Arthritis   . Asthma due to seasonal allergies   . Encounter for antineoplastic chemotherapy 12/17/2016  . GERD (gastroesophageal reflux disease)   . Goals of care, counseling/discussion 12/17/2016  . Hernia of abdominal wall    umbilical  . Hypertension 01/09/2017  . No pertinent past medical history    COLD UPPER RESP STARTED ZPAK 6/14  . Recurrent upper respiratory infection (URI)    recent sinus inf  . Thyroid nodule   . Urine blood    hematuria    Past Surgical History:  Procedure Laterality Date  . ABDOMINAL HYSTERECTOMY  08   vag, rectocele repair , bladder sling  . ANTERIOR CERVICAL DECOMP/DISCECTOMY FUSION  05/09/2012   Procedure: ANTERIOR CERVICAL DECOMPRESSION/DISCECTOMY FUSION 1 LEVEL;  Surgeon: Eustace Moore, MD;  Location: [facility];  Service: Neurosurgery;  Laterality: N/A;  anterior cervical decompression fusion cervical five-six  . BREAST CYST EXCISION  96   fibroadenoma lft  . BREAST SURGERY     03  . CARPAL TUNNEL RELEASE  05   bil  . DILATION AND CURETTAGE OF UTERUS  03    polyps  . TUBAL LIGATION  88    There were no vitals filed for this visit.      Subjective Assessment - 01/31/17 0934    Subjective I have a hard time with the stomach breathing. I have  to really concentrate   Currently in Pain? Yes   Pain Score 3    Pain Location Back   Pain Orientation Lower   Pain Descriptors / Indicators Aching   Pain Type Chronic pain   Pain Onset More than a month ago               ADULT SLP TREATMENT - 01/31/17 0001      General Information   Behavior/Cognition Alert;Cooperative;Pleasant mood     Treatment Provided   Treatment provided Cognitive-Linquistic     Pain Assessment   Pain Assessment 0-10   Pain Score 3    Pain Location low back   Pain Descriptors / Indicators Aching   Pain Intervention(s) Monitored during session     Cognitive-Linquistic Treatment   Treatment focused on Voice;Patient/family/caregiver education   Skilled Treatment Skilled ST session targeted review and practice of vocal fold adduction exercises and diaphragmatic breathing. Pt vocal quality was noticably more hoarse and high pitch today. Pt educated regarding adduction exercises, and proper technique of abdominal breathing. Review of ENT report reveals pt's left vocal fold is paralyzed in the paramedian position, which is ~1.45m from midline. This should allow over approximation of the right focal fold.      Assessment / Recommendations / Plan   Plan Continue with current plan of care  Dysphagia Recommendations   Compensations Slow rate;Small sips/bites  minimize distractions     Progression Toward Goals   Progression toward goals Progressing toward goals          SLP Education - 01/31/17 1244    Education provided Yes   Education Details abdominal breathing, vocal fold adduction, safe swallow strategies   Person(s) Educated Patient   Methods Explanation;Demonstration;Verbal cues   Comprehension Verbalized understanding;Returned demonstration;Verbal cues required          SLP Short Term Goals - 01/31/17 1248      SLP SHORT TERM GOAL #1   Title pt will demo vocal adduction HEP with modified independence over two sessions   Baseline  01-26-17, 01/31/17   Time 2   Period Weeks   Status On-going     SLP SHORT TERM GOAL #2   Title pt will achieve sustained /a/ of average 7.0 seconds   Time 2   Period Weeks   Status On-going     SLP SHORT TERM GOAL #3   Title pt will demo abdominal breathing in 5 minutes simple conversation   Time 2   Period Weeks   Status On-going          SLP Long Term Goals - 01/31/17 1248      SLP LONG TERM GOAL #1   Title pt will demo HEP for vocal adduction with modified independence over three sessions   Baseline 01-26-17, 01/31/17   Time 6   Period Weeks   Status On-going     SLP LONG TERM GOAL #2   Title pt will demo abdominal breathing in 10 minutes simple conversation over two sessions   Baseline 01/31/17   Time 6   Period Weeks   Status On-going     SLP LONG TERM GOAL #3   Title pt will achieve 7.5 seconds average with sustained /a/    Time 6   Period Weeks   Status On-going     SLP LONG TERM GOAL #4   Title pt will achieve s/z ratio <3.4 over two sessions   Time 6   Period Weeks   Status On-going     SLP LONG TERM GOAL #5   Title pt will demo safe swallow strategies with modified independence over two sessions   Time 6   Period Weeks   Status On-going          Plan - 01/31/17 1245    Clinical Impression Statement Pt with increased hoarseness today, as well as decreased MPT on /a/ (max 2.67 sec today, 4.31 last session). Continued education regarding diaphragmatic breathing and vocal fold adduction provided. Recommend continued ST intervention to maximize voice production.    Speech Therapy Frequency 2x / week   Duration --  8 weeks   Treatment/Interventions Aspiration precaution training;Pharyngeal strengthening exercises;Diet toleration management by SLP;Compensatory techniques;Internal/external aids;Multimodal communcation approach;SLP instruction and feedback;Patient/family education;Functional tasks   Potential to Achieve Goals Fair   Potential  Considerations Severity of impairments;Co-morbidities;Ability to learn/carryover information;Family/community support;Cooperation/participation level   SLP Home Exercise Plan reviewed   Consulted and Agree with Plan of Care Patient      Patient will benefit from skilled therapeutic intervention in order to improve the following deficits and impairments:   Other voice and resonance disorders (CODE)    Problem List Patient Active Problem List   Diagnosis Date Noted  . Hypertension 01/09/2017  . Brain metastasis ([place]) 12/26/2016  . Bone metastasis ([place]) 12/26/2016  . Metastasis to adrenal gland (  [place]) 12/26/2016  . Hypersensitivity reaction 12/22/2016  . Goals of care, counseling/discussion 12/17/2016  . Encounter for antineoplastic chemotherapy 12/17/2016  . Adenocarcinoma of left lung, stage 4 ([place]) 12/16/2016  . Liver metastasis ([place]) 11/18/2016  . Unilateral vocal cord paralysis 11/10/2016   Daniyal Tabor B. [place], MSP, CCC-SLP  Shonna Chock 01/31/2017, 12:50 PM  [facility] [address] [place], [place], [address] Phone: [phone]   Fax:  [phone]   Name: Karen Vasquez MRN: [id number] Date of Birth: 22-Dec-1950

## 2017-01-31 NOTE — Telephone Encounter (Signed)
-----   Message from Mallie Darting sent at 01/31/2017  1:50 PM EDT ----- Regarding: Orders Someone named Dawn from [facility] [phone] called. The message only stated she is calling regarding orders for a patient named Samaiya Awadallah no DOB: given. I am assuming it is for this patient, because this Ms. Homeyer finished radiation treatment on 01/23/17 and she has a follow up appointment scheduled with Dr. Tammi Klippel on 02/08/17.  Thank you,   Shelle Iron Ext: [phone]

## 2017-02-02 ENCOUNTER — Ambulatory Visit (HOSPITAL_BASED_OUTPATIENT_CLINIC_OR_DEPARTMENT_OTHER): Payer: BLUE CROSS/BLUE SHIELD | Admitting: Internal Medicine

## 2017-02-02 ENCOUNTER — Ambulatory Visit (HOSPITAL_BASED_OUTPATIENT_CLINIC_OR_DEPARTMENT_OTHER): Payer: BLUE CROSS/BLUE SHIELD

## 2017-02-02 ENCOUNTER — Ambulatory Visit: Payer: BLUE CROSS/BLUE SHIELD | Admitting: Nutrition

## 2017-02-02 ENCOUNTER — Other Ambulatory Visit (HOSPITAL_BASED_OUTPATIENT_CLINIC_OR_DEPARTMENT_OTHER): Payer: BLUE CROSS/BLUE SHIELD

## 2017-02-02 ENCOUNTER — Encounter: Payer: Self-pay | Admitting: Medical Oncology

## 2017-02-02 ENCOUNTER — Other Ambulatory Visit: Payer: Self-pay | Admitting: Internal Medicine

## 2017-02-02 ENCOUNTER — Encounter: Payer: Self-pay | Admitting: Internal Medicine

## 2017-02-02 ENCOUNTER — Telehealth: Payer: Self-pay | Admitting: Internal Medicine

## 2017-02-02 VITALS — BP 111/70 | HR 79

## 2017-02-02 VITALS — BP 145/76 | HR 97 | Temp 99.2°F | Resp 18 | Ht 64.0 in | Wt 139.8 lb

## 2017-02-02 DIAGNOSIS — C3412 Malignant neoplasm of upper lobe, left bronchus or lung: Secondary | ICD-10-CM

## 2017-02-02 DIAGNOSIS — C7971 Secondary malignant neoplasm of right adrenal gland: Secondary | ICD-10-CM

## 2017-02-02 DIAGNOSIS — I10 Essential (primary) hypertension: Secondary | ICD-10-CM

## 2017-02-02 DIAGNOSIS — C7972 Secondary malignant neoplasm of left adrenal gland: Secondary | ICD-10-CM

## 2017-02-02 DIAGNOSIS — C3492 Malignant neoplasm of unspecified part of left bronchus or lung: Secondary | ICD-10-CM

## 2017-02-02 DIAGNOSIS — R0609 Other forms of dyspnea: Secondary | ICD-10-CM | POA: Diagnosis not present

## 2017-02-02 DIAGNOSIS — C787 Secondary malignant neoplasm of liver and intrahepatic bile duct: Secondary | ICD-10-CM | POA: Diagnosis not present

## 2017-02-02 DIAGNOSIS — Z5111 Encounter for antineoplastic chemotherapy: Secondary | ICD-10-CM

## 2017-02-02 DIAGNOSIS — C7951 Secondary malignant neoplasm of bone: Secondary | ICD-10-CM

## 2017-02-02 DIAGNOSIS — Z5112 Encounter for antineoplastic immunotherapy: Secondary | ICD-10-CM | POA: Diagnosis not present

## 2017-02-02 DIAGNOSIS — C7931 Secondary malignant neoplasm of brain: Secondary | ICD-10-CM

## 2017-02-02 LAB — COMPREHENSIVE METABOLIC PANEL
ALT: 20 U/L (ref 0–55)
ANION GAP: 10 meq/L (ref 3–11)
AST: 19 U/L (ref 5–34)
Albumin: 3.3 g/dL — ABNORMAL LOW (ref 3.5–5.0)
Alkaline Phosphatase: 112 U/L (ref 40–150)
BUN: 15.8 mg/dL (ref 7.0–26.0)
CALCIUM: 9.3 mg/dL (ref 8.4–10.4)
CHLORIDE: 108 meq/L (ref 98–109)
CO2: 24 mEq/L (ref 22–29)
Creatinine: 1.1 mg/dL (ref 0.6–1.1)
EGFR: 50 mL/min/{1.73_m2} — AB (ref >90)
Glucose: 88 mg/dl (ref 70–140)
POTASSIUM: 4.4 meq/L (ref 3.5–5.1)
Sodium: 142 mEq/L (ref 136–145)
Total Bilirubin: 0.4 mg/dL (ref 0.20–1.20)
Total Protein: 7 g/dL (ref 6.4–8.3)

## 2017-02-02 LAB — CBC WITH DIFFERENTIAL/PLATELET
BASO%: 0.3 % (ref 0.0–2.0)
BASOS ABS: 0 10*3/uL (ref 0.0–0.1)
EOS ABS: 1 10*3/uL — AB (ref 0.0–0.5)
EOS%: 18.8 % — ABNORMAL HIGH (ref 0.0–7.0)
HEMATOCRIT: 34.4 % — AB (ref 34.8–46.6)
HGB: 11.6 g/dL (ref 11.6–15.9)
LYMPH#: 0.5 10*3/uL — AB (ref 0.9–3.3)
LYMPH%: 10 % — ABNORMAL LOW (ref 14.0–49.7)
MCH: 29.4 pg (ref 25.1–34.0)
MCHC: 33.5 g/dL (ref 31.5–36.0)
MCV: 87.8 fL (ref 79.5–101.0)
MONO#: 0.7 10*3/uL (ref 0.1–0.9)
MONO%: 13.6 % (ref 0.0–14.0)
NEUT#: 3 10*3/uL (ref 1.5–6.5)
NEUT%: 57.3 % (ref 38.4–76.8)
PLATELETS: 178 10*3/uL (ref 145–400)
RBC: 3.92 10*6/uL (ref 3.70–5.45)
RDW: 18.4 % — ABNORMAL HIGH (ref 11.2–14.5)
WBC: 5.3 10*3/uL (ref 3.9–10.3)

## 2017-02-02 MED ORDER — SODIUM CHLORIDE 0.9 % IV SOLN
420.0000 mg | Freq: Once | INTRAVENOUS | Status: AC
  Administered 2017-02-02: 420 mg via INTRAVENOUS
  Filled 2017-02-02: qty 42

## 2017-02-02 MED ORDER — SODIUM CHLORIDE 0.9 % IV SOLN
15.0000 mg/kg | Freq: Once | INTRAVENOUS | Status: AC
  Administered 2017-02-02: 1000 mg via INTRAVENOUS
  Filled 2017-02-02: qty 32

## 2017-02-02 MED ORDER — OXYCODONE HCL 5 MG PO TABS: 5.0000 mg | ORAL_TABLET | ORAL | 0 refills | Status: DC | PRN

## 2017-02-02 MED ORDER — PALONOSETRON HCL INJECTION 0.25 MG/5ML
INTRAVENOUS | Status: AC
Start: 2017-02-02 — End: 2017-02-02
  Filled 2017-02-02: qty 5

## 2017-02-02 MED ORDER — SODIUM CHLORIDE 0.9 % IV SOLN
Freq: Once | INTRAVENOUS | Status: AC
  Administered 2017-02-02: 12:00:00 via INTRAVENOUS
  Filled 2017-02-02: qty 5

## 2017-02-02 MED ORDER — DIPHENHYDRAMINE HCL 50 MG/ML IJ SOLN
25.0000 mg | Freq: Once | INTRAMUSCULAR | Status: AC
  Administered 2017-02-02: 25 mg via INTRAVENOUS

## 2017-02-02 MED ORDER — SODIUM CHLORIDE 0.9 % IV SOLN
Freq: Once | INTRAVENOUS | Status: AC
  Administered 2017-02-02: 12:00:00 via INTRAVENOUS

## 2017-02-02 MED ORDER — DIPHENHYDRAMINE HCL 50 MG/ML IJ SOLN
INTRAMUSCULAR | Status: AC
  Filled 2017-02-02: qty 1

## 2017-02-02 MED ORDER — PALONOSETRON HCL INJECTION 0.25 MG/5ML
0.2500 mg | Freq: Once | INTRAVENOUS | Status: AC
  Administered 2017-02-02: 0.25 mg via INTRAVENOUS

## 2017-02-02 MED ORDER — FAMOTIDINE IN NACL 20-0.9 MG/50ML-% IV SOLN
INTRAVENOUS | Status: AC
  Filled 2017-02-02: qty 50

## 2017-02-02 MED ORDER — SODIUM CHLORIDE 0.9 % IV SOLN
508.0000 mg/m2 | Freq: Once | INTRAVENOUS | Status: AC
  Administered 2017-02-02: 900 mg via INTRAVENOUS
  Filled 2017-02-02: qty 20

## 2017-02-02 MED ORDER — FAMOTIDINE IN NACL 20-0.9 MG/50ML-% IV SOLN
20.0000 mg | Freq: Once | INTRAVENOUS | Status: AC
  Administered 2017-02-02: 20 mg via INTRAVENOUS

## 2017-02-02 NOTE — Patient Instructions (Signed)
[facility] Discharge Instructions for Patients Receiving Chemotherapy  Today you received the following chemotherapy agents: Avastin, Alimta and Carboplatin   To help prevent nausea and vomiting after your treatment, we encourage you to take your nausea medication as directed.    If you develop nausea and vomiting that is not controlled by your nausea medication, call the clinic.   BELOW ARE SYMPTOMS THAT SHOULD BE REPORTED IMMEDIATELY:  *FEVER GREATER THAN 100.5 F  *CHILLS WITH OR WITHOUT FEVER  NAUSEA AND VOMITING THAT IS NOT CONTROLLED WITH YOUR NAUSEA MEDICATION  *UNUSUAL SHORTNESS OF BREATH  *UNUSUAL BRUISING OR BLEEDING  TENDERNESS IN MOUTH AND THROAT WITH OR WITHOUT PRESENCE OF ULCERS  *URINARY PROBLEMS  *BOWEL PROBLEMS  UNUSUAL RASH Items with * indicate a potential emergency and should be followed up as soon as possible.  Feel free to call the clinic you have any questions or concerns. The clinic phone number is [phone].  Please show the CHEMO ALERT CARD at check-in to the Emergency Department and triage nurse.   

## 2017-02-02 NOTE — Progress Notes (Signed)
[facility] [phone]   [phone]  OFFICE PROGRESS NOTE  [name], MD [address] [place] [address]  DIAGNOSIS: Stage IVb (T1b, N2, M1c) non-small cell lung cancer, adenocarcinoma in a never smoker patient diagnosed in January 2018 and presented with left upper lobe lung mass, mediastinal lymphadenopathy as well as metastatic disease to the bone, liver, adrenal glands as well as abdominal wall nodules and brain metastasis. PDL 1 expression 70%.  Genomic Alterations Identified? KRAS G12V NOTCH2 rearrangement exon 27 DS28 splice site [id number]>X Additional Findings? Microsatellite status MS-Stable Tumor Mutation Burden TMB-Intermediate; 8 Muts/Mb Additional Disease-relevant Genes with No Reportable Alterations Identified? EGFR ALK BRAF MET RET ERBB2 ROS1  PRIOR THERAPY: Palliative radiotherapy to the chest and sacrum under the care of Dr. Tammi Klippel..  CURRENT THERAPY: Systemic chemotherapy with carboplatin for AUC of 5, Alimta 500 MG/M2 and Avastin 15 MG/KG every 3 weeks. Status post 2 cycles. First dose was given 12/22/2016.  INTERVAL HISTORY: PANSY OSTROVSKY 66 y.o. female returns to the clinic today for follow-up visit accompanied by her daughter. The patient is feeling fine today with no specific complaints except for the persistent hoarseness of her voice. She completed a course of palliative radiotherapy to the chest and sacrum. She also completed radiotherapy to the brain lesions. She is feeling a little bit better today. She denied having any significant nausea or vomiting. She has no significant weight loss or night sweats. She has no chest pain but continues to have shortness of breath with exertion with no cough or hemoptysis. She is here today for evaluation before starting cycle #3 of her treatment.   MEDICAL HISTORY: Past Medical History:  Diagnosis Date  . Adenocarcinoma of left lung, stage 4 ([place]) 12/16/2016    . Arthritis   . Asthma due to seasonal allergies   . Encounter for antineoplastic chemotherapy 12/17/2016  . GERD (gastroesophageal reflux disease)   . Goals of care, counseling/discussion 12/17/2016  . Hernia of abdominal wall    umbilical  . Hypertension 01/09/2017  . No pertinent past medical history    COLD UPPER RESP STARTED ZPAK 6/14  . Recurrent upper respiratory infection (URI)    recent sinus inf  . Thyroid nodule   . Urine blood    hematuria    ALLERGIES:  is allergic to prednisone; fenofibrate; quinolones; statins; and septra [sulfamethoxazole-trimethoprim].  MEDICATIONS:  Current Outpatient Prescriptions  Medication Sig Dispense Refill  . Cholecalciferol (VITAMIN D) 2000 units tablet Take 2,000 Units by mouth daily.    Marland Kitchen conjugated estrogens (PREMARIN) vaginal cream Place 1 Applicatorful vaginally daily.    . Cranberry 500 MG TABS Take 1 tablet by mouth 2 (two) times daily.    Marland Kitchen ezetimibe (ZETIA) 10 MG tablet Take 10 mg by mouth daily.    . folic acid (FOLVITE) 1 MG tablet Take 1 tablet (1 mg total) by mouth daily. 30 tablet 4  . ketotifen (ZADITOR) 0.025 % ophthalmic solution Place 1 drop into both eyes 2 (two) times daily.    Marland Kitchen loratadine (CLARITIN) 10 MG tablet Take 10 mg by mouth daily.    Marland Kitchen LORazepam (ATIVAN) 0.5 MG tablet 1 tablet po 30 minutes prior to radiation or MRI 30 tablet 0  . Multiple Vitamin (MULTIVITAMIN) tablet Take 1 tablet by mouth daily.    Marland Kitchen OLANZapine (ZYPREXA) 10 MG tablet Take 1 tablet (10 mg total) by mouth at bedtime. For Nausea 30 tablet 1  . ondansetron (  ZOFRAN) 8 MG tablet Take 1 tablet (8 mg total) by mouth every 8 (eight) hours as needed for nausea or vomiting. 30 tablet 0  . oxyCODONE (OXY IR/ROXICODONE) 5 MG immediate release tablet Take 1-3 tablets (5-15 mg total) by mouth every 4 (four) hours as needed for severe pain. 100 tablet 0  . pantoprazole (PROTONIX) 40 MG tablet Take 40 mg by mouth 2 (two) times daily.     . prochlorperazine  (COMPAZINE) 10 MG tablet TAKE ONE TABLET BY MOUTH EVERY 6 HOURS AS NEEDED FOR NAUSEA/VOMITING 30 tablet 0  . raloxifene (EVISTA) 60 MG tablet Take 60 mg by mouth daily.    . sodium chloride (OCEAN) 0.65 % nasal spray Place 1 spray into the nose daily as needed for congestion.     . sucralfate (CARAFATE) 1 g tablet Take 1 tablet (1 g total) by mouth 4 (four) times daily -  with meals and at bedtime. 5 minutes before food 120 tablet 5  . trimethoprim (TRIMPEX) 100 MG tablet Take 100 mg by mouth daily.      No current facility-administered medications for this visit.     SURGICAL HISTORY:  Past Surgical History:  Procedure Laterality Date  . ABDOMINAL HYSTERECTOMY  08   vag, rectocele repair , bladder sling  . ANTERIOR CERVICAL DECOMP/DISCECTOMY FUSION  05/09/2012   Procedure: ANTERIOR CERVICAL DECOMPRESSION/DISCECTOMY FUSION 1 LEVEL;  Surgeon: Eustace Moore, MD;  Location: [facility];  Service: Neurosurgery;  Laterality: N/A;  anterior cervical decompression fusion cervical five-six  . BREAST CYST EXCISION  96   fibroadenoma lft  . BREAST SURGERY     03  . CARPAL TUNNEL RELEASE  05   bil  . DILATION AND CURETTAGE OF UTERUS  03    polyps  . TUBAL LIGATION  88    REVIEW OF SYSTEMS:  A comprehensive review of systems was negative except for: Constitutional: positive for fatigue Ears, nose, mouth, throat, and face: positive for hoarseness Respiratory: positive for dyspnea on exertion   PHYSICAL EXAMINATION: General appearance: alert, cooperative, fatigued and no distress Head: Normocephalic, without obvious abnormality, atraumatic Neck: no adenopathy, no JVD, supple, symmetrical, trachea midline and thyroid not enlarged, symmetric, no tenderness/mass/nodules Lymph nodes: Cervical, supraclavicular, and axillary nodes normal. Resp: clear to auscultation bilaterally Back: symmetric, no curvature. ROM normal. No CVA tenderness. Cardio: regular rate and rhythm, S1, S2 normal, no murmur,  click, rub or gallop GI: soft, non-tender; bowel sounds normal; no masses,  no organomegaly Extremities: extremities normal, atraumatic, no cyanosis or edema  ECOG PERFORMANCE STATUS: 1 - Symptomatic but completely ambulatory  Blood pressure (!) 145/76, pulse 97, temperature 99.2 F (37.3 C), temperature source Oral, resp. rate 18, height 5' 4"  (1.626 m), weight 139 lb 12.8 oz (63.4 kg), SpO2 98 %.  LABORATORY DATA: Lab Results  Component Value Date   WBC 5.3 02/02/2017   HGB 11.6 02/02/2017   HCT 34.4 (L) 02/02/2017   MCV 87.8 02/02/2017   PLT 178 02/02/2017      Chemistry      Component Value Date/Time   NA 142 02/02/2017 0935   K 4.4 02/02/2017 0935   CL 101 05/01/2012 0927   CO2 24 02/02/2017 0935   BUN 15.8 02/02/2017 0935   CREATININE 1.1 02/02/2017 0935      Component Value Date/Time   CALCIUM 9.3 02/02/2017 0935   ALKPHOS 112 02/02/2017 0935   AST 19 02/02/2017 0935   ALT 20 02/02/2017 0935   BILITOT 0.40  02/02/2017 0935       RADIOGRAPHIC STUDIES: Dg Swallowing Func-speech Pathology  Result Date: 01/05/2017 Objective Swallowing Evaluation: Type of Study: MBS-Modified Barium Swallow Study Patient Details Name: JAIYANNA SAFRAN MRN: [id number] Date of Birth: 1951-11-03 Today's Date: 01/05/2017 Time: 38 Past Medical History: Past Medical History: Diagnosis Date . Adenocarcinoma of left lung, stage 4 ([place]) 12/16/2016 . Arthritis  . Asthma due to seasonal allergies  . Encounter for antineoplastic chemotherapy 12/17/2016 . GERD (gastroesophageal reflux disease)  . Goals of care, counseling/discussion 12/17/2016 . Hernia of abdominal wall   umbilical . No pertinent past medical history   COLD UPPER RESP STARTED ZPAK 6/14 . Recurrent upper respiratory infection (URI)   recent sinus inf . Thyroid nodule  . Urine blood   hematuria Past Surgical History: Past Surgical History: Procedure Laterality Date . ABDOMINAL HYSTERECTOMY  08  vag, rectocele repair , bladder sling . ANTERIOR  CERVICAL DECOMP/DISCECTOMY FUSION  05/09/2012  Procedure: ANTERIOR CERVICAL DECOMPRESSION/DISCECTOMY FUSION 1 LEVEL;  Surgeon: Eustace Moore, MD;  Location: [facility];  Service: Neurosurgery;  Laterality: N/A;  anterior cervical decompression fusion cervical five-six . BREAST CYST EXCISION  96  fibroadenoma lft . BREAST SURGERY    03 . CARPAL TUNNEL RELEASE  05  bil . DILATION AND CURETTAGE OF UTERUS  03   polyps . TUBAL LIGATION  78 HPI: 66 year old female referred for outpatient MBS to objectively assess swallow function and safety, due to diffficulty with thin liquids, and inability to clear throat. PMH significant for lung CA, s/p 2 radiation treatments (12 remaining), reflux (on PPI2x/day), ACDF, left vocal fold paralysis. Subjective: Pt seen in radiology for outpatient MBS. No family present. Assessment / Plan / Recommendation CHL IP CLINICAL IMPRESSIONS 01/05/2017 Clinical Impression Mild oropharyngeal dysphagia, characterized by piecemeal deglutition of puree and solid consistencies, and delayed swallow across consistencies, with trigger seen at the vallecula. No penetration or aspiration, and no post-swallow residue seen on any po presentation. Esophageal sweep revealed it to be clear. Pt had no difficulty managing barium tablet with water, but reports occasional globus sensation after taking meds. Pt was noted to clear her throat several times during this study, but no airway compromise was observed. Pt has currently been through 2 radiation treatments for lung CA. She was encouraged to be mindful of possible changes in esophageal motility due to effects of radiation. Pt was also encouraged to minimize distractions, and take small boluses at a slow rate to decrease risk of getting choked. Pt to follow up with outpatient SLP, so will defer additional education to them. SLP Visit Diagnosis Dysphagia, oropharyngeal phase (R13.12) Impact on safety and function Mild aspiration risk   No flowsheet data found.   Prognosis 01/05/2017 Prognosis for Safe Diet Advancement Good CHL IP DIET RECOMMENDATION 01/05/2017 SLP Diet Recommendations Regular solids;Thin liquid Liquid Administration via Cup;Straw Medication Administration Whole meds with liquid Compensations Minimize environmental distractions;Slow rate;Small sips/bites Postural Changes Seated upright at 90 degrees   CHL IP OTHER RECOMMENDATIONS 01/05/2017 Oral Care Recommendations Oral care BID   CHL IP FOLLOW UP RECOMMENDATIONS 01/05/2017 Follow up Recommendations Outpatient SLP    CHL IP ORAL PHASE 01/05/2017 Oral Phase Impaired Oral - Puree Piecemeal swallowing Oral - Regular Piecemeal swallowing  CHL IP PHARYNGEAL PHASE 01/05/2017 Pharyngeal Phase Impaired Pharyngeal- Thin Cup Delayed swallow initiation-vallecula Pharyngeal- Thin Straw Delayed swallow initiation-vallecula Pharyngeal- Puree Delayed swallow initiation-vallecula Pharyngeal- Regular Delayed swallow initiation-vallecula  CHL IP CERVICAL ESOPHAGEAL PHASE 01/05/2017 Cervical Esophageal Phase WFL CHL IP  GO 01/05/2017 Functional Assessment Tool Used (No Data) Functional Limitations Swallowing Swallow Current Status (G0982) CI Swallow Goal Status (U6751) CI Swallow Discharge Status (T8242) CI Celia B. Quentin Ore [facility], CCC-SLP [phone] Shonna Chock 01/05/2017, 1:32 PM    ASSESSMENT AND PLAN:  This is a very pleasant 66 years old white female with a stage IV non-small cell lung cancer, adenocarcinoma with no actionable mutations but positive PDL 1 expression of 70%. She is currently undergoing systemic chemotherapy with carboplatin, Alimta and Avastin status post 2 cycles. She is tolerated the treatment well. I recommended for the patient to proceed with cycle #3 today. She would come back for follow-up visit in 3 weeks for evaluation after repeating CT scan of the chest, abdomen and pelvis for restaging of her disease. For hypertension, she will continue to monitor her blood pressure closely and to  report to her primary care physician for adjustment of her doses if needed. The patient voices understanding of current disease status and treatment options and is in agreement with the current care plan.  All questions were answered. The patient knows to call the clinic with any problems, questions or concerns. We can certainly see the patient much sooner if necessary. Disclaimer: This note was dictated with voice recognition software. Similar sounding words can inadvertently be transcribed and may not be corrected upon review.

## 2017-02-02 NOTE — Telephone Encounter (Signed)
Gave patient AVS - Central radiology to contact patient with CT schedule.

## 2017-02-02 NOTE — Progress Notes (Signed)
Patient was identified to be at risk for malnutrition on the MST secondary to weight loss and poor appetite.  66 year old female diagnosed with stage IV lung cancer status post radiation therapy beginning chemotherapy.  Past medical history includes GERD and anemia.  Medications include vitamin D, cranberry, multivitamin, Ativan, Protonix, Compazine.  Labs include glucose 169 and albumin 3.4 on February 14.  Height: 64 inches. Weight: 139.8 pounds. Usual body weight: 160 pounds December 2017. BMI: 24.0  Patient reports after radiation she had some nausea, thickened saliva and taste alterations. She reports this has improved to some extent. Patient endorses 20 pound weight loss. Patient does not enjoy oral nutrition supplements but will drink chocolate milk.  Nutrition diagnosis:  Unintended weight loss related to stage IV lung cancer and associated treatments as evidenced by 13% weight loss in 3 months.  Severe malnutrition in the context of chronic illness secondary to greater than 7.5% weight loss over 3 months, less than or equal to 75% of energy intake for greater than one month, and severe depletion of body fat and muscle mass on physical exam.  Intervention: Educated patient to consume high-calorie high-protein foods in small frequent meals and snacks. I educated patient regarding strategies for improving dry mouth and thick saliva. Educated patient regarding strategies to improve taste alterations. Fact sheets were given. Recommended patient try Carnation breakfast essentials made with whole milk and provided coupons. Questions were answered.  Teach back method used.  Contact information provided.  Monitoring, evaluation, goals: Patient will increased calories and protein in small frequent meals and snacks to minimize further weight loss.  Next visit: Thursday, May 3, during infusion.  **Disclaimer: This note was dictated with voice recognition software. Similar sounding  words can inadvertently be transcribed and this note may contain transcription errors which may not have been corrected upon publication of note.**

## 2017-02-02 NOTE — Telephone Encounter (Signed)
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 Call dtr when letter is ready.

## 2017-02-03 ENCOUNTER — Ambulatory Visit: Payer: BLUE CROSS/BLUE SHIELD

## 2017-02-03 DIAGNOSIS — R1313 Dysphagia, pharyngeal phase: Secondary | ICD-10-CM

## 2017-02-03 DIAGNOSIS — R498 Other voice and resonance disorders: Secondary | ICD-10-CM

## 2017-02-03 NOTE — Therapy (Signed)
[facility] [address] [place], [place], [address] Phone: [phone]   Fax:  [phone]  Speech Language Pathology Treatment  Patient Details  Name: Karen Vasquez MRN: [id number] Date of Birth: [birth year] Referring Provider: Shona Simpson, PA-C  Encounter Date: 02/03/2017      End of Session - 02/03/17 0815    Visit Number 5   Number of Visits 17   Date for SLP Re-Evaluation 03/10/17   SLP Start Time 0808   SLP Stop Time  0847   SLP Time Calculation (min) 39 min   Activity Tolerance Patient tolerated treatment well      Past Medical History:  Diagnosis Date  . Adenocarcinoma of left lung, stage 4 ([place]) 12/16/2016  . Arthritis   . Asthma due to seasonal allergies   . Encounter for antineoplastic chemotherapy 12/17/2016  . GERD (gastroesophageal reflux disease)   . Goals of care, counseling/discussion 12/17/2016  . Hernia of abdominal wall    umbilical  . Hypertension 01/09/2017  . No pertinent past medical history    COLD UPPER RESP STARTED [facility] 6/14  . Recurrent upper respiratory infection (URI)    recent sinus inf  . Thyroid nodule   . Urine blood    hematuria    Past Surgical History:  Procedure Laterality Date  . ABDOMINAL HYSTERECTOMY  08   vag, rectocele repair , bladder sling  . ANTERIOR CERVICAL DECOMP/DISCECTOMY FUSION  05/09/2012   Procedure: ANTERIOR CERVICAL DECOMPRESSION/DISCECTOMY FUSION 1 LEVEL;  Surgeon: Eustace Moore, MD;  Location: [facility];  Service: Neurosurgery;  Laterality: N/A;  anterior cervical decompression fusion cervical five-six  . BREAST CYST EXCISION  96   fibroadenoma lft  . BREAST SURGERY     03  . CARPAL TUNNEL RELEASE  05   bil  . DILATION AND CURETTAGE OF UTERUS  03    polyps  . TUBAL LIGATION  88    There were no vitals filed for this visit.             ADULT SLP TREATMENT - 02/03/17 0844      General Information   Behavior/Cognition  Alert;Cooperative;Pleasant mood     Treatment Provided   Treatment provided Cognitive-Linquistic     Cognitive-Linquistic Treatment   Treatment focused on Voice   Skilled Treatment SLP assessed pt's competency with voice HEP - WNL performance. With abdominal breathing (AB) pt practiced with SLP rare A needed for belly movement instead of chest movement. Pt reported she understands why she needs to do AB now, and this will make it easier. Pt success in structured simple tasks was 90% AB. Pt admittedshe has not done HEP nor pracitced for AB as directed. SLP encouraged her to do so.     Assessment / Recommendations / Plan   Plan Continue with current plan of care     Progression Toward Goals   Progression toward goals Progressing toward goals          SLP Education - 02/03/17 1351    Education provided Yes   Education Details need to practice both abdominal breathing and vocal HEP   Person(s) Educated Patient   Methods Explanation   Comprehension Verbalized understanding          SLP Short Term Goals - 02/03/17 1353      SLP SHORT TERM GOAL #1   Title pt will demo vocal adduction HEP with modified independence over two sessions   Status Achieved  SLP SHORT TERM GOAL #2   Title pt will achieve sustained /a/ of average 7.0 seconds   Time 2   Period Weeks   Status On-going     SLP SHORT TERM GOAL #3   Title pt will demo abdominal breathing in 5 minutes simple conversation   Time 2   Period Weeks   Status On-going          SLP Long Term Goals - 02/03/17 1353      SLP LONG TERM GOAL #1   Title pt will demo HEP for vocal adduction with modified independence over three sessions   Status Achieved     SLP LONG TERM GOAL #2   Title pt will demo abdominal breathing in 10 minutes simple conversation over two sessions   Baseline 01/31/17   Time 6   Period Weeks   Status On-going     SLP LONG TERM GOAL #3   Title pt will achieve 7.5 seconds average with sustained  /a/    Time 6   Period Weeks   Status On-going     SLP LONG TERM GOAL #4   Title pt will achieve s/z ratio <3.4 over two sessions   Time 6   Period Weeks   Status On-going     SLP LONG TERM GOAL #5   Title pt will demo safe swallow strategies with modified independence over two sessions   Time 6   Period Weeks   Status On-going          Plan - 02/03/17 1351    Clinical Impression Statement Pt continues to exhibit breathy voice quality, however today more increasingly breathy than last week's appointmen tiwth this SLP. Pt practiced exercises for abdominal breathing (AB) at rest with initial cues needed, and for vocal fold adduction without cues necessary, Pt used AB in phrase and sentence responses with rare min A. Pt would benefit from continued skilled ST services for improvement of vocal fold function, safe swallow, and effective communication.Marland Kitchen   Speech Therapy Frequency 2x / week   Duration --  8 weeks   Treatment/Interventions Aspiration precaution training;Pharyngeal strengthening exercises;Diet toleration management by SLP;Compensatory techniques;Internal/external aids;Multimodal communcation approach;SLP instruction and feedback;Patient/family education;Functional tasks   Potential to Achieve Goals Fair   Potential Considerations Severity of impairments;Co-morbidities;Ability to learn/carryover information;Family/community support;Cooperation/participation level   SLP Home Exercise Plan reviewed, provided additional   Consulted and Agree with Plan of Care Patient      Patient will benefit from skilled therapeutic intervention in order to improve the following deficits and impairments:   Other voice and resonance disorders (CODE)  Dysphagia, pharyngeal    Problem List Patient Active Problem List   Diagnosis Date Noted  . Hypertension 01/09/2017  . Brain metastasis ([place]) 12/26/2016  . Bone metastasis ([place]) 12/26/2016  . Metastasis to adrenal gland ([place]) 12/26/2016   . Hypersensitivity reaction 12/22/2016  . Goals of care, counseling/discussion 12/17/2016  . Encounter for antineoplastic chemotherapy 12/17/2016  . Adenocarcinoma of left lung, stage 4 ([place]) 12/16/2016  . Liver metastasis ([place]) 11/18/2016  . Unilateral vocal cord paralysis 11/10/2016    [facility] ,MS, CCC-SLP  02/03/2017, 1:54 PM  [facility] [address] [place], [place], [address] Phone: [phone]   Fax:  [phone]   Name: Karen Vasquez MRN: [id number] Date of Birth: 10/20/1951

## 2017-02-03 NOTE — Patient Instructions (Signed)
  Please complete the assigned speech therapy homework prior to your next session and return it to the speech therapist at your next visit.  

## 2017-02-06 ENCOUNTER — Ambulatory Visit: Payer: BLUE CROSS/BLUE SHIELD

## 2017-02-06 ENCOUNTER — Ambulatory Visit
Admission: RE | Admit: 2017-02-06 | Discharge: 2017-02-06 | Disposition: A | Discharge status: Home / Self Care | Payer: BLUE CROSS/BLUE SHIELD | Source: Ambulatory Visit | Attending: Radiation Oncology | Admitting: Radiation Oncology

## 2017-02-06 ENCOUNTER — Other Ambulatory Visit: Payer: BLUE CROSS/BLUE SHIELD

## 2017-02-06 DIAGNOSIS — R498 Other voice and resonance disorders: Secondary | ICD-10-CM

## 2017-02-06 DIAGNOSIS — C7931 Secondary malignant neoplasm of brain: Secondary | ICD-10-CM

## 2017-02-06 DIAGNOSIS — R1313 Dysphagia, pharyngeal phase: Secondary | ICD-10-CM | POA: Diagnosis not present

## 2017-02-06 DIAGNOSIS — C3490 Malignant neoplasm of unspecified part of unspecified bronchus or lung: Secondary | ICD-10-CM | POA: Diagnosis not present

## 2017-02-06 MED ORDER — GADOBENATE DIMEGLUMINE 529 MG/ML IV SOLN
13.0000 mL | Freq: Once | INTRAVENOUS | Status: AC | PRN
  Administered 2017-02-06: 13 mL via INTRAVENOUS

## 2017-02-06 NOTE — Patient Instructions (Signed)
Take in more air when your are practicing abdominal breathing.

## 2017-02-06 NOTE — Therapy (Signed)
[facility] [address] [place], [place], [address] Phone: [phone]   Fax:  [phone]  Speech Language Pathology Treatment  Patient Details  Name: Karen Vasquez MRN: [id number] Date of Birth: Aug 13, 1951 Referring Provider: Shona Simpson, PA-C  Encounter Date: 02/06/2017      End of Session - 02/06/17 1052    Visit Number 6   Number of Visits 17   Date for SLP Re-Evaluation 03/10/17   SLP Start Time 0846   SLP Stop Time  0928   SLP Time Calculation (min) 42 min   Activity Tolerance Patient tolerated treatment well      Past Medical History:  Diagnosis Date  . Adenocarcinoma of left lung, stage 4 ([place]) 12/16/2016  . Arthritis   . Asthma due to seasonal allergies   . Encounter for antineoplastic chemotherapy 12/17/2016  . GERD (gastroesophageal reflux disease)   . Goals of care, counseling/discussion 12/17/2016  . Hernia of abdominal wall    umbilical  . Hypertension 01/09/2017  . No pertinent past medical history    COLD UPPER RESP STARTED [facility]PAK 6/14  . Recurrent upper respiratory infection (URI)    recent sinus inf  . Thyroid nodule   . Urine blood    hematuria    Past Surgical History:  Procedure Laterality Date  . ABDOMINAL HYSTERECTOMY  08   vag, rectocele repair , bladder sling  . ANTERIOR CERVICAL DECOMP/DISCECTOMY FUSION  05/09/2012   Procedure: ANTERIOR CERVICAL DECOMPRESSION/DISCECTOMY FUSION 1 LEVEL;  Surgeon: Eustace Moore, MD;  Location: [facility];  Service: Neurosurgery;  Laterality: N/A;  anterior cervical decompression fusion cervical five-six  . BREAST CYST EXCISION  96   fibroadenoma lft  . BREAST SURGERY     03  . CARPAL TUNNEL RELEASE  05   bil  . DILATION AND CURETTAGE OF UTERUS  03    polyps  . TUBAL LIGATION  88    There were no vitals filed for this visit.      Subjective Assessment - 02/06/17 0938    Subjective "I didn't do anything (HEP) yesterday - I was so  nauseous."   Currently in Pain? Yes   Pain Score 4    Pain Location Abdomen   Pain Orientation Mid   Pain Descriptors / Indicators Cramping   Pain Type Acute pain   Pain Onset Yesterday   Pain Frequency Intermittent   Aggravating Factors  nothing   Pain Relieving Factors defecation               ADULT SLP TREATMENT - 02/06/17 0940      General Information   Behavior/Cognition Alert;Cooperative;Pleasant mood     Treatment Provided   Treatment provided Cognitive-Linquistic     Cognitive-Linquistic Treatment   Treatment focused on Voice   Skilled Treatment Pt's voice less breathy, sounds like it did second to last session with SLP. HEP completed with modified indpendence. Work in therapy focused on abdominal breathing (AB). In short answer (1 sentence) responses pt maintained AB 100% of the time however told SLP she felt light-headed. SLP instructed pt to take deeper breaths. Pt related she was less light headed after 10 minutes.  Pt stated she had light headedness over the weekend when practicing AB as well. Minor light headedness cont'd throughout the session when focus was on AB. SLP encouraged pt to take in more air routeinely with AB to see if this ipmroves.     Assessment / Recommendations / Plan  Plan Continue with current plan of care     Progression Toward Goals   Progression toward goals Progressing toward goals          SLP Education - 02/06/17 1052    Education provided Yes   Education Details incr'd inhalation to combat light headedness   Person(s) Educated Patient   Methods Explanation   Comprehension Verbalized understanding;Returned demonstration;Need further instruction          SLP Short Term Goals - 02/06/17 1055      SLP SHORT TERM GOAL #1   Title pt will demo vocal adduction HEP with modified independence over two sessions   Status Achieved     SLP SHORT TERM GOAL #2   Title pt will achieve sustained /a/ of average 7.0 seconds   Time 1    Period Weeks   Status On-going     SLP SHORT TERM GOAL #3   Title pt will demo abdominal breathing in 5 minutes simple conversation   Time 1   Period Weeks   Status On-going          SLP Long Term Goals - 02/06/17 1055      SLP LONG TERM GOAL #1   Title pt will demo HEP for vocal adduction with modified independence over three sessions   Status Achieved     SLP LONG TERM GOAL #2   Title pt will demo abdominal breathing in 10 minutes simple conversation over two sessions   Baseline 01/31/17   Time 5   Period Weeks   Status On-going     SLP LONG TERM GOAL #3   Title pt will achieve 7.5 seconds average with sustained /a/    Time 5   Period Weeks   Status On-going     SLP LONG TERM GOAL #4   Title pt will achieve s/z ratio <3.4 over two sessions   Time 5   Period Weeks   Status On-going     SLP LONG TERM GOAL #5   Title pt will demo safe swallow strategies with modified independence over two sessions   Time 5   Period Weeks   Status On-going          Plan - 02/06/17 1053    Clinical Impression Statement Pt continues to exhibit breathy voice quality, however today less breathy than previous session and voice sounded much like session two visits ago. Pt practiced exercises for abdominal breathing (AB) independently at rest, and HEP for vocal fold adduction again completed without cues necessary, Pt used AB in sentence responses with independence howerver reproted to SLP minor light-headedness duringt this time. She confirmed she had this same thing occur at home practicing on Saturday. Pt would benefit from continued skilled ST services for improvement of vocal fold function, safe swallow, and effective communication..      Patient will benefit from skilled therapeutic intervention in order to improve the following deficits and impairments:   Other voice and resonance disorders (CODE)    Problem List Patient Active Problem List   Diagnosis Date Noted  .  Hypertension 01/09/2017  . Brain metastasis ([place]) 12/26/2016  . Bone metastasis ([place]) 12/26/2016  . Metastasis to adrenal gland ([place]) 12/26/2016  . Hypersensitivity reaction 12/22/2016  . Goals of care, counseling/discussion 12/17/2016  . Encounter for antineoplastic chemotherapy 12/17/2016  . Adenocarcinoma of left lung, stage 4 ([place]) 12/16/2016  . Liver metastasis ([place]) 11/18/2016  . Unilateral vocal cord paralysis 11/10/2016    [facility] ,[place], CCC-SLP  02/06/2017, 10:58 AM  [facility] [address] [place], [place], [address] Phone: [phone]   Fax:  [phone]   Name: LAMIKA CONNOLLY MRN: [id number] Date of Birth: 04-17-1951

## 2017-02-07 ENCOUNTER — Telehealth: Payer: Self-pay | Admitting: Medical Oncology

## 2017-02-07 ENCOUNTER — Encounter: Payer: Self-pay | Admitting: Internal Medicine

## 2017-02-07 NOTE — Telephone Encounter (Signed)
Pt needs 2 more prior auth for oxycodone. I faxed one 30 minutes ago . I left message for Pharmacist to return my call for next steps.

## 2017-02-07 NOTE — Telephone Encounter (Addendum)
Called [facility] and gave more clinical data. Hold up is related to monthly quantity of 540 tablets. ( 1-3 tablets every 4 hours). Going to pharmacy review.

## 2017-02-07 NOTE — Telephone Encounter (Signed)
Faxed PA form completed to [facility] for oxycodone.

## 2017-02-07 NOTE — Progress Notes (Signed)
Karen Vasquez 66 y.o. woman with Adenocarcinoma of the left lung, Stage IV ([place]), with liver and brain metastases  radiation completed 01-23-17 review 02-06-17 MRI brain w wo contrast one month FU.   Pain: None, had some abdominal cramping earlier this morning.  Denies headaches. Reports having blurred vision at times for a short period,dizziness,SOB while talking, dry coughing  Respiratory issues:None Fatigue:Yes most of the time due to nausea. Pain difficulty swallowing or feel like there is a lump in their chest feeling is gone.  No problems when she is eating or drinking.  Are they taking Carafate? 1 Gm qid not taking now Skin Irritation:None to chest Chemotherapy:Systemic chemotherapy with carboplatin for AUC of 5, Alimta 500 MG/M2 and Avastin 15 MG/KG every 3 weeks last dose 01-23-17 Weight:Eating poor because of the nausea a lot of the time taking Ativan as of today for the nausea, attempting to eat four to five small meals per day.  Feels she has a little memory issue bur nothing that is troubling.  She was able to answer all questions today without difficulty or problems with word finding. Wt Readings from Last 3 Encounters:  02/08/17 138 lb 3.2 oz (62.7 kg)  02/02/17 139 lb 12.8 oz (63.4 kg)  01/17/17 138 lb 6.4 oz (62.8 kg)   02-02-17 Saw Dr. Julien Nordmann She would come back for follow-up visit in 3 weeks for evaluation after repeating CT scan of the chest, abdomen and pelvis for restaging of her disease. BP 136/82   Pulse 99   Temp 98 F (36.7 C) (Oral)   Resp 16   Ht '5\' 4"'$  (1.626 m)   Wt 138 lb 3.2 oz (62.7 kg)   SpO2 96%   BMI 23.72 kg/m

## 2017-02-07 NOTE — Progress Notes (Signed)
[employer] approved another Oxycodone Tab '5mg'$ , auth #: [id number] through 02/07/18.

## 2017-02-07 NOTE — Telephone Encounter (Signed)
err

## 2017-02-07 NOTE — Progress Notes (Signed)
Received letter from pharmacy requesting auth for Oxycodone.  After calling her insurance to initiate the [place] there are 2 auths on file for this drug: [id number] from 01/06/17 - 01/06/18 and [id number] from 01/13/17 - 01/13/18.  Ins requested the pharmacy to call their help desk [phone]), so I called and relayed message to them.

## 2017-02-08 ENCOUNTER — Ambulatory Visit: Payer: BLUE CROSS/BLUE SHIELD

## 2017-02-08 ENCOUNTER — Telehealth: Payer: Self-pay | Admitting: *Deleted

## 2017-02-08 ENCOUNTER — Ambulatory Visit
Admission: RE | Admit: 2017-02-08 | Discharge: 2017-02-08 | Disposition: A | Discharge status: Home / Self Care | Payer: BLUE CROSS/BLUE SHIELD | Source: Ambulatory Visit | Attending: Radiation Oncology | Admitting: Radiation Oncology

## 2017-02-08 ENCOUNTER — Encounter: Payer: Self-pay | Admitting: Radiation Oncology

## 2017-02-08 ENCOUNTER — Other Ambulatory Visit (HOSPITAL_BASED_OUTPATIENT_CLINIC_OR_DEPARTMENT_OTHER): Payer: BLUE CROSS/BLUE SHIELD

## 2017-02-08 ENCOUNTER — Other Ambulatory Visit: Payer: Self-pay | Admitting: Medical Oncology

## 2017-02-08 VITALS — BP 136/82 | HR 99 | Temp 98.0°F | Resp 16 | Ht 64.0 in | Wt 138.2 lb

## 2017-02-08 DIAGNOSIS — Z79899 Other long term (current) drug therapy: Secondary | ICD-10-CM | POA: Diagnosis not present

## 2017-02-08 DIAGNOSIS — C787 Secondary malignant neoplasm of liver and intrahepatic bile duct: Secondary | ICD-10-CM | POA: Insufficient documentation

## 2017-02-08 DIAGNOSIS — R1313 Dysphagia, pharyngeal phase: Secondary | ICD-10-CM

## 2017-02-08 DIAGNOSIS — C3492 Malignant neoplasm of unspecified part of left bronchus or lung: Secondary | ICD-10-CM | POA: Insufficient documentation

## 2017-02-08 DIAGNOSIS — I10 Essential (primary) hypertension: Secondary | ICD-10-CM | POA: Diagnosis not present

## 2017-02-08 DIAGNOSIS — Z9889 Other specified postprocedural states: Secondary | ICD-10-CM | POA: Insufficient documentation

## 2017-02-08 DIAGNOSIS — Z803 Family history of malignant neoplasm of breast: Secondary | ICD-10-CM | POA: Insufficient documentation

## 2017-02-08 DIAGNOSIS — Z888 Allergy status to other drugs, medicaments and biological substances status: Secondary | ICD-10-CM | POA: Diagnosis not present

## 2017-02-08 DIAGNOSIS — J45909 Unspecified asthma, uncomplicated: Secondary | ICD-10-CM | POA: Insufficient documentation

## 2017-02-08 DIAGNOSIS — R498 Other voice and resonance disorders: Secondary | ICD-10-CM

## 2017-02-08 DIAGNOSIS — C7931 Secondary malignant neoplasm of brain: Secondary | ICD-10-CM | POA: Diagnosis not present

## 2017-02-08 DIAGNOSIS — Z923 Personal history of irradiation: Secondary | ICD-10-CM | POA: Insufficient documentation

## 2017-02-08 DIAGNOSIS — Z8489 Family history of other specified conditions: Secondary | ICD-10-CM | POA: Diagnosis not present

## 2017-02-08 DIAGNOSIS — K219 Gastro-esophageal reflux disease without esophagitis: Secondary | ICD-10-CM | POA: Diagnosis not present

## 2017-02-08 DIAGNOSIS — C7951 Secondary malignant neoplasm of bone: Secondary | ICD-10-CM | POA: Insufficient documentation

## 2017-02-08 DIAGNOSIS — Z90710 Acquired absence of both cervix and uterus: Secondary | ICD-10-CM | POA: Diagnosis not present

## 2017-02-08 DIAGNOSIS — Z8042 Family history of malignant neoplasm of prostate: Secondary | ICD-10-CM | POA: Insufficient documentation

## 2017-02-08 LAB — CBC WITH DIFFERENTIAL/PLATELET
BASO%: 0.9 % (ref 0.0–2.0)
BASOS ABS: 0 10*3/uL (ref 0.0–0.1)
EOS%: 18.5 % — AB (ref 0.0–7.0)
Eosinophils Absolute: 0.2 10*3/uL (ref 0.0–0.5)
HCT: 35.2 % (ref 34.8–46.6)
HGB: 11.9 g/dL (ref 11.6–15.9)
LYMPH%: 22.2 % (ref 14.0–49.7)
MCH: 29 pg (ref 25.1–34.0)
MCHC: 33.7 g/dL (ref 31.5–36.0)
MCV: 86.2 fL (ref 79.5–101.0)
MONO#: 0.1 10*3/uL (ref 0.1–0.9)
MONO%: 10.4 % (ref 0.0–14.0)
NEUT#: 0.6 10*3/uL — ABNORMAL LOW (ref 1.5–6.5)
NEUT%: 48 % (ref 38.4–76.8)
Platelets: 151 10*3/uL (ref 145–400)
RBC: 4.08 10*6/uL (ref 3.70–5.45)
RDW: 19.7 % — ABNORMAL HIGH (ref 11.2–14.5)
WBC: 1.3 10*3/uL — ABNORMAL LOW (ref 3.9–10.3)
lymph#: 0.3 10*3/uL — ABNORMAL LOW (ref 0.9–3.3)

## 2017-02-08 LAB — COMPREHENSIVE METABOLIC PANEL
ALT: 19 U/L (ref 0–55)
AST: 19 U/L (ref 5–34)
Albumin: 3 g/dL — ABNORMAL LOW (ref 3.5–5.0)
Alkaline Phosphatase: 93 U/L (ref 40–150)
Anion Gap: 11 mEq/L (ref 3–11)
BUN: 21 mg/dL (ref 7.0–26.0)
CHLORIDE: 104 meq/L (ref 98–109)
CO2: 23 mEq/L (ref 22–29)
CREATININE: 1.1 mg/dL (ref 0.6–1.1)
Calcium: 9 mg/dL (ref 8.4–10.4)
EGFR: 55 mL/min/{1.73_m2} — ABNORMAL LOW (ref >90)
Glucose: 109 mg/dl (ref 70–140)
POTASSIUM: 4.5 meq/L (ref 3.5–5.1)
SODIUM: 138 meq/L (ref 136–145)
Total Bilirubin: 0.48 mg/dL (ref 0.20–1.20)
Total Protein: 6.3 g/dL — ABNORMAL LOW (ref 6.4–8.3)

## 2017-02-08 NOTE — Telephone Encounter (Signed)
Her nausea predates taking oxycodone . Per Julien Nordmann I told pt to take ativan 0.5 mg tid prn for nausea. She says she has some from previous rx [facility] ) and has #20 left . I told her to call if she needs a refill.

## 2017-02-08 NOTE — Progress Notes (Signed)
Radiation Oncology         [phone]) [phone] ________________________________  Follow-up Visit Note  Name: Karen Vasquez MRN: [id number]  Date: 02/08/2017  DOB: 1951-04-02  WL:[id number] [name], MD  Curt Bears, MD   REFERRING PHYSICIAN: Curt Bears, MD  DIAGNOSIS: The encounter diagnosis was Metastasis to brain [facility]).  66 y.o. woman with adenocarcinoma of the left lung, stage IV ([place]), with liver and brain metastases  INTERVAL SINCE LAST RADIATION: 2 weeks 01/04/17-01/23/17: 35 Gy to the chest, 20 Gy to PTV 1-4, 20 Gy to sacrum  01/09/17: Karen Vasquez received stereotactic radiosurgery to the following targets: Four targets (PTV1 Rt Choroid 24 mm, PTV2 Lt Frontal 6 mm, PTV3 Rt Parietal 2 mm, PTV4 Lt Convexity 7 mm) were treated using 4 Dynamic Conformal Arcs to a prescription dose of 18-20 Gy.  ExacTrac registration was performed for each couch angle.  The 100% isodose line was prescribed.  6 MV X-rays were delivered in the flattening filter free beam mode.  NARRATIVE: Karen Vasquez is a 66 y.o. female seen for a routine follow-up. In summary, the patient was found to have a poorly defined 3.7 cm lesion in the mediastinal prevascular space and a 1.8 spiculated lesion in the anterior medial left upper lobe on a CT scan of the chest and neck on 11/01/16.  PET scan on 11/17/16 showed widespread malignancy with metastatic disease to the liver, bilateral adrenal glands, abdominal lymph nodes, and bony metastasis. She was seen by Dr. Julien Nordmann on 12/16/16, and was started on Carboplatin, Alimta, and Avastin on 12/22/16. The patient was seen in radiation oncology on 12/26/16. She completed palliative radiation to the chest, thoracic spine, and sacrum on 01/23/17.   The patient underwent Brain MRI on 12/20/2016. This scan showed a small intraparenchymal metastatic lesion within the left frontal lobe with very mild surrounding vasogenic edema, with no mass effect or hemorrhage. Also noted was  asymmetrically enlarged and contrast-enhancing right choroid plexus. In the context of known widespread metastatic disease, this is concerning for a metastasis. There was also a small left frontal convexity dural-based mass, most consistent with meningioma. However, in this scenario, a dural-based metastasis would be difficult to exclude.   Repeat MRI on 01/01/17 for [facility] planning purposes demonstrated a new 12 mm curvilinear enhancement in the posterior right cerebellum; indeterminate but felt to most resemble a subacute lacunar infarct.  She has completed SRS to the brain lesions on 01/09/17.  ROS: She reports some abdominal cramping this morning but denies other pain at this time. She reports blurred vision for short periods of time, shortness of breath when talking, and a dry cough. She reports fatigue associated with nausea. She also reports a poor appetite due to associated nausea. She manages the nausea with Ativan. She denies headaches, respiration issues, pain or difficulty swallowing, or skin irritation. Patient reports she has a little bit of memory issues that does not affect her daily life. Per nursing, she was able to answer all questions today without difficulty or problems with word finding.   PAST MEDICAL HISTORY:  Past Medical History:  Diagnosis Date  . Adenocarcinoma of left lung, stage 4 ([place]) 12/16/2016  . Arthritis   . Asthma due to seasonal allergies   . Encounter for antineoplastic chemotherapy 12/17/2016  . GERD (gastroesophageal reflux disease)   . Goals of care, counseling/discussion 12/17/2016  . Hernia of abdominal wall    umbilical  . Hypertension 01/09/2017  . No pertinent past medical history  COLD UPPER RESP STARTED ZPAK 6/14  . Recurrent upper respiratory infection (URI)    recent sinus inf  . Thyroid nodule   . Urine blood    hematuria      PAST SURGICAL HISTORY: Past Surgical History:  Procedure Laterality Date  . ABDOMINAL HYSTERECTOMY  08   vag,  rectocele repair , bladder sling  . ANTERIOR CERVICAL DECOMP/DISCECTOMY FUSION  05/09/2012   Procedure: ANTERIOR CERVICAL DECOMPRESSION/DISCECTOMY FUSION 1 LEVEL;  Surgeon: Eustace Moore, MD;  Location: [facility];  Service: Neurosurgery;  Laterality: N/A;  anterior cervical decompression fusion cervical five-six  . BREAST CYST EXCISION  96   fibroadenoma lft  . BREAST SURGERY     03  . CARPAL TUNNEL RELEASE  05   bil  . DILATION AND CURETTAGE OF UTERUS  03    polyps  . TUBAL LIGATION  88    FAMILY HISTORY:  Family History  Problem Relation Age of Onset  . Cancer Mother     breast  . Cancer Father     prostate  . Muscular dystrophy Son     SOCIAL HISTORY:  Social History   Social History  . Marital status: Married    Spouse name: N/A  . Number of children: N/A  . Years of education: N/A   Occupational History  . Not on file.   Social History Main Topics  . Smoking status: Never Smoker  . Smokeless tobacco: Never Used  . Alcohol use No  . Drug use: No  . Sexual activity: Not Currently   Other Topics Concern  . Not on file   Social History Narrative  . No narrative on file    ALLERGIES: Prednisone; Fenofibrate; Quinolones; Statins; and Septra [sulfamethoxazole-trimethoprim]  MEDICATIONS:  Current Outpatient Prescriptions  Medication Sig Dispense Refill  . Cholecalciferol (VITAMIN D) 2000 units tablet Take 2,000 Units by mouth daily.    Marland Kitchen conjugated estrogens (PREMARIN) vaginal cream Place 1 Applicatorful vaginally daily.    . Cranberry 500 MG TABS Take 1 tablet by mouth 2 (two) times daily.    Marland Kitchen ezetimibe (ZETIA) 10 MG tablet Take 10 mg by mouth daily.    . folic acid (FOLVITE) 1 MG tablet Take 1 tablet (1 mg total) by mouth daily. 30 tablet 4  . ketotifen (ZADITOR) 0.025 % ophthalmic solution Place 1 drop into both eyes 2 (two) times daily.    Marland Kitchen loratadine (CLARITIN) 10 MG tablet Take 10 mg by mouth daily.    Marland Kitchen LORazepam (ATIVAN) 0.5 MG tablet 1 tablet po  30 minutes prior to radiation or MRI 30 tablet 0  . Multiple Vitamin (MULTIVITAMIN) tablet Take 1 tablet by mouth daily.    Marland Kitchen oxyCODONE (OXY IR/ROXICODONE) 5 MG immediate release tablet Take 1-3 tablets (5-15 mg total) by mouth every 4 (four) hours as needed for severe pain. 100 tablet 0  . pantoprazole (PROTONIX) 40 MG tablet Take 40 mg by mouth 2 (two) times daily.     . raloxifene (EVISTA) 60 MG tablet Take 60 mg by mouth daily.    . sodium chloride (OCEAN) 0.65 % nasal spray Place 1 spray into the nose daily as needed for congestion.     Marland Kitchen trimethoprim (TRIMPEX) 100 MG tablet Take 100 mg by mouth daily.     Marland Kitchen OLANZapine (ZYPREXA) 10 MG tablet Take 1 tablet (10 mg total) by mouth at bedtime. For Nausea (Patient not taking: Reported on 02/08/2017) 30 tablet 1  . ondansetron (ZOFRAN) 8 MG  tablet Take 1 tablet (8 mg total) by mouth every 8 (eight) hours as needed for nausea or vomiting. (Patient not taking: Reported on 02/08/2017) 30 tablet 0  . prochlorperazine (COMPAZINE) 10 MG tablet TAKE ONE TABLET BY MOUTH EVERY 6 HOURS AS NEEDED FOR NAUSEA/VOMITING (Patient not taking: Reported on 02/08/2017) 30 tablet 0  . sucralfate (CARAFATE) 1 g tablet Take 1 tablet (1 g total) by mouth 4 (four) times daily -  with meals and at bedtime. 5 minutes before food (Patient not taking: Reported on 02/08/2017) 120 tablet 5   No current facility-administered medications for this encounter.    PHYSICAL EXAM:  Wt Readings from Last 3 Encounters:  02/08/17 138 lb 3.2 oz (62.7 kg)  02/02/17 139 lb 12.8 oz (63.4 kg)  01/17/17 138 lb 6.4 oz (62.8 kg)   Temp Readings from Last 3 Encounters:  02/08/17 98 F (36.7 C) (Oral)  02/02/17 99.2 F (37.3 C) (Oral)  01/17/17 97.6 F (36.4 C) (Oral)   BP Readings from Last 3 Encounters:  02/08/17 136/82  02/02/17 111/70  02/02/17 (!) 145/76   Pulse Readings from Last 3 Encounters:  02/08/17 99  02/02/17 79  02/02/17 97   Pain Assessment Pain Score: 0-No  pain/10  In general this is a well appearing caucasian female in no acute distress. She is alert and oriented x4 and appropriate throughout the examination. Cardiopulmonary assessment is negative for acute distress and she exhibits normal effort.   LABORATORY DATA:  Lab Results  Component Value Date   WBC 1.3 (L) 02/08/2017   HGB 11.9 02/08/2017   HCT 35.2 02/08/2017   MCV 86.2 02/08/2017   PLT 151 02/08/2017   Lab Results  Component Value Date   NA 138 02/08/2017   K 4.5 02/08/2017   CL 101 05/01/2012   CO2 23 02/08/2017   Lab Results  Component Value Date   ALT 19 02/08/2017   AST 19 02/08/2017   ALKPHOS 93 02/08/2017   BILITOT 0.48 02/08/2017     RADIOGRAPHY: Mr Jeri Cos EG Contrast  Result Date: 02/06/2017 CLINICAL DATA:  Continued surveillance metastatic stage IV lung cancer. Recent SRS 01/09/2017 to 4 targets, 18 Gy for the right choroid plexus target and 20 Gy for the other 3 left convexity, left frontal, right parietal targets. EXAM: MRI HEAD WITHOUT AND WITH CONTRAST TECHNIQUE: Multiplanar, multiecho pulse sequences of the brain and surrounding structures were obtained without and with intravenous contrast. CONTRAST:  51m MULTIHANCE GADOBENATE DIMEGLUMINE 529 MG/ML IV SOLN COMPARISON:  None. FINDINGS: Brain: RIGHT choroid plexus mass decreased in size, estimated 50%. Decreased size, enhancement, and restriction involving the LEFT posterior frontal metastasis, image 108 series 11. Decreased size and enhancement of the RIGHT parietal metastasis, image 118 series 11. Slight decreased size in the dural-based convexity mass, 7 versus 9 mm, metastasis versus meningioma, as seen on coronal postcontrast imaging, reference image 29 series 12. Vasogenic edema surrounding the LEFT frontal metastasis is improved. No current areas of restricted diffusion either in the cerebral hemispheres or in the cerebellum. 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 Stable cerebral volume. Mild white matter disease, unchanged. No new lesions are identified. Vascular: Normal flow voids. Skull and upper cervical spine: Normal marrow signal. Stable RIGHT frontal hemangioma. Previous C5-6 ACDF. Sinuses/Orbits: Negative. Other: None. IMPRESSION: Overall improved appearance status post multitarget SRS. All lesions have shown favorable response. Electronically Signed   By: JStaci RighterM.D.   On:  02/06/2017 14:51      IMPRESSION/PLAN: 1. 66 y.o. woman with adenocarcinoma of the left lung, stage IV ([place]), with liver and brain metastases.  She has and appointment with Dr. Julien Nordmann on 02/23/17 to review follow up CT C/A/P for continued surveillance of her stage IV lung cancer. She will continue close follow up and we will follow her through correspondence with medical oncology. We are happy to continue to participate in her care and management as needed.  She knows to call with any questions or concerns related to her radiotherapy. 2. Brain mets. We reviewed her recent post-treatment MRI which shows resolution of the suspected area of infarct on previous MRI and improvement of all treated lesions.  No new lesions noted.  We will repeat MRI brain in 3 months with follow up to review results.   Nicholos Johns, PA-C

## 2017-02-08 NOTE — Telephone Encounter (Signed)
Received call from pt stating that she needs something different for nausea.  She reports that not taking anything is as good as taking all three of the nausea meds she has. She has compazine, zofran d& olanzapine.  She states no vomiting but constantly feels like it & scared to eat.  She is taking miralax daily & bowels are moving.  Last BM yest.  She has an appt for lab & seeing Dr Tammi Klippel today.  10:15/11:15.  Call back # is [phone]  Message to Dr Mohamed/Pod.

## 2017-02-09 ENCOUNTER — Other Ambulatory Visit: Payer: BLUE CROSS/BLUE SHIELD

## 2017-02-09 ENCOUNTER — Other Ambulatory Visit: Payer: Self-pay | Admitting: Urology

## 2017-02-09 NOTE — Therapy (Signed)
[facility] [address] [place], [place], [address] Phone: [phone]   Fax:  [phone]  Speech Language Pathology Treatment  Patient Details  Name: Karen Vasquez MRN: [id number] Date of Birth: [birth year] Referring Provider: Shona Simpson, PA-C  Encounter Date: 02/08/2017      End of Session - 02/09/17 1031    Visit Number 7   Number of Visits 17   Date for SLP Re-Evaluation 03/10/17   SLP Start Time 0806   SLP Stop Time  0846   SLP Time Calculation (min) 40 min   Activity Tolerance Patient tolerated treatment well      Past Medical History:  Diagnosis Date  . Adenocarcinoma of left lung, stage 4 ([place]) 12/16/2016  . Arthritis   . Asthma due to seasonal allergies   . Encounter for antineoplastic chemotherapy 12/17/2016  . GERD (gastroesophageal reflux disease)   . Goals of care, counseling/discussion 12/17/2016  . Hernia of abdominal wall    umbilical  . Hypertension 01/09/2017  . No pertinent past medical history    COLD UPPER RESP STARTED ZPAK 6/14  . Recurrent upper respiratory infection (URI)    recent sinus inf  . Thyroid nodule   . Urine blood    hematuria    Past Surgical History:  Procedure Laterality Date  . ABDOMINAL HYSTERECTOMY  08   vag, rectocele repair , bladder sling  . ANTERIOR CERVICAL DECOMP/DISCECTOMY FUSION  05/09/2012   Procedure: ANTERIOR CERVICAL DECOMPRESSION/DISCECTOMY FUSION 1 LEVEL;  Surgeon: Eustace Moore, MD;  Location: [facility];  Service: Neurosurgery;  Laterality: N/A;  anterior cervical decompression fusion cervical five-six  . BREAST CYST EXCISION  96   fibroadenoma lft  . BREAST SURGERY     03  . CARPAL TUNNEL RELEASE  05   bil  . DILATION AND CURETTAGE OF UTERUS  03    polyps  . TUBAL LIGATION  88    There were no vitals filed for this visit.      Subjective Assessment - 02/08/17 0826    Subjective "I'm feeling woozy this morning - I just feel sick."   Currently in Pain? Yes   Pain Score 3    Pain Location Abdomen   Pain Orientation Mid   Pain Descriptors / Indicators Cramping   Pain Type Acute pain   Pain Onset Yesterday   Pain Frequency Intermittent   Aggravating Factors  nothing   Pain Relieving Factors defecation   Effect of Pain on Daily Activities all activities               ADULT SLP TREATMENT - 02/09/17 0001      General Information   Behavior/Cognition Alert;Cooperative;Pleasant mood     Treatment Provided   Treatment provided Cognitive-Linquistic     Dysphagia Treatment   Other treatment/comments pt nauseous, politely declined POs today.     Cognitive-Linquistic Treatment   Treatment focused on Voice   Skilled Treatment SLP focused on abdominal breathing (AB) today in order for pt to be most effecient with breath during speech production. Pt utilized AB in multisentence and conversational contexts today in session with average 80% success. Pt agrees it is appropriate to decr to x1/week.     Assessment / Recommendations / Plan   Plan --  reduce frequency to x1/week     Progression Toward Goals   Progression toward goals Progressing toward goals            SLP Short Term  Goals - 02/08/17 1243      SLP SHORT TERM GOAL #1   Title pt will demo vocal adduction HEP with modified independence over two sessions   Status Achieved     SLP SHORT TERM GOAL #2   Title pt will achieve sustained /a/ of average 7.0 seconds   Status Not Met     SLP SHORT TERM GOAL #3   Title pt will demo abdominal breathing in 5 minutes simple conversation   Status Achieved          SLP Long Term Goals - 02/09/17 1034      SLP LONG TERM GOAL #1   Title pt will demo HEP for vocal adduction with modified independence over three sessions   Status Achieved     SLP LONG TERM GOAL #2   Title pt will demo abdominal breathing in 10 minutes simple conversation over two sessions   Status Achieved     SLP LONG TERM GOAL #3    Title pt will achieve 7.5 seconds average with sustained /a/    Time 5   Period Weeks   Status On-going     SLP LONG TERM GOAL #4   Title pt will achieve s/z ratio <3.4 over two sessions   Time 5   Period Weeks   Status On-going     SLP LONG TERM GOAL #5   Title pt will demo safe swallow strategies with modified independence over two sessions   Time 5   Period Weeks   Status On-going          Plan - 02/09/17 1031    Clinical Impression Statement Pt continues to exhibit high pitch, and breathy voice quality with aphonia. Pt engaged in targeted abdominal breathing (AB) practice with multisentence and conversational tasks (8-10 minutes) without cues needed. Pt would benefit from continued skilled ST services at once/week for improvement of vocal fold function, safe swallow, and effective communication.Marland Kitchen   Speech Therapy Frequency 1x /week   Duration --  8 weeks   Treatment/Interventions Aspiration precaution training;Pharyngeal strengthening exercises;Diet toleration management by SLP;Compensatory techniques;Internal/external aids;Multimodal communcation approach;SLP instruction and feedback;Patient/family education;Functional tasks   Potential to Achieve Goals Fair   Potential Considerations Severity of impairments;Co-morbidities;Ability to learn/carryover information;Family/community support;Cooperation/participation level   SLP Home Exercise Plan reviewed, provided additional   Consulted and Agree with Plan of Care Patient      Patient will benefit from skilled therapeutic intervention in order to improve the following deficits and impairments:   Other voice and resonance disorders (CODE)  Dysphagia, pharyngeal    Problem List Patient Active Problem List   Diagnosis Date Noted  . Hypertension 01/09/2017  . Brain metastasis ([place]) 12/26/2016  . Bone metastasis ([place]) 12/26/2016  . 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 Hypersensitivity reaction 12/22/2016  .  Goals of care, counseling/discussion 12/17/2016  . Encounter for antineoplastic chemotherapy 12/17/2016  . Adenocarcinoma of left lung, stage 4 ([place]) 12/16/2016  . Liver metastasis ([place]) 11/18/2016  . Unilateral vocal cord paralysis 11/10/2016    [facility] ,[place], [place]  02/09/2017, 10:36 AM  [facility] [address] [place], [place], [address] Phone: [phone]   Fax:  [phone]   Name: Karen Vasquez MRN: [id number] Date of Birth: 12-06-1950

## 2017-02-16 ENCOUNTER — Ambulatory Visit: Payer: BLUE CROSS/BLUE SHIELD | Attending: Radiation Oncology

## 2017-02-16 ENCOUNTER — Other Ambulatory Visit: Payer: Self-pay | Admitting: Medical Oncology

## 2017-02-16 ENCOUNTER — Other Ambulatory Visit (HOSPITAL_BASED_OUTPATIENT_CLINIC_OR_DEPARTMENT_OTHER): Payer: BLUE CROSS/BLUE SHIELD

## 2017-02-16 DIAGNOSIS — R498 Other voice and resonance disorders: Secondary | ICD-10-CM

## 2017-02-16 DIAGNOSIS — C3492 Malignant neoplasm of unspecified part of left bronchus or lung: Secondary | ICD-10-CM | POA: Diagnosis not present

## 2017-02-16 DIAGNOSIS — R1313 Dysphagia, pharyngeal phase: Secondary | ICD-10-CM

## 2017-02-16 DIAGNOSIS — R110 Nausea: Secondary | ICD-10-CM

## 2017-02-16 LAB — COMPREHENSIVE METABOLIC PANEL
ALBUMIN: 3.2 g/dL — AB (ref 3.5–5.0)
ALT: 15 U/L (ref 0–55)
AST: 20 U/L (ref 5–34)
Alkaline Phosphatase: 99 U/L (ref 40–150)
Anion Gap: 10 mEq/L (ref 3–11)
BUN: 13.5 mg/dL (ref 7.0–26.0)
CHLORIDE: 105 meq/L (ref 98–109)
CO2: 23 mEq/L (ref 22–29)
Calcium: 9.1 mg/dL (ref 8.4–10.4)
Creatinine: 1 mg/dL (ref 0.6–1.1)
EGFR: 62 mL/min/{1.73_m2} — ABNORMAL LOW (ref >90)
GLUCOSE: 96 mg/dL (ref 70–140)
POTASSIUM: 3.7 meq/L (ref 3.5–5.1)
SODIUM: 139 meq/L (ref 136–145)
Total Bilirubin: 0.33 mg/dL (ref 0.20–1.20)
Total Protein: 6.7 g/dL (ref 6.4–8.3)

## 2017-02-16 LAB — UA PROTEIN, DIPSTICK - CHCC: Protein, ur: <30 mg/dL

## 2017-02-16 LAB — CBC WITH DIFFERENTIAL/PLATELET
BASO%: 0 % (ref 0.0–2.0)
BASOS ABS: 0 10*3/uL (ref 0.0–0.1)
EOS%: 7.2 % — AB (ref 0.0–7.0)
Eosinophils Absolute: 0.2 10*3/uL (ref 0.0–0.5)
HCT: 29.6 % — ABNORMAL LOW (ref 34.8–46.6)
HEMOGLOBIN: 10 g/dL — AB (ref 11.6–15.9)
LYMPH#: 0.6 10*3/uL — AB (ref 0.9–3.3)
LYMPH%: 27.2 % (ref 14.0–49.7)
MCH: 29.3 pg (ref 25.1–34.0)
MCHC: 33.8 g/dL (ref 31.5–36.0)
MCV: 86.8 fL (ref 79.5–101.0)
MONO#: 0.7 10*3/uL (ref 0.1–0.9)
MONO%: 31.1 % — ABNORMAL HIGH (ref 0.0–14.0)
NEUT#: 0.8 10*3/uL — ABNORMAL LOW (ref 1.5–6.5)
NEUT%: 34.5 % — ABNORMAL LOW (ref 38.4–76.8)
Platelets: 102 10*3/uL — ABNORMAL LOW (ref 145–400)
RBC: 3.41 10*6/uL — ABNORMAL LOW (ref 3.70–5.45)
RDW: 19.4 % — AB (ref 11.2–14.5)
WBC: 2.4 10*3/uL — AB (ref 3.9–10.3)

## 2017-02-16 MED ORDER — LORAZEPAM 0.5 MG PO TABS: 0.5000 mg | ORAL_TABLET | Freq: Three times a day (TID) | ORAL | 0 refills | Status: AC

## 2017-02-16 NOTE — Therapy (Signed)
[facility] [address] [place], [place], [address] Phone: [phone]   Fax:  [phone]  Speech Language Pathology Treatment  Patient Details  Name: Karen Vasquez MRN: [id number] Date of Birth: 03/13/1951 Referring Provider: Shona Simpson, PA-C  Encounter Date: 02/16/2017      End of Session - 02/16/17 0939    Visit Number 8   Number of Visits 17   Date for SLP Re-Evaluation 03/10/17   SLP Start Time 0850   SLP Stop Time  0931   SLP Time Calculation (min) 41 min   Activity Tolerance Patient tolerated treatment well      Past Medical History:  Diagnosis Date  . Adenocarcinoma of left lung, stage 4 ([place]) 12/16/2016  . Arthritis   . Asthma due to seasonal allergies   . Encounter for antineoplastic chemotherapy 12/17/2016  . GERD (gastroesophageal reflux disease)   . Goals of care, counseling/discussion 12/17/2016  . Hernia of abdominal wall    umbilical  . Hypertension 01/09/2017  . No pertinent past medical history    COLD UPPER RESP STARTED [facility] 6/14  . Recurrent upper respiratory infection (URI)    recent sinus inf  . Thyroid nodule   . Urine blood    hematuria    Past Surgical History:  Procedure Laterality Date  . ABDOMINAL HYSTERECTOMY  08   vag, rectocele repair , bladder sling  . ANTERIOR CERVICAL DECOMP/DISCECTOMY FUSION  05/09/2012   Procedure: ANTERIOR CERVICAL DECOMPRESSION/DISCECTOMY FUSION 1 LEVEL;  Surgeon: Eustace Moore, MD;  Location: [facility];  Service: Neurosurgery;  Laterality: N/A;  anterior cervical decompression fusion cervical five-six  . BREAST CYST EXCISION  96   fibroadenoma lft  . BREAST SURGERY     03  . CARPAL TUNNEL RELEASE  05   bil  . DILATION AND CURETTAGE OF UTERUS  03    polyps  . TUBAL LIGATION  88    There were no vitals filed for this visit.      Subjective Assessment - 02/16/17 0900    Subjective "I'm feeling nauseous but not as bad as last time." Pt  feels comfortable with today as last session.   Currently in Pain? Yes   Pain Score 3    Pain Location Abdomen   Pain Orientation Mid   Pain Descriptors / Indicators Constant;Discomfort   Pain Type Chronic pain   Pain Onset 1 to 4 weeks ago   Pain Frequency Intermittent   Aggravating Factors  nothing   Pain Relieving Factors meds               ADULT SLP TREATMENT - 02/16/17 0902      General Information   Behavior/Cognition Alert;Cooperative;Pleasant mood     Treatment Provided   Treatment provided Cognitive-Linquistic     Dysphagia Treatment   Other treatment/comments SLP had to cue pt for precautions of small bites/sips, although based upon SLP assessment  pt was already taking small bites prior to modifed (MBSS). SLP asked pt probing quesitons about mixed consistencies and pt denied difficulty wiht those items.  s/z ratio and sustained /a/ times were mostly unchnaged from evaluation.     Cognitive-Linquistic Treatment   Treatment focused on Voice   Skilled Treatment Pt exhibited high pitched, max hoarse and breathy voice today with intermittent aphonia. Her abdominal breathing (AB) occurred 90% of the time in conversation , however at other times during the session was not as high. SLP encouraged pt to cont  with HEP as well as focus for 60 minutes on AB once, at least, each day.  Digital manipulation of thyroid was mildly successful at improving vocal quality.     Assessment / Recommendations / Plan   Plan Discharge SLP treatment due to (comment)  pt request     Progression Toward Goals   Progression toward goals --  see goal update - d/c          SLP Education - 02/16/17 [phone]    Education provided Yes   Education Details digital manipulation of thyroid/larynx to improve quality of voicing   Person(s) Educated Patient   Methods Explanation;Demonstration   Comprehension Verbalized understanding;Returned demonstration          SLP Short Term Goals -  02/08/17 1243      SLP SHORT TERM GOAL #1   Title pt will demo vocal adduction HEP with modified independence over two sessions   Status Achieved     SLP SHORT TERM GOAL #2   Title pt will achieve sustained /a/ of average 7.0 seconds   Status Not Met     SLP SHORT TERM GOAL #3   Title pt will demo abdominal breathing in 5 minutes simple conversation   Status Achieved          SLP Long Term Goals - 02/16/17 6387      SLP LONG TERM GOAL #1   Title pt will demo HEP for vocal adduction with modified independence over three sessions   Status Achieved     SLP LONG TERM GOAL #2   Title pt will demo abdominal breathing in 10 minutes simple conversation over two sessions   Status Achieved     SLP LONG TERM GOAL #3   Title pt will achieve 7.5 seconds average with sustained /a/    Status Not Met     SLP LONG TERM GOAL #4   Title pt will achieve s/z ratio <3.4 over two sessions   Status Not Met     SLP LONG TERM GOAL #5   Title pt will demo safe swallow strategies with modified independence over two sessions   Status Not Met  with intial min cues, for one session          Plan - 02/16/17 0939    Clinical Impression Statement Pt continues to exhibit high pitch, and breathy voice quality with aphonia. Pt engaged in targeted abdominal breathing (AB) practice in a conversational tasks (10 minutes) without cues needed, however without focus on AB, AB did not occur with same frequency. Pt followed swallow precautions with initial min cues. Pt requested d/c today as she found out she does not have time right now to complete HEP like she knows she should, and she feels she has what she needs to be successful.   Treatment/Interventions Aspiration precaution training;Pharyngeal strengthening exercises;Diet toleration management by SLP;Compensatory techniques;Internal/external aids;Multimodal communcation approach;SLP instruction and feedback;Patient/family education;Functional tasks    Potential to Achieve Goals Fair   Potential Considerations Severity of impairments;Co-morbidities;Ability to learn/carryover information;Family/community support;Cooperation/participation level   SLP Home Exercise Plan reviewed, provided additional   Consulted and Agree with Plan of Care Patient      Patient will benefit from skilled therapeutic intervention in order to improve the following deficits and impairments:   Other voice and resonance disorders (CODE)  Dysphagia, pharyngeal   SPEECH THERAPY DISCHARGE SUMMARY  Visits from Start of Care: 8  Current functional level related to goals / functional outcomes: Pt made gains as  seen above in goal section. Her vocal quality remains largely unchanged from evaluation measures. 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 SLP would have liked to have kept pt another 1-2 sessions to ensure consistency with abdominal breathing, however pt requested d/c during today's session.   Remaining deficits: Max hoarseness/breathiness with intermittent aphonia, mild dysphagia.   Education / Equipment: HEP, swallow precautions, digital manipulation of thyroid/larynx  Plan: Patient agrees to discharge.  Patient goals were partially met. Patient is being discharged due to the patient's request.  ?????       Problem List Patient Active Problem List   Diagnosis Date Noted  . Hypertension 01/09/2017  . Brain metastasis ([facility]) 12/26/2016  . Bone metastasis ([place]) 12/26/2016  . Metastasis to adrenal gland ([facility] [place]) 12/26/2016  . Hypersensitivity reaction 12/22/2016  . Goals of care, counseling/discussion 12/17/2016  . Encounter for antineoplastic chemotherapy 12/17/2016  . Adenocarcinoma of left lung, stage 4 ([place]) 12/16/2016  . Liver metastasis ([place]) 11/18/2016  . Unilateral vocal cord paralysis 11/10/2016    [facility] [address], [place]  02/16/2017, 9:44 AM  [facility] [address] [place], [place], [address] Phone: [phone]   Fax:  [phone]   Name: Karen Vasquez MRN: [id number] Date of Birth: 30-Oct-1951

## 2017-02-16 NOTE — Patient Instructions (Signed)
Continue to do exercises. Focus on abdominal breathing for 60 minutes when you know you will talk with someone, at least once a day.

## 2017-02-21 ENCOUNTER — Encounter (HOSPITAL_COMMUNITY): Payer: Self-pay

## 2017-02-21 ENCOUNTER — Ambulatory Visit (HOSPITAL_COMMUNITY)
Admission: RE | Admit: 2017-02-21 | Discharge: 2017-02-21 | Disposition: A | Discharge status: Home / Self Care | Payer: BLUE CROSS/BLUE SHIELD | Source: Ambulatory Visit | Attending: Internal Medicine | Admitting: Internal Medicine

## 2017-02-21 DIAGNOSIS — I313 Pericardial effusion (noninflammatory): Secondary | ICD-10-CM | POA: Diagnosis not present

## 2017-02-21 DIAGNOSIS — C3412 Malignant neoplasm of upper lobe, left bronchus or lung: Secondary | ICD-10-CM | POA: Diagnosis not present

## 2017-02-21 DIAGNOSIS — R590 Localized enlarged lymph nodes: Secondary | ICD-10-CM | POA: Diagnosis not present

## 2017-02-21 DIAGNOSIS — R918 Other nonspecific abnormal finding of lung field: Secondary | ICD-10-CM | POA: Insufficient documentation

## 2017-02-21 DIAGNOSIS — C7931 Secondary malignant neoplasm of brain: Secondary | ICD-10-CM | POA: Insufficient documentation

## 2017-02-21 DIAGNOSIS — I700 Atherosclerosis of aorta: Secondary | ICD-10-CM | POA: Diagnosis not present

## 2017-02-21 DIAGNOSIS — M4854XA Collapsed vertebra, not elsewhere classified, thoracic region, initial encounter for fracture: Secondary | ICD-10-CM | POA: Insufficient documentation

## 2017-02-21 DIAGNOSIS — C7951 Secondary malignant neoplasm of bone: Secondary | ICD-10-CM | POA: Insufficient documentation

## 2017-02-21 DIAGNOSIS — C787 Secondary malignant neoplasm of liver and intrahepatic bile duct: Secondary | ICD-10-CM | POA: Diagnosis not present

## 2017-02-21 DIAGNOSIS — N210 Calculus in bladder: Secondary | ICD-10-CM | POA: Diagnosis not present

## 2017-02-21 DIAGNOSIS — C3492 Malignant neoplasm of unspecified part of left bronchus or lung: Secondary | ICD-10-CM | POA: Diagnosis not present

## 2017-02-21 DIAGNOSIS — Z5111 Encounter for antineoplastic chemotherapy: Secondary | ICD-10-CM | POA: Diagnosis not present

## 2017-02-21 DIAGNOSIS — C7970 Secondary malignant neoplasm of unspecified adrenal gland: Secondary | ICD-10-CM | POA: Diagnosis not present

## 2017-02-21 MED ORDER — IOPAMIDOL (ISOVUE-300) INJECTION 61%
100.0000 mL | Freq: Once | INTRAVENOUS | Status: AC | PRN
  Administered 2017-02-21: 100 mL via INTRAVENOUS

## 2017-02-21 MED ORDER — IOPAMIDOL (ISOVUE-300) INJECTION 61%
INTRAVENOUS | Status: AC
  Filled 2017-02-21: qty 100

## 2017-02-23 ENCOUNTER — Telehealth: Payer: Self-pay | Admitting: Internal Medicine

## 2017-02-23 ENCOUNTER — Ambulatory Visit (HOSPITAL_BASED_OUTPATIENT_CLINIC_OR_DEPARTMENT_OTHER): Payer: BLUE CROSS/BLUE SHIELD | Admitting: Internal Medicine

## 2017-02-23 ENCOUNTER — Encounter: Payer: Self-pay | Admitting: Internal Medicine

## 2017-02-23 ENCOUNTER — Ambulatory Visit: Payer: BLUE CROSS/BLUE SHIELD

## 2017-02-23 ENCOUNTER — Other Ambulatory Visit (HOSPITAL_BASED_OUTPATIENT_CLINIC_OR_DEPARTMENT_OTHER): Payer: BLUE CROSS/BLUE SHIELD

## 2017-02-23 VITALS — BP 135/61 | HR 96 | Temp 99.2°F | Resp 18 | Ht 64.0 in | Wt 137.6 lb

## 2017-02-23 DIAGNOSIS — J329 Chronic sinusitis, unspecified: Secondary | ICD-10-CM | POA: Diagnosis not present

## 2017-02-23 DIAGNOSIS — C3412 Malignant neoplasm of upper lobe, left bronchus or lung: Secondary | ICD-10-CM

## 2017-02-23 DIAGNOSIS — C3492 Malignant neoplasm of unspecified part of left bronchus or lung: Secondary | ICD-10-CM

## 2017-02-23 DIAGNOSIS — R5382 Chronic fatigue, unspecified: Secondary | ICD-10-CM

## 2017-02-23 DIAGNOSIS — C7951 Secondary malignant neoplasm of bone: Secondary | ICD-10-CM

## 2017-02-23 DIAGNOSIS — Z7189 Other specified counseling: Secondary | ICD-10-CM

## 2017-02-23 DIAGNOSIS — C787 Secondary malignant neoplasm of liver and intrahepatic bile duct: Secondary | ICD-10-CM

## 2017-02-23 DIAGNOSIS — F419 Anxiety disorder, unspecified: Secondary | ICD-10-CM

## 2017-02-23 DIAGNOSIS — Z5112 Encounter for antineoplastic immunotherapy: Secondary | ICD-10-CM

## 2017-02-23 HISTORY — DX: Chronic fatigue, unspecified: R53.82

## 2017-02-23 LAB — CBC WITH DIFFERENTIAL/PLATELET
BASO%: 0.5 % (ref 0.0–2.0)
Basophils Absolute: 0 10*3/uL (ref 0.0–0.1)
EOS%: 1.1 % (ref 0.0–7.0)
Eosinophils Absolute: 0.1 10*3/uL (ref 0.0–0.5)
HEMATOCRIT: 29.2 % — AB (ref 34.8–46.6)
HGB: 10.1 g/dL — ABNORMAL LOW (ref 11.6–15.9)
LYMPH#: 0.8 10*3/uL — AB (ref 0.9–3.3)
LYMPH%: 12.8 % — ABNORMAL LOW (ref 14.0–49.7)
MCH: 30.7 pg (ref 25.1–34.0)
MCHC: 34.6 g/dL (ref 31.5–36.0)
MCV: 88.9 fL (ref 79.5–101.0)
MONO#: 1 10*3/uL — AB (ref 0.1–0.9)
MONO%: 17.2 % — ABNORMAL HIGH (ref 0.0–14.0)
NEUT%: 68.4 % (ref 38.4–76.8)
NEUTROS ABS: 4.1 10*3/uL (ref 1.5–6.5)
PLATELETS: 230 10*3/uL (ref 145–400)
RBC: 3.28 10*6/uL — ABNORMAL LOW (ref 3.70–5.45)
RDW: 24.1 % — AB (ref 11.2–14.5)
WBC: 5.9 10*3/uL (ref 3.9–10.3)

## 2017-02-23 LAB — COMPREHENSIVE METABOLIC PANEL
ALK PHOS: 100 U/L (ref 40–150)
ALT: 15 U/L (ref 0–55)
ANION GAP: 11 meq/L (ref 3–11)
AST: 17 U/L (ref 5–34)
Albumin: 3.1 g/dL — ABNORMAL LOW (ref 3.5–5.0)
BILIRUBIN TOTAL: 0.3 mg/dL (ref 0.20–1.20)
BUN: 14.4 mg/dL (ref 7.0–26.0)
CALCIUM: 8.8 mg/dL (ref 8.4–10.4)
CO2: 22 mEq/L (ref 22–29)
Chloride: 109 mEq/L (ref 98–109)
Creatinine: 1.2 mg/dL — ABNORMAL HIGH (ref 0.6–1.1)
EGFR: 46 mL/min/{1.73_m2} — AB (ref >90)
GLUCOSE: 123 mg/dL (ref 70–140)
POTASSIUM: 4.1 meq/L (ref 3.5–5.1)
Sodium: 142 mEq/L (ref 136–145)
TOTAL PROTEIN: 6.6 g/dL (ref 6.4–8.3)

## 2017-02-23 MED ORDER — AZITHROMYCIN 250 MG PO TABS: ORAL_TABLET | ORAL | 0 refills | Status: DC

## 2017-02-23 MED ORDER — LORAZEPAM 0.5 MG PO TABS: ORAL_TABLET | ORAL | 0 refills | Status: AC

## 2017-02-23 NOTE — Progress Notes (Signed)
DISCONTINUE ON PATHWAY REGIMEN - Non-Small Cell Lung     A cycle is every 21 days:     Carboplatin        Dose Mod: None     Pemetrexed        Dose Mod: None     Bevacizumab        Dose Mod: None  **Always confirm dose/schedule in your pharmacy ordering system**    REASON: Disease Progression PRIOR TREATMENT: [id number]: Carboplatin AUC=5 + Pemetrexed 500 mg/m2 + Bevacizumab 15 mg/kg q21 Days x 4 Cycles TREATMENT RESPONSE: Progressive Disease (PD)  START ON PATHWAY REGIMEN - Non-Small Cell Lung     A cycle is 21 days:     Pembrolizumab   **Always confirm dose/schedule in your pharmacy ordering system**    Patient Characteristics: Stage IV Metastatic, Non Squamous, Second Line - Chemotherapy/Immunotherapy, PS = 0, 1, No Prior PD-1/PD-L1  Inhibitor and Immunotherapy Candidate AJCC T Category: T1b Current Disease Status: Distant Metastases AJCC N Category: N2 AJCC M Category: M1c AJCC 8 Stage Grouping: IVB Histology: Non Squamous Cell ROS1 Rearrangement Status: Awaiting Test Results [id number] Mutation Status: Not Applicable - EGFR Mutation Negative/Unknown Other Mutations/Biomarkers: No Other Actionable Mutations PD-L1 Expression Status: PD-L1 Positive >= 50% (TPS) Chemotherapy/Immunotherapy LOT: Second Line Chemotherapy/Immunotherapy Molecular Targeted Therapy: Not Appropriate ALK Translocation Status: Awaiting Test Results Would you be surprised if this patient died  in the next year? I would NOT be surprised if this patient died in the next year EGFR Mutation Status: Awaiting Test Results BRAF V600E Mutation Status: Awaiting Test Results Performance Status: PS = 0, 1 Immunotherapy Candidate Status: Candidate for Immunotherapy Prior Immunotherapy Status: No Prior PD-1/PD-L1 Inhibitor  Intent of Therapy: Non-Curative / Palliative Intent, Discussed with Patient

## 2017-02-23 NOTE — Telephone Encounter (Signed)
Gave patient AVS and calender per 4/12 los.

## 2017-02-23 NOTE — Progress Notes (Signed)
[place] [phone]   [phone]  OFFICE PROGRESS NOTE  [name], MD [address] [place] [address]  DIAGNOSIS: Stage IVb (T1b, N2, M1c) non-small cell lung cancer, adenocarcinoma in a never smoker patient diagnosed in January 2018 and presented with left upper lobe lung mass, mediastinal lymphadenopathy as well as metastatic disease to the bone, liver, adrenal glands as well as abdominal wall nodules and brain metastasis. PDL 1 expression 70%.  Genomic Alterations Identified? KRAS G12V NOTCH2 rearrangement exon 27 TD32 splice site [id number] Additional Findings? Microsatellite status MS-Stable Tumor Mutation Burden TMB-Intermediate; 8 Muts/Mb Additional Disease-relevant Genes with No Reportable Alterations Identified? EGFR ALK BRAF MET RET ERBB2 ROS1  PRIOR THERAPY:  1) Palliative radiotherapy to the chest and sacrum under the care of Dr. Tammi Klippel. 2)  Systemic chemotherapy with carboplatin for AUC of 5, Alimta 500 MG/M2 and Avastin 15 MG/KG every 3 weeks. Status post 3 cycles. First dose was given 12/22/2016. Last cycle was given on 02/02/2017, discontinued secondary to disease progression.  CURRENT THERAPY: Treatment with immunotherapy with Ketruda (pembrolizumab) 200 MG IV every 3 weeks. First dose 03/02/2017.  INTERVAL HISTORY: Karen Vasquez 66 y.o. female returns to the clinic today for follow-up visit accompanied by her daughter. The patient is feeling fine today was no specific complaints. She has been tolerating her treatment with carboplatin, Alimta and Avastin fairly well with no significant adverse effects except for recent sinus infection with yellowish drainage. She also has some scratchy throat. She has low-grade fever today. She denied having any chest pain, shortness of breath, cough or hemoptysis. She denied having any weight loss or night sweats. She has no nausea, vomiting, diarrhea or constipation.  She had repeat CT scan of the chest, abdomen and pelvis performed recently and she is here for evaluation and discussion of her scan results.   MEDICAL HISTORY: Past Medical History:  Diagnosis Date  . Adenocarcinoma of left lung, stage 4 ([place]) 12/16/2016  . Arthritis   . Asthma due to seasonal allergies   . Encounter for antineoplastic chemotherapy 12/17/2016  . GERD (gastroesophageal reflux disease)   . Goals of care, counseling/discussion 12/17/2016  . Hernia of abdominal wall    umbilical  . Hypertension 01/09/2017  . No pertinent past medical history    COLD UPPER RESP STARTED ZPAK 6/14  . Recurrent upper respiratory infection (URI)    recent sinus inf  . Thyroid nodule   . Urine blood    hematuria    ALLERGIES:  is allergic to prednisone; fenofibrate; quinolones; statins; and septra [sulfamethoxazole-trimethoprim].  MEDICATIONS:  Current Outpatient Prescriptions  Medication Sig Dispense Refill  . Cholecalciferol (VITAMIN D) 2000 units tablet Take 2,000 Units by mouth daily.    Marland Kitchen conjugated estrogens (PREMARIN) vaginal cream Place 1 Applicatorful vaginally daily.    . Cranberry 500 MG TABS Take 1 tablet by mouth 2 (two) times daily.    Marland Kitchen ezetimibe (ZETIA) 10 MG tablet Take 10 mg by mouth daily.    . folic acid (FOLVITE) 1 MG tablet Take 1 tablet (1 mg total) by mouth daily. 30 tablet 4  . ketotifen (ZADITOR) 0.025 % ophthalmic solution Place 1 drop into both eyes 2 (two) times daily.    Marland Kitchen loratadine (CLARITIN) 10 MG tablet Take 10 mg by mouth daily.    Marland Kitchen LORazepam (ATIVAN) 0.5 MG tablet 1 tablet po 30 minutes prior to radiation or MRI 30 tablet 0  . LORazepam (ATIVAN)  0.5 MG tablet Take 1 tablet (0.5 mg total) by mouth every 8 (eight) hours. 30 tablet 0  . Multiple Vitamin (MULTIVITAMIN) tablet Take 1 tablet by mouth daily.    Marland Kitchen OLANZapine (ZYPREXA) 10 MG tablet Take 1 tablet (10 mg total) by mouth at bedtime. For Nausea (Patient not taking: Reported on 02/08/2017) 30 tablet 1  .  ondansetron (ZOFRAN) 8 MG tablet Take 1 tablet (8 mg total) by mouth every 8 (eight) hours as needed for nausea or vomiting. (Patient not taking: Reported on 02/08/2017) 30 tablet 0  . oxyCODONE (OXY IR/ROXICODONE) 5 MG immediate release tablet Take 1-3 tablets (5-15 mg total) by mouth every 4 (four) hours as needed for severe pain. 100 tablet 0  . pantoprazole (PROTONIX) 40 MG tablet Take 40 mg by mouth 2 (two) times daily.     . prochlorperazine (COMPAZINE) 10 MG tablet TAKE ONE TABLET BY MOUTH EVERY 6 HOURS AS NEEDED FOR NAUSEA/VOMITING (Patient not taking: Reported on 02/08/2017) 30 tablet 0  . raloxifene (EVISTA) 60 MG tablet Take 60 mg by mouth daily.    . sodium chloride (OCEAN) 0.65 % nasal spray Place 1 spray into the nose daily as needed for congestion.     . sucralfate (CARAFATE) 1 g tablet Take 1 tablet (1 g total) by mouth 4 (four) times daily -  with meals and at bedtime. 5 minutes before food (Patient not taking: Reported on 02/08/2017) 120 tablet 5  . trimethoprim (TRIMPEX) 100 MG tablet Take 100 mg by mouth daily.      No current facility-administered medications for this visit.     SURGICAL HISTORY:  Past Surgical History:  Procedure Laterality Date  . ABDOMINAL HYSTERECTOMY  08   vag, rectocele repair , bladder sling  . ANTERIOR CERVICAL DECOMP/DISCECTOMY FUSION  05/09/2012   Procedure: ANTERIOR CERVICAL DECOMPRESSION/DISCECTOMY FUSION 1 LEVEL;  Surgeon: Eustace Moore, MD;  Location: [facility];  Service: Neurosurgery;  Laterality: N/A;  anterior cervical decompression fusion cervical five-six  . BREAST CYST EXCISION  96   fibroadenoma lft  . BREAST SURGERY     03  . CARPAL TUNNEL RELEASE  05   bil  . DILATION AND CURETTAGE OF UTERUS  03    polyps  . TUBAL LIGATION  88    REVIEW OF SYSTEMS:  Constitutional: positive for fatigue Eyes: negative Ears, nose, mouth, throat, and face: positive for nasal congestion and sore throat Respiratory: negative Cardiovascular:  negative Gastrointestinal: negative Genitourinary:negative Integument/breast: negative Hematologic/lymphatic: negative Musculoskeletal:negative Neurological: negative Behavioral/Psych: negative Endocrine: negative Allergic/Immunologic: negative   PHYSICAL EXAMINATION: General appearance: alert, cooperative and no distress Head: Normocephalic, without obvious abnormality, atraumatic Neck: no adenopathy, no JVD, supple, symmetrical, trachea midline and thyroid not enlarged, symmetric, no tenderness/mass/nodules Lymph nodes: Cervical, supraclavicular, and axillary nodes normal. Resp: clear to auscultation bilaterally Back: symmetric, no curvature. ROM normal. No CVA tenderness. Cardio: regular rate and rhythm, S1, S2 normal, no murmur, click, rub or gallop GI: soft, non-tender; bowel sounds normal; no masses,  no organomegaly Extremities: extremities normal, atraumatic, no cyanosis or edema Neurologic: Alert and oriented X 3, normal strength and tone. Normal symmetric reflexes. Normal coordination and gait  ECOG PERFORMANCE STATUS: 1 - Symptomatic but completely ambulatory  Blood pressure 135/61, pulse 96, temperature 99.2 F (37.3 C), temperature source Oral, resp. rate 18, height 5' 4"  (8.469 m), weight 137 lb 9.6 oz (62.4 kg), SpO2 100 %.  LABORATORY DATA: Lab Results  Component Value Date   WBC 5.9 02/23/2017  HGB 10.1 (L) 02/23/2017   HCT 29.2 (L) 02/23/2017   MCV 88.9 02/23/2017   PLT 230 02/23/2017      Chemistry      Component Value Date/Time   NA 142 02/23/2017 1057   K 4.1 02/23/2017 1057   CL 101 05/01/2012 0927   CO2 22 02/23/2017 1057   BUN 14.4 02/23/2017 1057   CREATININE 1.2 (H) 02/23/2017 1057      Component Value Date/Time   CALCIUM 8.8 02/23/2017 1057   ALKPHOS 100 02/23/2017 1057   AST 17 02/23/2017 1057   ALT 15 02/23/2017 1057   BILITOT 0.30 02/23/2017 1057       RADIOGRAPHIC STUDIES: Ct Chest W Contrast  Result Date: 02/21/2017 CLINICAL  DATA:  Stage IVb left upper lobe lung adenocarcinoma diagnosed January 2018 status post palliative radiotherapy to the chest and sacrum with ongoing systemic chemotherapy. EXAM: CT CHEST, ABDOMEN, AND PELVIS WITH CONTRAST TECHNIQUE: Multidetector CT imaging of the chest, abdomen and pelvis was performed following the standard protocol during bolus administration of intravenous contrast. CONTRAST:  12m ISOVUE-300 IOPAMIDOL (ISOVUE-300) INJECTION 61% COMPARISON:  11/17/2016 PET-CT. 11/01/2016 chest CT. 01/25/2013 CT abdomen/ pelvis. FINDINGS: CT CHEST FINDINGS Cardiovascular: Normal heart size. Trace pericardial effusion/thickening is new. Atherosclerotic nonaneurysmal thoracic aorta. Normal caliber pulmonary arteries. No central pulmonary emboli. Mediastinum/Nodes: Stable subcentimeter hypodense partially calcified left thyroid lobe nodule. Unremarkable esophagus. No axillary adenopathy. Enlarged 1.4 cm AP window node (series 2/ image 21), decreased from 2.8 cm. No additional pathologically enlarged mediastinal or hilar lymph nodes. Right paratracheal previously hypermetabolic 0.7 cm node (series 2/ image 20), decreased from 1.1 cm. Lungs/Pleura: No pneumothorax. No pleural effusion. Mild centrilobular emphysema. Anteromedial left upper lobe 0.8 x 0.6 cm pulmonary nodule (series 4/ image 77), decreased from 1.7 x 1.7 cm. New subpleural medial left upper lobe 1.6 x 0.9 cm bandlike opacity (series 4/ image 52). Otherwise no acute consolidative airspace disease or air additional significant pulmonary nodules. Musculoskeletal: Healing nondisplaced probably pathologic fractures in the lateral right seventh and left fifth ribs. New moderate pathologic T12 vertebral compression fracture. Small newly sclerotic lesions are noted throughout the thoracic spine, sternum and bilateral ribs. Moderate thoracic spondylosis. CT ABDOMEN PELVIS FINDINGS Hepatobiliary: Dominant 4.4 x 4.2 cm hypodense right liver dome mass (series 2/  image 45), increased from 3.9 x 3.1 cm on 11/17/2016. Numerous new hypodense satellite liver dome nodules measuring up to 2.4 x 1.7 cm (series 2/ image 44). At least 5 additional hypodense subcentimeter liver lesions scattered throughout the remaining liver measuring up to 0.8 cm in the inferior right liver lobe (series 2/ image 70), not previously visualized on the noncontrast CT images from 11/17/2016 PET-CT. Normal gallbladder with no radiopaque cholelithiasis. No biliary ductal dilatation. Pancreas: Normal, with no mass or duct dilation. Spleen: Normal size. No mass. Adrenals/Urinary Tract: Stable 1.4 cm right adrenal nodule (series 2/ image 56). Hypodense 2.5 x 1.9 cm left adrenal nodule (series 2/ image 56), previously 2.6 x 2.1 cm on 11/01/2016 using similar measurement technique, slightly decreased. No hydronephrosis. Simple 5.2 cm renal cyst in the medial interpolar right kidney. Subcentimeter hypodense lower left renal cortical lesions, too small to characterize. Stable 16 x 13 mm stone in the dependent right bladder. Otherwise grossly normal nondistended bladder. Stomach/Bowel: Small hiatal hernia. Otherwise grossly normal stomach. Normal caliber small bowel with no small bowel wall thickening. Normal appendix . Minimal sigmoid diverticulosis, with no large bowel wall thickening or pericolonic fat stranding. Oral contrast reaches the rectum. Vascular/Lymphatic:  Normal caliber abdominal aorta. Patent portal, splenic, hepatic and renal veins. No pathologically enlarged lymph nodes in the abdomen or pelvis. Reproductive: Status post hysterectomy, with no abnormal findings at the vaginal cuff. No adnexal mass. Other: No pneumoperitoneum, ascites or focal fluid collection. Stable small fat containing periumbilical hernia. Musculoskeletal: Newly sclerotic lesions scattered throughout the bilateral iliac bones, bilateral sacrum and lumbar spine. Mild lumbar spondylosis . IMPRESSION: 1. Left upper lobe pulmonary  nodule and mediastinal lymphadenopathy is decreased in size. 2. New subpleural 1.6 cm nodular opacity in the medial left upper lobe, nonspecific, favor early posttreatment change or other inflammatory etiology, cannot exclude metastasis. 3. Liver metastases are increased in size and number. 4. Adrenal metastases are stable to decreased in size. 5. Newly sclerotic osseous lesions scattered throughout the axial skeleton, which may represent treatment effect. Healing pathologic bilateral rib fractures. New pathologic moderate T12 vertebral compression fracture. 6. Trace pericardial effusion/thickening, new. 7. Stable 16 mm dependent right bladder stone.  No hydronephrosis. 8. Aortic atherosclerosis. Several additional chronic findings as above. Electronically Signed   By: Ilona Sorrel M.D.   On: 02/21/2017 11:41   Mr Jeri Cos FW Contrast  Result Date: 02/06/2017 CLINICAL DATA:  Continued surveillance metastatic stage IV lung cancer. Recent SRS 01/09/2017 to 4 targets, 18 Gy for the right choroid plexus target and 20 Gy for the other 3 left convexity, left frontal, right parietal targets. EXAM: MRI HEAD WITHOUT AND WITH CONTRAST TECHNIQUE: Multiplanar, multiecho pulse sequences of the brain and surrounding structures were obtained without and with intravenous contrast. CONTRAST:  70m MULTIHANCE GADOBENATE DIMEGLUMINE 529 MG/ML IV SOLN COMPARISON:  None. FINDINGS: Brain: RIGHT choroid plexus mass decreased in size, estimated 50%. Decreased size, enhancement, and restriction involving the LEFT posterior frontal metastasis, image 108 series 11. Decreased size and enhancement of the RIGHT parietal metastasis, image 118 series 11. Slight decreased size in the dural-based convexity mass, 7 versus 9 mm, metastasis versus meningioma, as seen on coronal postcontrast imaging, reference image 29 series 12. Vasogenic edema surrounding the LEFT frontal metastasis is improved. No current areas of restricted diffusion either in  the cerebral hemispheres or in the cerebellum. 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 Stable cerebral volume. Mild white matter disease, unchanged. No new lesions are identified. Vascular: Normal flow voids. Skull and upper cervical spine: Normal marrow signal. Stable RIGHT frontal hemangioma. Previous C5-6 ACDF. Sinuses/Orbits: Negative. Other: None. IMPRESSION: Overall improved appearance status post multitarget SRS. All lesions have shown favorable response. Electronically Signed   By: JStaci RighterM.D.   On: 02/06/2017 14:51   Ct Abdomen Pelvis W Contrast  Result Date: 02/21/2017 CLINICAL DATA:  Stage IVb left upper lobe lung adenocarcinoma diagnosed January 2018 status post palliative radiotherapy to the chest and sacrum with ongoing systemic chemotherapy. EXAM: CT CHEST, ABDOMEN, AND PELVIS WITH CONTRAST TECHNIQUE: Multidetector CT imaging of the chest, abdomen and pelvis was performed following the standard protocol during bolus administration of intravenous contrast. CONTRAST:  1079mISOVUE-300 IOPAMIDOL (ISOVUE-300) INJECTION 61% COMPARISON:  11/17/2016 PET-CT. 11/01/2016 chest CT. 01/25/2013 CT abdomen/ pelvis. FINDINGS: CT CHEST FINDINGS Cardiovascular: Normal heart size. Trace pericardial effusion/thickening is new. Atherosclerotic nonaneurysmal thoracic aorta. Normal caliber pulmonary arteries. No central pulmonary emboli. Mediastinum/Nodes: Stable subcentimeter hypodense partially calcified left thyroid lobe nodule. Unremarkable esophagus. No axillary adenopathy. Enlarged 1.4 cm AP window node (series 2/ image 21), decreased from 2.8 cm. No additional pathologically enlarged mediastinal or hilar lymph nodes. Right paratracheal  previously hypermetabolic 0.7 cm node (series 2/ image 20), decreased from 1.1 cm. Lungs/Pleura: No pneumothorax. No pleural effusion. Mild centrilobular emphysema. Anteromedial left upper lobe 0.8 x  0.6 cm pulmonary nodule (series 4/ image 77), decreased from 1.7 x 1.7 cm. New subpleural medial left upper lobe 1.6 x 0.9 cm bandlike opacity (series 4/ image 52). Otherwise no acute consolidative airspace disease or air additional significant pulmonary nodules. Musculoskeletal: Healing nondisplaced probably pathologic fractures in the lateral right seventh and left fifth ribs. New moderate pathologic T12 vertebral compression fracture. Small newly sclerotic lesions are noted throughout the thoracic spine, sternum and bilateral ribs. Moderate thoracic spondylosis. CT ABDOMEN PELVIS FINDINGS Hepatobiliary: Dominant 4.4 x 4.2 cm hypodense right liver dome mass (series 2/ image 45), increased from 3.9 x 3.1 cm on 11/17/2016. Numerous new hypodense satellite liver dome nodules measuring up to 2.4 x 1.7 cm (series 2/ image 44). At least 5 additional hypodense subcentimeter liver lesions scattered throughout the remaining liver measuring up to 0.8 cm in the inferior right liver lobe (series 2/ image 70), not previously visualized on the noncontrast CT images from 11/17/2016 PET-CT. Normal gallbladder with no radiopaque cholelithiasis. No biliary ductal dilatation. Pancreas: Normal, with no mass or duct dilation. Spleen: Normal size. No mass. Adrenals/Urinary Tract: Stable 1.4 cm right adrenal nodule (series 2/ image 56). Hypodense 2.5 x 1.9 cm left adrenal nodule (series 2/ image 56), previously 2.6 x 2.1 cm on 11/01/2016 using similar measurement technique, slightly decreased. No hydronephrosis. Simple 5.2 cm renal cyst in the medial interpolar right kidney. Subcentimeter hypodense lower left renal cortical lesions, too small to characterize. Stable 16 x 13 mm stone in the dependent right bladder. Otherwise grossly normal nondistended bladder. Stomach/Bowel: Small hiatal hernia. Otherwise grossly normal stomach. Normal caliber small bowel with no small bowel wall thickening. Normal appendix . Minimal sigmoid  diverticulosis, with no large bowel wall thickening or pericolonic fat stranding. Oral contrast reaches the rectum. Vascular/Lymphatic: Normal caliber abdominal aorta. Patent portal, splenic, hepatic and renal veins. No pathologically enlarged lymph nodes in the abdomen or pelvis. Reproductive: Status post hysterectomy, with no abnormal findings at the vaginal cuff. No adnexal mass. Other: No pneumoperitoneum, ascites or focal fluid collection. Stable small fat containing periumbilical hernia. Musculoskeletal: Newly sclerotic lesions scattered throughout the bilateral iliac bones, bilateral sacrum and lumbar spine. Mild lumbar spondylosis . IMPRESSION: 1. Left upper lobe pulmonary nodule and mediastinal lymphadenopathy is decreased in size. 2. New subpleural 1.6 cm nodular opacity in the medial left upper lobe, nonspecific, favor early posttreatment change or other inflammatory etiology, cannot exclude metastasis. 3. Liver metastases are increased in size and number. 4. Adrenal metastases are stable to decreased in size. 5. Newly sclerotic osseous lesions scattered throughout the axial skeleton, which may represent treatment effect. Healing pathologic bilateral rib fractures. New pathologic moderate T12 vertebral compression fracture. 6. Trace pericardial effusion/thickening, new. 7. Stable 16 mm dependent right bladder stone.  No hydronephrosis. 8. Aortic atherosclerosis. Several additional chronic findings as above. Electronically Signed   By: Ilona Sorrel M.D.   On: 02/21/2017 11:41    ASSESSMENT AND PLAN:  This is a very pleasant 66 years old white female never smoker with a stage IV non-small cell lung cancer, adenocarcinoma with no actional mutations but. PDL 1 expression of 70%. She was started on treatment with carboplatin, Alimta and Avastin status post 3 cycles. She tolerated her treatment well. She had repeat CT scan of the chest, abdomen and pelvis performed recently. I personally and  independently reviewed the scan images and discuss the results and showed the images to the patient and her daughter. Unfortunately her scan showed mixed response with improvement of the lung mass and mediastinal lymphadenopathy but there was evidence for disease progression with increase in the size as well as new hepatic metastasis. I had a lengthy discussion with the patient today about her condition and goals of care. I recommended for her treatment with immunotherapy with Ketruda (pembrolizumab) 200 MG IV every 3 weeks. I discussed with the patient adverse effect of this treatment including but not limited to immune mediated skin rash, diarrhea, inflammation of the lung, kidney, liver, thyroid or other endocrine dysfunction. The patient agreed to proceed with treatment as planned. The patient is interested in treatment and she is expected to start the first dose of this treatment on 03/02/2017. For the questionable sinusitis infection, I will start the patient on Z-Pak. For anxiety, the patient was given a refill for Ativan. She will come back for follow-up visit in 4 weeks for evaluation with the start of cycle #2. The patient voices understanding of current disease status and treatment options and is in agreement with the current care plan. All questions were answered. The patient knows to call the clinic with any problems, questions or concerns. We can certainly see the patient much sooner if necessary. Disclaimer: This note was dictated with voice recognition software. Similar sounding words can inadvertently be transcribed and may not be corrected upon review.

## 2017-02-25 ENCOUNTER — Encounter: Payer: Self-pay | Admitting: Internal Medicine

## 2017-02-25 DIAGNOSIS — J329 Chronic sinusitis, unspecified: Secondary | ICD-10-CM

## 2017-02-25 HISTORY — DX: Chronic sinusitis, unspecified: J32.9

## 2017-02-27 DIAGNOSIS — R05 Cough: Secondary | ICD-10-CM | POA: Diagnosis not present

## 2017-02-27 DIAGNOSIS — J3089 Other allergic rhinitis: Secondary | ICD-10-CM | POA: Diagnosis not present

## 2017-02-27 DIAGNOSIS — H1045 Other chronic allergic conjunctivitis: Secondary | ICD-10-CM | POA: Diagnosis not present

## 2017-02-27 NOTE — Progress Notes (Signed)
Adenocarcinoma of the left lung, Stage IV ([place]), with liver and brain metastases  radiation completed 01-23-17 one month FU.  Pain: None Fatigue:Reports mild fatigue,able to do her daily routine with rest breaks. Respiratory issues:Coughing up clear to yellow secretion had recent sinus infection just completed a Zpack 02-27-17 Pain/difficulty swallowing or feel like there is a lump in the their chest.  Are they taking Carafate?: Denies any problems with eating or drinking liquids. Skin change:None to chest or sacrum. Weight: Wt Readings from Last 3 Encounters:  02/28/17 137 lb 6.4 oz (62.3 kg)  02/23/17 137 lb 9.6 oz (62.4 kg)  02/08/17 138 lb 3.2 oz (62.7 kg)   Appetite:Fair eating three meals per day with snacks. Nausea/Vomiting:Reports having some nausea and diarrhea with the Zpack it is getting better today. Chemotherapy: N/A  Joslyn Devon on Thursday, 03-02-17 02-23-17 Dr. Julien Nordmann  I recommended for her treatment with immunotherapy with Ketruda (pembrolizumab) 200 MG IV every 3 weekssitis infection, I will start the patient on Z-Pak. For the questionable sinu recommended for sinusitis infection, I will start the patient on Z-Pak. For anxiety, the patient was given a refill for Ativan. She will come back for follow-up visit in 4 weeks for evaluation with the start of cycle #2. BP 124/82   Pulse 87   Temp 98.6 F (37 C) (Oral)   Resp 16   Ht '5\' 4"'$  (1.626 m)   Wt 137 lb 6.4 oz (62.3 kg)   SpO2 100%   BMI 23.58 kg/m

## 2017-02-28 ENCOUNTER — Encounter: Payer: Self-pay | Admitting: Urology

## 2017-02-28 ENCOUNTER — Ambulatory Visit
Admission: RE | Admit: 2017-02-28 | Discharge: 2017-02-28 | Disposition: A | Discharge status: Home / Self Care | Payer: BLUE CROSS/BLUE SHIELD | Source: Ambulatory Visit | Attending: Urology | Admitting: Urology

## 2017-02-28 ENCOUNTER — Other Ambulatory Visit: Payer: BLUE CROSS/BLUE SHIELD

## 2017-02-28 VITALS — BP 124/82 | HR 87 | Temp 98.6°F | Resp 16 | Ht 64.0 in | Wt 137.4 lb

## 2017-02-28 DIAGNOSIS — E041 Nontoxic single thyroid nodule: Secondary | ICD-10-CM | POA: Diagnosis not present

## 2017-02-28 DIAGNOSIS — Z8042 Family history of malignant neoplasm of prostate: Secondary | ICD-10-CM | POA: Diagnosis not present

## 2017-02-28 DIAGNOSIS — J329 Chronic sinusitis, unspecified: Secondary | ICD-10-CM | POA: Diagnosis not present

## 2017-02-28 DIAGNOSIS — J45909 Unspecified asthma, uncomplicated: Secondary | ICD-10-CM | POA: Diagnosis not present

## 2017-02-28 DIAGNOSIS — K219 Gastro-esophageal reflux disease without esophagitis: Secondary | ICD-10-CM | POA: Diagnosis not present

## 2017-02-28 DIAGNOSIS — Z888 Allergy status to other drugs, medicaments and biological substances status: Secondary | ICD-10-CM | POA: Diagnosis not present

## 2017-02-28 DIAGNOSIS — C7802 Secondary malignant neoplasm of left lung: Secondary | ICD-10-CM | POA: Diagnosis not present

## 2017-02-28 DIAGNOSIS — C7970 Secondary malignant neoplasm of unspecified adrenal gland: Secondary | ICD-10-CM

## 2017-02-28 DIAGNOSIS — I10 Essential (primary) hypertension: Secondary | ICD-10-CM | POA: Diagnosis not present

## 2017-02-28 DIAGNOSIS — Z803 Family history of malignant neoplasm of breast: Secondary | ICD-10-CM | POA: Diagnosis not present

## 2017-02-28 DIAGNOSIS — C3492 Malignant neoplasm of unspecified part of left bronchus or lung: Secondary | ICD-10-CM

## 2017-02-28 DIAGNOSIS — C7951 Secondary malignant neoplasm of bone: Secondary | ICD-10-CM | POA: Diagnosis not present

## 2017-02-28 DIAGNOSIS — Z90710 Acquired absence of both cervix and uterus: Secondary | ICD-10-CM | POA: Diagnosis not present

## 2017-02-28 DIAGNOSIS — C787 Secondary malignant neoplasm of liver and intrahepatic bile duct: Secondary | ICD-10-CM

## 2017-02-28 DIAGNOSIS — Z9889 Other specified postprocedural states: Secondary | ICD-10-CM | POA: Diagnosis not present

## 2017-02-28 DIAGNOSIS — C7931 Secondary malignant neoplasm of brain: Secondary | ICD-10-CM

## 2017-02-28 DIAGNOSIS — Z510 Encounter for antineoplastic radiation therapy: Secondary | ICD-10-CM | POA: Diagnosis not present

## 2017-02-28 NOTE — Progress Notes (Signed)
Radiation Oncology         ([phone]) [phone] ________________________________  Follow-up Visit Note  Name: Karen Vasquez MRN: [id number]  Date: 02/28/2017  DOB: [birth year]  EL:FYBOF [name], MD  [name], MD   REFERRING PHYSICIAN: [name], MD  DIAGNOSIS: The primary encounter diagnosis was Adenocarcinoma of left lung, stage 4 ([place]). 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  66 y.o. woman with adenocarcinoma of the left lung, stage IV ([place]), with liver and brain metastases  INTERVAL SINCE LAST RADIATION: 5 weeks 01/04/17-01/23/17: 35 Gy to the chest, 20 Gy to PTV 1-4, 20 Gy to sacrum  01/09/17: Sherlynn Stalls Gorey received stereotactic radiosurgery to the following targets: Four targets (PTV1 Rt Choroid 24 mm, PTV2 Lt Frontal 6 mm, PTV3 Rt Parietal 2 mm, PTV4 Lt Convexity 7 mm) were treated using 4 Dynamic Conformal Arcs to a prescription dose of 18-20 Gy.  ExacTrac registration was performed for each couch angle.  The 100% isodose line was prescribed.  6 MV X-rays were delivered in the flattening filter free beam mode.  NARRATIVE: Karen Vasquez is a 66 y.o. female seen for a routine follow-up. In summary, the patient was found to have a poorly defined 3.7 cm lesion in the mediastinal prevascular space and a 1.8 spiculated lesion in the anterior medial left upper lobe on a CT scan of the chest and neck on 11/01/16.  PET scan on 11/17/16 showed widespread malignancy with metastatic disease to the liver, bilateral adrenal glands, abdominal lymph nodes, and bony metastasis. She was seen by Dr. Julien Nordmann on 12/16/16, and was started on Carboplatin, Alimta, and Avastin on 12/22/16. She completed 3 cycles, with her last on 02/02/17.  The patient was seen in radiation oncology on 12/26/16 and completed palliative radiation to the chest, thoracic spine, and  sacrum on 01/23/17.    The patient underwent Brain MRI on 12/20/2016. This scan showed a small intraparenchymal metastatic lesion within the left frontal lobe with very mild surrounding vasogenic edema, with no mass effect or hemorrhage. Also noted was asymmetrically enlarged and contrast-enhancing right choroid plexus. In the context of known widespread metastatic disease, this is concerning for a metastasis. There was also a small left frontal convexity dural-based mass, most consistent with meningioma. However, in this scenario, a dural-based metastasis would be difficult to exclude.   Repeat MRI on 01/01/17 for [facility] planning purposes demonstrated a new 12 mm curvilinear enhancement in the posterior right cerebellum; indeterminate but felt to most resemble a subacute lacunar infarct.  She has completed SRS to the brain lesions on 01/09/17.  Recent surveillance CT C/A/P on 02/21/17 demonstrated disease progression with increased size and number of liver metastases despite continued systemic chemotherapy. The LUL nodule measured 0.8 x 0.6 cm, decreased from 1.7 x 1.7 cm previously and mediastinal adenopathy showed improvement as well. New moderate pathologic T12 vertebral compression fracture. Small newly sclerotic lesions are noted throughout the thoracic spine, sternum and bilateral ribs but may represent treatment effect. Adrenal mets are stable to decreased in size.   She had recent follow up with Dr. Julien Nordmann on 02/23/17 with the decision to change her current systemic therapy due to disease progression.  Carboplatin, Alimta, and Avastin were discontinued and patient is planned to start immunotherapy with Keytruda (pembrolizumab) 200 MG IV every 3 weeks beginning on 03/02/17.  She was also started on Z-pak for suspected  sinus infection.  ROS: She reports some abdominal cramping this morning but denies other pain at this time. She continues with intermittent blurred vision for short periods of time, mild  shortness of breath when talking, and a dry cough. She has noticed a significant improvement in her nausea and appetite since discontinuing most recent systemic therapy. She has recently had mild nausea and diarrhea which she attributes to being on antibiotics for a recent sinus infection. She manages the nausea with Ativan as needed. She completed antibiotics on 02/27/17 and has already noticed improved nausea and no further diarrhea this morning.  She has had significant improvement in her sinus pressure and congestion.  She has not had fever since starting abx.  She denies headaches, productive cough, pain or difficulty swallowing, or skin irritation. Patient reports she has a little bit of memory issues that does not affect her daily life. Today, she was able to answer all questions appropriately and without difficulty or problems with word finding.  She anticipates beginning immunotherapy with Keytruda on Thursday, 03/02/17.   PAST MEDICAL HISTORY:  Past Medical History:  Diagnosis Date  . Adenocarcinoma of left lung, stage 4 ([place]) 12/16/2016  . Arthritis   . Asthma due to seasonal allergies   . Chronic fatigue 02/23/2017  . Encounter for antineoplastic chemotherapy 12/17/2016  . GERD (gastroesophageal reflux disease)   . Goals of care, counseling/discussion 12/17/2016  . Hernia of abdominal wall    umbilical  . Hypertension 01/09/2017  . No pertinent past medical history    COLD UPPER RESP STARTED ZPAK 6/14  . Recurrent upper respiratory infection (URI)    recent sinus inf  . Sinus infection 02/25/2017  . Thyroid nodule   . Urine blood    hematuria      PAST SURGICAL HISTORY: Past Surgical History:  Procedure Laterality Date  . ABDOMINAL HYSTERECTOMY  08   vag, rectocele repair , bladder sling  . ANTERIOR CERVICAL DECOMP/DISCECTOMY FUSION  05/09/2012   Procedure: ANTERIOR CERVICAL DECOMPRESSION/DISCECTOMY FUSION 1 LEVEL;  Surgeon: Eustace Moore, MD;  Location: [facility];  Service:  Neurosurgery;  Laterality: N/A;  anterior cervical decompression fusion cervical five-six  . BREAST CYST EXCISION  96   fibroadenoma lft  . BREAST SURGERY     03  . CARPAL TUNNEL RELEASE  05   bil  . DILATION AND CURETTAGE OF UTERUS  03    polyps  . TUBAL LIGATION  88    FAMILY HISTORY:  Family History  Problem Relation Age of Onset  . Cancer Mother     breast  . Cancer Father     prostate  . Muscular dystrophy Son     SOCIAL HISTORY:  Social History   Social History  . Marital status: Married    Spouse name: N/A  . Number of children: N/A  . Years of education: N/A   Occupational History  . Not on file.   Social History Main Topics  . Smoking status: Never Smoker  . Smokeless tobacco: Never Used  . Alcohol use No  . Drug use: No  . Sexual activity: Not Currently   Other Topics Concern  . Not on file   Social History Narrative  . No narrative on file    ALLERGIES: Prednisone; Fenofibrate; Quinolones; Statins; and Septra [sulfamethoxazole-trimethoprim]  MEDICATIONS:  Current Outpatient Prescriptions  Medication Sig Dispense Refill  . azithromycin (ZITHROMAX) 250 MG tablet Use as instructed 6 each 0  . Cholecalciferol (VITAMIN D) 2000 units  tablet Take 2,000 Units by mouth daily.    Mar[place] Kitchen conjugated estrogens (PREMARIN) vaginal cream Place 1 Applicatorful vaginally daily.    . Cranberry 500 MG TABS Take 1 tablet by mouth 2 (two) times daily.    Marland Kitchen ezetimibe (ZETIA) 10 MG tablet Take 10 mg by mouth daily.    . folic acid (FOLVITE) 1 MG tablet Take 1 tablet (1 mg total) by mouth daily. 30 tablet 4  . ketotifen (ZADITOR) 0.025 % ophthalmic solution Place 1 drop into both eyes 2 (two) times daily.    Marland Kitchen loratadine (CLARITIN) 10 MG tablet Take 10 mg by mouth daily.    Marland Kitchen LORazepam (ATIVAN) 0.5 MG tablet Take 1 tablet (0.5 mg total) by mouth every 8 (eight) hours. 30 tablet 0  . Multiple Vitamin (MULTIVITAMIN) tablet Take 1 tablet by mouth daily.    Marland Kitchen oxyCODONE (OXY  IR/ROXICODONE) 5 MG immediate release tablet Take 1-3 tablets (5-15 mg total) by mouth every 4 (four) hours as needed for severe pain. 100 tablet 0  . pantoprazole (PROTONIX) 40 MG tablet Take 40 mg by mouth 2 (two) times daily.     . raloxifene (EVISTA) 60 MG tablet Take 60 mg by mouth daily.    . sodium chloride (OCEAN) 0.65 % nasal spray Place 1 spray into the nose daily as needed for congestion.     Marland Kitchen trimethoprim (TRIMPEX) 100 MG tablet Take 100 mg by mouth daily.     Marland Kitchen LORazepam (ATIVAN) 0.5 MG tablet 1 tablet po 30 minutes prior to radiation or MRI (Patient not taking: Reported on 02/28/2017) 30 tablet 0  . OLANZapine (ZYPREXA) 10 MG tablet Take 1 tablet (10 mg total) by mouth at bedtime. For Nausea (Patient not taking: Reported on 02/28/2017) 30 tablet 1  . ondansetron (ZOFRAN) 8 MG tablet Take 1 tablet (8 mg total) by mouth every 8 (eight) hours as needed for nausea or vomiting. (Patient not taking: Reported on 02/08/2017) 30 tablet 0  . prochlorperazine (COMPAZINE) 10 MG tablet TAKE ONE TABLET BY MOUTH EVERY 6 HOURS AS NEEDED FOR NAUSEA/VOMITING (Patient not taking: Reported on 02/08/2017) 30 tablet 0  . sucralfate (CARAFATE) 1 g tablet Take 1 tablet (1 g total) by mouth 4 (four) times daily -  with meals and at bedtime. 5 minutes before food (Patient not taking: Reported on 02/08/2017) 120 tablet 5   No current facility-administered medications for this encounter.    PHYSICAL EXAM:  Wt Readings from Last 3 Encounters:  02/28/17 137 lb 6.4 oz (62.3 kg)  02/23/17 137 lb 9.6 oz (62.4 kg)  02/08/17 138 lb 3.2 oz (62.7 kg)   Temp Readings from Last 3 Encounters:  02/28/17 98.6 F (37 C) (Oral)  02/23/17 99.2 F (37.3 C) (Oral)  02/08/17 98 F (36.7 C) (Oral)   BP Readings from Last 3 Encounters:  02/28/17 124/82  02/23/17 135/61  02/08/17 136/82   Pulse Readings from Last 3 Encounters:  02/28/17 87  02/23/17 96  02/08/17 99   Pain Assessment Pain Score: 0-No pain/10  In  general this is a well appearing caucasian female in no acute distress. She is alert and oriented x4 and appropriate throughout the examination. Cardiopulmonary assessment is negative for acute distress and she exhibits normal effort.   LABORATORY DATA:  Lab Results  Component Value Date   WBC 5.9 02/23/2017   HGB 10.1 (L) 02/23/2017   HCT 29.2 (L) 02/23/2017   MCV 88.9 02/23/2017   PLT 230 02/23/2017   Lab  Results  Component Value Date   NA 142 02/23/2017   K 4.1 02/23/2017   CL 101 05/01/2012   CO2 22 02/23/2017   Lab Results  Component Value Date   ALT 15 02/23/2017   AST 17 02/23/2017   ALKPHOS 100 02/23/2017   BILITOT 0.30 02/23/2017     RADIOGRAPHY: Ct Chest W Contrast  Result Date: 02/21/2017 CLINICAL DATA:  Stage IVb left upper lobe lung adenocarcinoma diagnosed January 2018 status post palliative radiotherapy to the chest and sacrum with ongoing systemic chemotherapy. EXAM: CT CHEST, ABDOMEN, AND PELVIS WITH CONTRAST TECHNIQUE: Multidetector CT imaging of the chest, abdomen and pelvis was performed following the standard protocol during bolus administration of intravenous contrast. CONTRAST:  186m ISOVUE-300 IOPAMIDOL (ISOVUE-300) INJECTION 61% COMPARISON:  11/17/2016 PET-CT. 11/01/2016 chest CT. 01/25/2013 CT abdomen/ pelvis. FINDINGS: CT CHEST FINDINGS Cardiovascular: Normal heart size. Trace pericardial effusion/thickening is new. Atherosclerotic nonaneurysmal thoracic aorta. Normal caliber pulmonary arteries. No central pulmonary emboli. Mediastinum/Nodes: Stable subcentimeter hypodense partially calcified left thyroid lobe nodule. Unremarkable esophagus. No axillary adenopathy. Enlarged 1.4 cm AP window node (series 2/ image 21), decreased from 2.8 cm. No additional pathologically enlarged mediastinal or hilar lymph nodes. Right paratracheal previously hypermetabolic 0.7 cm node (series 2/ image 20), decreased from 1.1 cm. Lungs/Pleura: No pneumothorax. No pleural effusion.  Mild centrilobular emphysema. Anteromedial left upper lobe 0.8 x 0.6 cm pulmonary nodule (series 4/ image 77), decreased from 1.7 x 1.7 cm. New subpleural medial left upper lobe 1.6 x 0.9 cm bandlike opacity (series 4/ image 52). Otherwise no acute consolidative airspace disease or air additional significant pulmonary nodules. Musculoskeletal: Healing nondisplaced probably pathologic fractures in the lateral right seventh and left fifth ribs. New moderate pathologic T12 vertebral compression fracture. Small newly sclerotic lesions are noted throughout the thoracic spine, sternum and bilateral ribs. Moderate thoracic spondylosis. CT ABDOMEN PELVIS FINDINGS Hepatobiliary: Dominant 4.4 x 4.2 cm hypodense right liver dome mass (series 2/ image 45), increased from 3.9 x 3.1 cm on 11/17/2016. Numerous new hypodense satellite liver dome nodules measuring up to 2.4 x 1.7 cm (series 2/ image 44). At least 5 additional hypodense subcentimeter liver lesions scattered throughout the remaining liver measuring up to 0.8 cm in the inferior right liver lobe (series 2/ image 70), not previously visualized on the noncontrast CT images from 11/17/2016 PET-CT. Normal gallbladder with no radiopaque cholelithiasis. No biliary ductal dilatation. Pancreas: Normal, with no mass or duct dilation. Spleen: Normal size. No mass. Adrenals/Urinary Tract: Stable 1.4 cm right adrenal nodule (series 2/ image 56). Hypodense 2.5 x 1.9 cm left adrenal nodule (series 2/ image 56), previously 2.6 x 2.1 cm on 11/01/2016 using similar measurement technique, slightly decreased. No hydronephrosis. Simple 5.2 cm renal cyst in the medial interpolar right kidney. Subcentimeter hypodense lower left renal cortical lesions, too small to characterize. Stable 16 x 13 mm stone in the dependent right bladder. Otherwise grossly normal nondistended bladder. Stomach/Bowel: Small hiatal hernia. Otherwise grossly normal stomach. Normal caliber small bowel with no small  bowel wall thickening. Normal appendix . Minimal sigmoid diverticulosis, with no large bowel wall thickening or pericolonic fat stranding. Oral contrast reaches the rectum. Vascular/Lymphatic: Normal caliber abdominal aorta. Patent portal, splenic, hepatic and renal veins. No pathologically enlarged lymph nodes in the abdomen or pelvis. Reproductive: Status post hysterectomy, with no abnormal findings at the vaginal cuff. No adnexal mass. Other: No pneumoperitoneum, ascites or focal fluid collection. Stable small fat containing periumbilical hernia. Musculoskeletal: Newly sclerotic lesions scattered throughout the bilateral iliac  bones, bilateral sacrum and lumbar spine. Mild lumbar spondylosis . IMPRESSION: 1. Left upper lobe pulmonary nodule and mediastinal lymphadenopathy is decreased in size. 2. New subpleural 1.6 cm nodular opacity in the medial left upper lobe, nonspecific, favor early posttreatment change or other inflammatory etiology, cannot exclude metastasis. 3. Liver metastases are increased in size and number. 4. Adrenal metastases are stable to decreased in size. 5. Newly sclerotic osseous lesions scattered throughout the axial skeleton, which may represent treatment effect. Healing pathologic bilateral rib fractures. New pathologic moderate T12 vertebral compression fracture. 6. Trace pericardial effusion/thickening, new. 7. Stable 16 mm dependent right bladder stone.  No hydronephrosis. 8. Aortic atherosclerosis. Several additional chronic findings as above. Electronically Signed   By: Ilona Sorrel M.D.   On: 02/21/2017 11:41   Mr Jeri Cos TM Contrast  Result Date: 02/06/2017 CLINICAL DATA:  Continued surveillance metastatic stage IV lung cancer. Recent SRS 01/09/2017 to 4 targets, 18 Gy for the right choroid plexus target and 20 Gy for the other 3 left convexity, left frontal, right parietal targets. EXAM: MRI HEAD WITHOUT AND WITH CONTRAST TECHNIQUE: Multiplanar, multiecho pulse sequences of the  brain and surrounding structures were obtained without and with intravenous contrast. CONTRAST:  50m MULTIHANCE GADOBENATE DIMEGLUMINE 529 MG/ML IV SOLN COMPARISON:  None. FINDINGS: Brain: RIGHT choroid plexus mass decreased in size, estimated 50%. Decreased size, enhancement, and restriction involving the LEFT posterior frontal metastasis, image 108 series 11. Decreased size and enhancement of the RIGHT parietal metastasis, image 118 series 11. Slight decreased size in the dural-based convexity mass, 7 versus 9 mm, metastasis versus meningioma, as seen on coronal postcontrast imaging, reference image 29 series 12. Vasogenic edema surrounding the LEFT frontal metastasis is improved. No current areas of restricted diffusion either in the cerebral hemispheres or in the cerebellum. 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 Stable cerebral volume. Mild white matter disease, unchanged. No new lesions are identified. Vascular: Normal flow voids. Skull and upper cervical spine: Normal marrow signal. Stable RIGHT frontal hemangioma. Previous C5-6 ACDF. Sinuses/Orbits: Negative. Other: None. IMPRESSION: Overall improved appearance status post multitarget SRS. All lesions have shown favorable response. Electronically Signed   By: JStaci RighterM.D.   On: 02/06/2017 14:51   Ct Abdomen Pelvis W Contrast  Result Date: 02/21/2017 CLINICAL DATA:  Stage IVb left upper lobe lung adenocarcinoma diagnosed January 2018 status post palliative radiotherapy to the chest and sacrum with ongoing systemic chemotherapy. EXAM: CT CHEST, ABDOMEN, AND PELVIS WITH CONTRAST TECHNIQUE: Multidetector CT imaging of the chest, abdomen and pelvis was performed following the standard protocol during bolus administration of intravenous contrast. CONTRAST:  1034mISOVUE-300 IOPAMIDOL (ISOVUE-300) INJECTION 61% COMPARISON:  11/17/2016 PET-CT. 11/01/2016 chest CT. 01/25/2013 CT  abdomen/ pelvis. FINDINGS: CT CHEST FINDINGS Cardiovascular: Normal heart size. Trace pericardial effusion/thickening is new. Atherosclerotic nonaneurysmal thoracic aorta. Normal caliber pulmonary arteries. No central pulmonary emboli. Mediastinum/Nodes: Stable subcentimeter hypodense partially calcified left thyroid lobe nodule. Unremarkable esophagus. No axillary adenopathy. Enlarged 1.4 cm AP window node (series 2/ image 21), decreased from 2.8 cm. No additional pathologically enlarged mediastinal or hilar lymph nodes. Right paratracheal previously hypermetabolic 0.7 cm node (series 2/ image 20), decreased from 1.1 cm. Lungs/Pleura: No pneumothorax. No pleural effusion. Mild centrilobular emphysema. Anteromedial left upper lobe 0.8 x 0.6 cm pulmonary nodule (series 4/ image 77), decreased from 1.7 x 1.7 cm. New subpleural medial left upper lobe 1.6 x 0.9 cm bandlike opacity (series 4/ image 52). Otherwise  no acute consolidative airspace disease or air additional significant pulmonary nodules. Musculoskeletal: Healing nondisplaced probably pathologic fractures in the lateral right seventh and left fifth ribs. New moderate pathologic T12 vertebral compression fracture. Small newly sclerotic lesions are noted throughout the thoracic spine, sternum and bilateral ribs. Moderate thoracic spondylosis. CT ABDOMEN PELVIS FINDINGS Hepatobiliary: Dominant 4.4 x 4.2 cm hypodense right liver dome mass (series 2/ image 45), increased from 3.9 x 3.1 cm on 11/17/2016. Numerous new hypodense satellite liver dome nodules measuring up to 2.4 x 1.7 cm (series 2/ image 44). At least 5 additional hypodense subcentimeter liver lesions scattered throughout the remaining liver measuring up to 0.8 cm in the inferior right liver lobe (series 2/ image 70), not previously visualized on the noncontrast CT images from 11/17/2016 PET-CT. Normal gallbladder with no radiopaque cholelithiasis. No biliary ductal dilatation. Pancreas: Normal, with  no mass or duct dilation. Spleen: Normal size. No mass. Adrenals/Urinary Tract: Stable 1.4 cm right adrenal nodule (series 2/ image 56). Hypodense 2.5 x 1.9 cm left adrenal nodule (series 2/ image 56), previously 2.6 x 2.1 cm on 11/01/2016 using similar measurement technique, slightly decreased. No hydronephrosis. Simple 5.2 cm renal cyst in the medial interpolar right kidney. Subcentimeter hypodense lower left renal cortical lesions, too small to characterize. Stable 16 x 13 mm stone in the dependent right bladder. Otherwise grossly normal nondistended bladder. Stomach/Bowel: Small hiatal hernia. Otherwise grossly normal stomach. Normal caliber small bowel with no small bowel wall thickening. Normal appendix . Minimal sigmoid diverticulosis, with no large bowel wall thickening or pericolonic fat stranding. Oral contrast reaches the rectum. Vascular/Lymphatic: Normal caliber abdominal aorta. Patent portal, splenic, hepatic and renal veins. No pathologically enlarged lymph nodes in the abdomen or pelvis. Reproductive: Status post hysterectomy, with no abnormal findings at the vaginal cuff. No adnexal mass. Other: No pneumoperitoneum, ascites or focal fluid collection. Stable small fat containing periumbilical hernia. Musculoskeletal: Newly sclerotic lesions scattered throughout the bilateral iliac bones, bilateral sacrum and lumbar spine. Mild lumbar spondylosis . IMPRESSION: 1. Left upper lobe pulmonary nodule and mediastinal lymphadenopathy is decreased in size. 2. New subpleural 1.6 cm nodular opacity in the medial left upper lobe, nonspecific, favor early posttreatment change or other inflammatory etiology, cannot exclude metastasis. 3. Liver metastases are increased in size and number. 4. Adrenal metastases are stable to decreased in size. 5. Newly sclerotic osseous lesions scattered throughout the axial skeleton, which may represent treatment effect. Healing pathologic bilateral rib fractures. New pathologic  moderate T12 vertebral compression fracture. 6. Trace pericardial effusion/thickening, new. 7. Stable 16 mm dependent right bladder stone.  No hydronephrosis. 8. Aortic atherosclerosis. Several additional chronic findings as above. Electronically Signed   By: Ilona Sorrel M.D.   On: 02/21/2017 11:41      IMPRESSION/PLAN: 1. 66 y.o. woman with adenocarcinoma of the left lung, stage IV ([place]), with liver and brain metastases.  She had disease progression noted on recent CT imaging with increased liver metastasis.  She will begin immunotherapy with Keytruda, planned to begin 03/02/17.  She will continue close follow up with Dr. Julien Nordmann and we will follow her through correspondence with medical oncology. We are happy to continue to participate in her care and management as needed.  She knows to call with any questions or concerns related to her radiotherapy. 2. Brain mets. Recent post-treatment MRI 02/06/17 shows resolution of the suspected area of infarct on previous MRI and improvement of all treated lesions.  No new lesions were noted.  We will repeat MRI  brain in 3 months and plan to present her case at multidisciplinary conference.  She will follow up to review results. 3. Sinus infection.  Appears to be resolved with completion of recent Z-pak.   Nicholos Johns, PA-C

## 2017-02-28 NOTE — Addendum Note (Signed)
Encounter addended by: Malena Edman, RN on: 02/28/2017  1:25 PM<BR>    Actions taken: Charge Capture section accepted

## 2017-03-02 ENCOUNTER — Other Ambulatory Visit (HOSPITAL_BASED_OUTPATIENT_CLINIC_OR_DEPARTMENT_OTHER): Payer: BLUE CROSS/BLUE SHIELD

## 2017-03-02 ENCOUNTER — Other Ambulatory Visit: Payer: BLUE CROSS/BLUE SHIELD

## 2017-03-02 ENCOUNTER — Ambulatory Visit (HOSPITAL_BASED_OUTPATIENT_CLINIC_OR_DEPARTMENT_OTHER): Payer: BLUE CROSS/BLUE SHIELD

## 2017-03-02 VITALS — BP 114/74 | HR 85 | Temp 98.5°F | Resp 16

## 2017-03-02 DIAGNOSIS — Z79899 Other long term (current) drug therapy: Secondary | ICD-10-CM | POA: Diagnosis not present

## 2017-03-02 DIAGNOSIS — I10 Essential (primary) hypertension: Secondary | ICD-10-CM | POA: Diagnosis not present

## 2017-03-02 DIAGNOSIS — C3492 Malignant neoplasm of unspecified part of left bronchus or lung: Secondary | ICD-10-CM

## 2017-03-02 DIAGNOSIS — Z5112 Encounter for antineoplastic immunotherapy: Secondary | ICD-10-CM

## 2017-03-02 DIAGNOSIS — D729 Disorder of white blood cells, unspecified: Secondary | ICD-10-CM | POA: Diagnosis not present

## 2017-03-02 LAB — CBC WITH DIFFERENTIAL/PLATELET
BASO%: 0.7 % (ref 0.0–2.0)
BASOS ABS: 0.1 10*3/uL (ref 0.0–0.1)
EOS ABS: 0.1 10*3/uL (ref 0.0–0.5)
EOS%: 1.6 % (ref 0.0–7.0)
HEMATOCRIT: 30.2 % — AB (ref 34.8–46.6)
HGB: 10.5 g/dL — ABNORMAL LOW (ref 11.6–15.9)
LYMPH#: 1.2 10*3/uL (ref 0.9–3.3)
LYMPH%: 13.8 % — ABNORMAL LOW (ref 14.0–49.7)
MCH: 31.3 pg (ref 25.1–34.0)
MCHC: 34.8 g/dL (ref 31.5–36.0)
MCV: 90 fL (ref 79.5–101.0)
MONO#: 0.9 10*3/uL (ref 0.1–0.9)
MONO%: 9.7 % (ref 0.0–14.0)
NEUT#: 6.6 10*3/uL — ABNORMAL HIGH (ref 1.5–6.5)
NEUT%: 74.2 % (ref 38.4–76.8)
PLATELETS: 364 10*3/uL (ref 145–400)
RBC: 3.36 10*6/uL — ABNORMAL LOW (ref 3.70–5.45)
RDW: 25.1 % — ABNORMAL HIGH (ref 11.2–14.5)
WBC: 9 10*3/uL (ref 3.9–10.3)

## 2017-03-02 LAB — COMPREHENSIVE METABOLIC PANEL
ALK PHOS: 98 U/L (ref 40–150)
ALT: 11 U/L (ref 0–55)
ANION GAP: 14 meq/L — AB (ref 3–11)
AST: 16 U/L (ref 5–34)
Albumin: 3.3 g/dL — ABNORMAL LOW (ref 3.5–5.0)
BUN: 15.4 mg/dL (ref 7.0–26.0)
CALCIUM: 9.4 mg/dL (ref 8.4–10.4)
CHLORIDE: 108 meq/L (ref 98–109)
CO2: 19 mEq/L — ABNORMAL LOW (ref 22–29)
CREATININE: 1.1 mg/dL (ref 0.6–1.1)
EGFR: 53 mL/min/{1.73_m2} — ABNORMAL LOW (ref >90)
Glucose: 174 mg/dl — ABNORMAL HIGH (ref 70–140)
POTASSIUM: 3.8 meq/L (ref 3.5–5.1)
Sodium: 141 mEq/L (ref 136–145)
Total Bilirubin: 0.3 mg/dL (ref 0.20–1.20)
Total Protein: 7 g/dL (ref 6.4–8.3)

## 2017-03-02 LAB — TECHNOLOGIST REVIEW

## 2017-03-02 LAB — TSH: TSH: 1.462 m[IU]/L (ref 0.308–3.960)

## 2017-03-02 MED ORDER — PEMBROLIZUMAB CHEMO INJECTION 100 MG/4ML
200.0000 mg | Freq: Once | INTRAVENOUS | Status: AC
  Administered 2017-03-02: 200 mg via INTRAVENOUS
  Filled 2017-03-02: qty 8

## 2017-03-02 MED ORDER — SODIUM CHLORIDE 0.9 % IV SOLN
Freq: Once | INTRAVENOUS | Status: AC
  Administered 2017-03-02: 13:00:00 via INTRAVENOUS

## 2017-03-02 NOTE — Patient Instructions (Signed)
[facility] Discharge Instructions for Patients Receiving Chemotherapy  Today you received the following chemotherapy agents: Keytruda   To help prevent nausea and vomiting after your treatment, we encourage you to take your nausea medication as directed    If you develop nausea and vomiting that is not controlled by your nausea medication, call the clinic.   BELOW ARE SYMPTOMS THAT SHOULD BE REPORTED IMMEDIATELY:  *FEVER GREATER THAN 100.5 F  *CHILLS WITH OR WITHOUT FEVER  NAUSEA AND VOMITING THAT IS NOT CONTROLLED WITH YOUR NAUSEA MEDICATION  *UNUSUAL SHORTNESS OF BREATH  *UNUSUAL BRUISING OR BLEEDING  TENDERNESS IN MOUTH AND THROAT WITH OR WITHOUT PRESENCE OF ULCERS  *URINARY PROBLEMS  *BOWEL PROBLEMS  UNUSUAL RASH Items with * indicate a potential emergency and should be followed up as soon as possible.  Feel free to call the clinic you have any questions or concerns. The clinic phone number is [phone].  Please show the CHEMO ALERT CARD at check-in to the Emergency Department and triage nurse.   

## 2017-03-06 ENCOUNTER — Telehealth: Payer: Self-pay | Admitting: Medical Oncology

## 2017-03-06 NOTE — Telephone Encounter (Signed)
Feeling fine . Is there anything I should do or not do since I have bone lesions. I told her no sudden twisting , no heavy lifting over 10 lbs.

## 2017-03-09 ENCOUNTER — Other Ambulatory Visit: Payer: BLUE CROSS/BLUE SHIELD

## 2017-03-09 DIAGNOSIS — E7800 Pure hypercholesterolemia, unspecified: Secondary | ICD-10-CM | POA: Diagnosis not present

## 2017-03-09 DIAGNOSIS — I10 Essential (primary) hypertension: Secondary | ICD-10-CM | POA: Diagnosis not present

## 2017-03-09 DIAGNOSIS — R739 Hyperglycemia, unspecified: Secondary | ICD-10-CM | POA: Diagnosis not present

## 2017-03-10 ENCOUNTER — Telehealth: Payer: Self-pay | Admitting: Medical Oncology

## 2017-03-10 NOTE — Telephone Encounter (Signed)
Can she resume her vitamins , asp and fish oil ( VASCEPA-from PCP) -they were stopped for biopsy which was over a month ago.I told her she can resume these meds.

## 2017-03-12 NOTE — Telephone Encounter (Signed)
Yes

## 2017-03-13 NOTE — Telephone Encounter (Signed)
Called pt and advised she can restart her vitamins. Pt thanked me for the call. No further concerns.

## 2017-03-15 ENCOUNTER — Telehealth: Payer: Self-pay | Admitting: Medical Oncology

## 2017-03-15 NOTE — Telephone Encounter (Signed)
RUQ pain started yesterday . Tender to touch and pain elicited when she sneezes, coughs , blows nose. She feels fine otherwise. She is concerned it may be her liver.Per Julien Nordmann she can take her pain med and will evaluate at next appt.

## 2017-03-15 NOTE — Telephone Encounter (Addendum)
I called pt with this information and instructed her to call us for nausea ,  increased pain or uncontrolled pain.

## 2017-03-16 ENCOUNTER — Ambulatory Visit: Payer: BLUE CROSS/BLUE SHIELD

## 2017-03-16 ENCOUNTER — Other Ambulatory Visit: Payer: BLUE CROSS/BLUE SHIELD

## 2017-03-16 ENCOUNTER — Encounter: Payer: BLUE CROSS/BLUE SHIELD | Admitting: Nutrition

## 2017-03-16 ENCOUNTER — Ambulatory Visit: Payer: BLUE CROSS/BLUE SHIELD | Admitting: Internal Medicine

## 2017-03-23 ENCOUNTER — Ambulatory Visit: Payer: BLUE CROSS/BLUE SHIELD | Admitting: Nutrition

## 2017-03-23 ENCOUNTER — Ambulatory Visit (HOSPITAL_BASED_OUTPATIENT_CLINIC_OR_DEPARTMENT_OTHER): Payer: BLUE CROSS/BLUE SHIELD

## 2017-03-23 ENCOUNTER — Other Ambulatory Visit (HOSPITAL_BASED_OUTPATIENT_CLINIC_OR_DEPARTMENT_OTHER): Payer: BLUE CROSS/BLUE SHIELD

## 2017-03-23 ENCOUNTER — Ambulatory Visit (HOSPITAL_BASED_OUTPATIENT_CLINIC_OR_DEPARTMENT_OTHER): Payer: BLUE CROSS/BLUE SHIELD | Admitting: Internal Medicine

## 2017-03-23 ENCOUNTER — Telehealth: Payer: Self-pay | Admitting: Internal Medicine

## 2017-03-23 VITALS — BP 104/83 | HR 80 | Temp 98.9°F | Resp 17 | Ht 64.0 in | Wt 141.8 lb

## 2017-03-23 DIAGNOSIS — C7970 Secondary malignant neoplasm of unspecified adrenal gland: Secondary | ICD-10-CM

## 2017-03-23 DIAGNOSIS — C3412 Malignant neoplasm of upper lobe, left bronchus or lung: Secondary | ICD-10-CM | POA: Diagnosis not present

## 2017-03-23 DIAGNOSIS — C7931 Secondary malignant neoplasm of brain: Secondary | ICD-10-CM

## 2017-03-23 DIAGNOSIS — C3492 Malignant neoplasm of unspecified part of left bronchus or lung: Secondary | ICD-10-CM

## 2017-03-23 DIAGNOSIS — C7951 Secondary malignant neoplasm of bone: Secondary | ICD-10-CM

## 2017-03-23 DIAGNOSIS — Z5112 Encounter for antineoplastic immunotherapy: Secondary | ICD-10-CM | POA: Diagnosis not present

## 2017-03-23 DIAGNOSIS — C787 Secondary malignant neoplasm of liver and intrahepatic bile duct: Secondary | ICD-10-CM

## 2017-03-23 DIAGNOSIS — Z79899 Other long term (current) drug therapy: Secondary | ICD-10-CM

## 2017-03-23 LAB — COMPREHENSIVE METABOLIC PANEL
ALT: 13 U/L (ref 0–55)
ANION GAP: 9 meq/L (ref 3–11)
AST: 18 U/L (ref 5–34)
Albumin: 3.5 g/dL (ref 3.5–5.0)
Alkaline Phosphatase: 87 U/L (ref 40–150)
BUN: 15.6 mg/dL (ref 7.0–26.0)
CALCIUM: 9.5 mg/dL (ref 8.4–10.4)
CHLORIDE: 108 meq/L (ref 98–109)
CO2: 24 mEq/L (ref 22–29)
Creatinine: 1 mg/dL (ref 0.6–1.1)
EGFR: 61 mL/min/{1.73_m2} — AB (ref >90)
Glucose: 85 mg/dl (ref 70–140)
POTASSIUM: 4.3 meq/L (ref 3.5–5.1)
Sodium: 141 mEq/L (ref 136–145)
Total Bilirubin: 0.31 mg/dL (ref 0.20–1.20)
Total Protein: 7.3 g/dL (ref 6.4–8.3)

## 2017-03-23 LAB — CBC WITH DIFFERENTIAL/PLATELET
BASO%: 0.7 % (ref 0.0–2.0)
BASOS ABS: 0.1 10*3/uL (ref 0.0–0.1)
EOS%: 6.2 % (ref 0.0–7.0)
Eosinophils Absolute: 0.5 10*3/uL (ref 0.0–0.5)
HEMATOCRIT: 32.7 % — AB (ref 34.8–46.6)
HGB: 11.2 g/dL — ABNORMAL LOW (ref 11.6–15.9)
LYMPH#: 1.1 10*3/uL (ref 0.9–3.3)
LYMPH%: 12.5 % — AB (ref 14.0–49.7)
MCH: 32 pg (ref 25.1–34.0)
MCHC: 34.1 g/dL (ref 31.5–36.0)
MCV: 93.8 fL (ref 79.5–101.0)
MONO#: 1.2 10*3/uL — AB (ref 0.1–0.9)
MONO%: 14.2 % — ABNORMAL HIGH (ref 0.0–14.0)
NEUT#: 5.6 10*3/uL (ref 1.5–6.5)
NEUT%: 66.4 % (ref 38.4–76.8)
PLATELETS: 278 10*3/uL (ref 145–400)
RBC: 3.48 10*6/uL — ABNORMAL LOW (ref 3.70–5.45)
RDW: 23.1 % — ABNORMAL HIGH (ref 11.2–14.5)
WBC: 8.5 10*3/uL (ref 3.9–10.3)

## 2017-03-23 LAB — UA PROTEIN, DIPSTICK - CHCC: PROTEIN: NEGATIVE mg/dL

## 2017-03-23 LAB — TECHNOLOGIST REVIEW

## 2017-03-23 LAB — TSH: TSH: 1.312 m(IU)/L (ref 0.308–3.960)

## 2017-03-23 MED ORDER — SODIUM CHLORIDE 0.9 % IV SOLN
200.0000 mg | Freq: Once | INTRAVENOUS | Status: AC
  Administered 2017-03-23: 200 mg via INTRAVENOUS
  Filled 2017-03-23: qty 8

## 2017-03-23 MED ORDER — SODIUM CHLORIDE 0.9 % IV SOLN
Freq: Once | INTRAVENOUS | Status: AC
  Administered 2017-03-23: 13:00:00 via INTRAVENOUS

## 2017-03-23 NOTE — Telephone Encounter (Signed)
Scheduled appt per 5/10 - added additional appts per treatment plan . Patient still in treatment area and will get a print out after treatment .

## 2017-03-23 NOTE — Progress Notes (Signed)
Nutrition follow-up completed with patient receiving chemotherapy for stage IV lung cancer. Weight improved documented as 141.8 pounds on May 10 increased from 139.8 pounds March 22. She denies nutrition impact symptoms. Reports appetite has improved.  Nutrition diagnosis: Unintended weight loss improved.  Intervention: Educated patient to continue high-calorie high-protein foods in small frequent meals and snacks. Questions were answered.  Teach back method used.  Monitoring, evaluation, goals Patient will work to increase calories and protein to promote weight maintenance.  No follow-up scheduled.  Patient has my contact information for questions or concerns.  **Disclaimer: This note was dictated with voice recognition software. Similar sounding words can inadvertently be transcribed and this note may contain transcription errors which may not have been corrected upon publication of note.**

## 2017-03-23 NOTE — Progress Notes (Signed)
[place] [phone]   [phone]) [phone]  OFFICE PROGRESS NOTE  [name], MD [address] [place] [address]  DIAGNOSIS: Stage IVb (T1b, N2, M1c) non-small cell lung cancer, adenocarcinoma in a never smoker patient diagnosed in January 2018 and presented with left upper lobe lung mass, mediastinal lymphadenopathy as well as metastatic disease to the bone, liver, adrenal glands as well as abdominal wall nodules and brain metastasis. PDL 1 expression 70%.  Genomic Alterations Identified? KRAS G12V NOTCH2 rearrangement exon 27 OE32 splice site 122+[id number]>G Additional Findings? Microsatellite status MS-Stable Tumor Mutation Burden TMB-Intermediate; 8 Muts/Mb Additional Disease-relevant Genes with No Reportable Alterations Identified? EGFR ALK BRAF MET RET ERBB2 ROS1  PRIOR THERAPY:  1) Palliative radiotherapy to the chest and sacrum under the care of Dr. Tammi Klippel. 2)  Systemic chemotherapy with carboplatin for AUC of 5, Alimta 500 MG/M2 and Avastin 15 MG/KG every 3 weeks. Status post 3 cycles. First dose was given 12/22/2016. Last cycle was given on 02/02/2017, discontinued secondary to disease progression.  CURRENT THERAPY: Treatment with immunotherapy with Ketruda (pembrolizumab) 200 MG IV every 3 weeks. First dose 03/02/2017. Status post one cycle.  INTERVAL HISTORY: Karen Vasquez 66 y.o. female returns to the clinic today for follow-up visit accompanied by a family member. The patient is feeling fine today with no specific complaints. She tolerated the first cycle of her treatment with immunotherapy with Nat Math (pembrolizumab) fairly well. She denied having any significant skin rash or diarrhea. She has no nausea, vomiting or constipation. She denied having any chest pain, shortness of breath, cough or hemoptysis. She has no fever or chills. She is here today for evaluation before starting cycle #2.  MEDICAL HISTORY: Past  Medical History:  Diagnosis Date  . Adenocarcinoma of left lung, stage 4 ([place]) 12/16/2016  . Arthritis   . Asthma due to seasonal allergies   . Chronic fatigue 02/23/2017  . Encounter for antineoplastic chemotherapy 12/17/2016  . GERD (gastroesophageal reflux disease)   . Goals of care, counseling/discussion 12/17/2016  . Hernia of abdominal wall    umbilical  . Hypertension 01/09/2017  . No pertinent past medical history    COLD UPPER RESP STARTED ZPAK 6/14  . Recurrent upper respiratory infection (URI)    recent sinus inf  . Sinus infection 02/25/2017  . Thyroid nodule   . Urine blood    hematuria    ALLERGIES:  is allergic to prednisone; fenofibrate; quinolones; statins; and septra [sulfamethoxazole-trimethoprim].  MEDICATIONS:  Current Outpatient Prescriptions  Medication Sig Dispense Refill  . azithromycin (ZITHROMAX) 250 MG tablet Use as instructed 6 each 0  . Cholecalciferol (VITAMIN D) 2000 units tablet Take 2,000 Units by mouth daily.    Marland Kitchen conjugated estrogens (PREMARIN) vaginal cream Place 1 Applicatorful vaginally daily.    . Cranberry 500 MG TABS Take 1 tablet by mouth 2 (two) times daily.    Marland Kitchen ezetimibe (ZETIA) 10 MG tablet Take 10 mg by mouth daily.    . folic acid (FOLVITE) 1 MG tablet Take 1 tablet (1 mg total) by mouth daily. 30 tablet 4  . ketotifen (ZADITOR) 0.025 % ophthalmic solution Place 1 drop into both eyes 2 (two) times daily.    Marland Kitchen loratadine (CLARITIN) 10 MG tablet Take 10 mg by mouth daily.    Marland Kitchen LORazepam (ATIVAN) 0.5 MG tablet Take 1 tablet (0.5 mg total) by mouth every 8 (eight) hours. 30 tablet 0  . LORazepam (ATIVAN) 0.5 MG tablet  1 tablet po 30 minutes prior to radiation or MRI (Patient not taking: Reported on 02/28/2017) 30 tablet 0  . Multiple Vitamin (MULTIVITAMIN) tablet Take 1 tablet by mouth daily.    Marland Kitchen OLANZapine (ZYPREXA) 10 MG tablet Take 1 tablet (10 mg total) by mouth at bedtime. For Nausea (Patient not taking: Reported on 02/28/2017) 30 tablet 1   . ondansetron (ZOFRAN) 8 MG tablet Take 1 tablet (8 mg total) by mouth every 8 (eight) hours as needed for nausea or vomiting. (Patient not taking: Reported on 02/08/2017) 30 tablet 0  . oxyCODONE (OXY IR/ROXICODONE) 5 MG immediate release tablet Take 1-3 tablets (5-15 mg total) by mouth every 4 (four) hours as needed for severe pain. 100 tablet 0  . pantoprazole (PROTONIX) 40 MG tablet Take 40 mg by mouth 2 (two) times daily.     . prochlorperazine (COMPAZINE) 10 MG tablet TAKE ONE TABLET BY MOUTH EVERY 6 HOURS AS NEEDED FOR NAUSEA/VOMITING (Patient not taking: Reported on 02/08/2017) 30 tablet 0  . raloxifene (EVISTA) 60 MG tablet Take 60 mg by mouth daily.    . sodium chloride (OCEAN) 0.65 % nasal spray Place 1 spray into the nose daily as needed for congestion.     . sucralfate (CARAFATE) 1 g tablet Take 1 tablet (1 g total) by mouth 4 (four) times daily -  with meals and at bedtime. 5 minutes before food (Patient not taking: Reported on 02/08/2017) 120 tablet 5  . trimethoprim (TRIMPEX) 100 MG tablet Take 100 mg by mouth daily.      No current facility-administered medications for this visit.     SURGICAL HISTORY:  Past Surgical History:  Procedure Laterality Date  . ABDOMINAL HYSTERECTOMY  08   vag, rectocele repair , bladder sling  . ANTERIOR CERVICAL DECOMP/DISCECTOMY FUSION  05/09/2012   Procedure: ANTERIOR CERVICAL DECOMPRESSION/DISCECTOMY FUSION 1 LEVEL;  Surgeon: Eustace Moore, MD;  Location: [facility];  Service: Neurosurgery;  Laterality: N/A;  anterior cervical decompression fusion cervical five-six  . BREAST CYST EXCISION  96   fibroadenoma lft  . BREAST SURGERY     03  . CARPAL TUNNEL RELEASE  05   bil  . DILATION AND CURETTAGE OF UTERUS  03    polyps  . TUBAL LIGATION  88    REVIEW OF SYSTEMS:  A comprehensive review of systems was negative.   PHYSICAL EXAMINATION: General appearance: alert, cooperative and no distress Head: Normocephalic, without obvious  abnormality, atraumatic Neck: no adenopathy, no JVD, supple, symmetrical, trachea midline and thyroid not enlarged, symmetric, no tenderness/mass/nodules Lymph nodes: Cervical, supraclavicular, and axillary nodes normal. Resp: clear to auscultation bilaterally Back: symmetric, no curvature. ROM normal. No CVA tenderness. Cardio: regular rate and rhythm, S1, S2 normal, no murmur, click, rub or gallop GI: soft, non-tender; bowel sounds normal; no masses,  no organomegaly Extremities: extremities normal, atraumatic, no cyanosis or edema  ECOG PERFORMANCE STATUS: 1 - Symptomatic but completely ambulatory  Blood pressure 104/83, pulse 80, temperature 98.9 F (37.2 C), temperature source Oral, resp. rate 17, height 5' 4"  (1.626 m), weight 141 lb 12.8 oz (64.3 kg), SpO2 99 %.  LABORATORY DATA: Lab Results  Component Value Date   WBC 8.5 03/23/2017   HGB 11.2 (L) 03/23/2017   HCT 32.7 (L) 03/23/2017   MCV 93.8 03/23/2017   PLT 278 03/23/2017      Chemistry      Component Value Date/Time   NA 141 03/23/2017 1051   K 4.3 03/23/2017 1051  CL 101 05/01/2012 0927   CO2 24 03/23/2017 1051   BUN 15.6 03/23/2017 1051   CREATININE 1.0 03/23/2017 1051      Component Value Date/Time   CALCIUM 9.5 03/23/2017 1051   ALKPHOS 87 03/23/2017 1051   AST 18 03/23/2017 1051   ALT 13 03/23/2017 1051   BILITOT 0.31 03/23/2017 1051       RADIOGRAPHIC STUDIES: No results found.  ASSESSMENT AND PLAN:  This is a very pleasant 66 years old white female with a stage IV non-small cell lung cancer and negative EGFR, ALK, ROS 1 as well as BRAF mutation. PDL 1 expression was 70% She is currently undergoing second line treatment with immunotherapy with Ketruda (pembrolizumab) status post 1 cycle and tolerated the first cycle of her treatment well with no significant adverse effects. Her lab work is unremarkable for any concerning abnormalities. I recommended for the patient to proceed with cycle #2 today  as scheduled. I would see her back for follow-up visit in 3 weeks for evaluation before starting cycle #3. The patient was advised to call immediately if she has any concerning symptoms in the interval. The patient voices understanding of current disease status and treatment options and is in agreement with the current care plan. All questions were answered. The patient knows to call the clinic with any problems, questions or concerns. We can certainly see the patient much sooner if necessary. I spent 10 minutes counseling the patient face to face. The total time spent in the appointment was 15 minutes. Disclaimer: This note was dictated with voice recognition software. Similar sounding words can inadvertently be transcribed and may not be corrected upon review.

## 2017-03-23 NOTE — Patient Instructions (Signed)
[facility] Discharge Instructions for Patients Receiving Chemotherapy  Today you received the following chemotherapy agents: Keytruda   To help prevent nausea and vomiting after your treatment, we encourage you to take your nausea medication as directed    If you develop nausea and vomiting that is not controlled by your nausea medication, call the clinic.   BELOW ARE SYMPTOMS THAT SHOULD BE REPORTED IMMEDIATELY:  *FEVER GREATER THAN 100.5 F  *CHILLS WITH OR WITHOUT FEVER  NAUSEA AND VOMITING THAT IS NOT CONTROLLED WITH YOUR NAUSEA MEDICATION  *UNUSUAL SHORTNESS OF BREATH  *UNUSUAL BRUISING OR BLEEDING  TENDERNESS IN MOUTH AND THROAT WITH OR WITHOUT PRESENCE OF ULCERS  *URINARY PROBLEMS  *BOWEL PROBLEMS  UNUSUAL RASH Items with * indicate a potential emergency and should be followed up as soon as possible.  Feel free to call the clinic you have any questions or concerns. The clinic phone number is [phone].  Please show the CHEMO ALERT CARD at check-in to the Emergency Department and triage nurse.   

## 2017-03-24 ENCOUNTER — Encounter: Payer: Self-pay | Admitting: Internal Medicine

## 2017-03-29 ENCOUNTER — Telehealth: Payer: Self-pay | Admitting: *Deleted

## 2017-03-29 ENCOUNTER — Encounter: Payer: Self-pay | Admitting: Medical Oncology

## 2017-03-29 ENCOUNTER — Encounter: Payer: Self-pay | Admitting: Internal Medicine

## 2017-03-30 ENCOUNTER — Other Ambulatory Visit: Payer: Self-pay | Admitting: Radiation Therapy

## 2017-03-30 DIAGNOSIS — C7931 Secondary malignant neoplasm of brain: Secondary | ICD-10-CM

## 2017-04-06 DIAGNOSIS — Z1211 Encounter for screening for malignant neoplasm of colon: Secondary | ICD-10-CM | POA: Diagnosis not present

## 2017-04-06 DIAGNOSIS — R634 Abnormal weight loss: Secondary | ICD-10-CM | POA: Diagnosis not present

## 2017-04-11 NOTE — Telephone Encounter (Signed)
error 

## 2017-04-13 ENCOUNTER — Ambulatory Visit: Payer: Self-pay | Admitting: Urology

## 2017-04-13 ENCOUNTER — Encounter: Payer: Self-pay | Admitting: Internal Medicine

## 2017-04-13 ENCOUNTER — Ambulatory Visit (HOSPITAL_BASED_OUTPATIENT_CLINIC_OR_DEPARTMENT_OTHER): Payer: BLUE CROSS/BLUE SHIELD | Admitting: Internal Medicine

## 2017-04-13 ENCOUNTER — Telehealth: Payer: Self-pay | Admitting: Internal Medicine

## 2017-04-13 ENCOUNTER — Ambulatory Visit (HOSPITAL_BASED_OUTPATIENT_CLINIC_OR_DEPARTMENT_OTHER): Payer: BLUE CROSS/BLUE SHIELD

## 2017-04-13 ENCOUNTER — Encounter: Payer: BLUE CROSS/BLUE SHIELD | Admitting: Nutrition

## 2017-04-13 ENCOUNTER — Other Ambulatory Visit (HOSPITAL_BASED_OUTPATIENT_CLINIC_OR_DEPARTMENT_OTHER): Payer: BLUE CROSS/BLUE SHIELD

## 2017-04-13 VITALS — BP 131/63 | HR 86 | Temp 98.2°F | Resp 18 | Ht 64.0 in | Wt 143.8 lb

## 2017-04-13 DIAGNOSIS — C3492 Malignant neoplasm of unspecified part of left bronchus or lung: Secondary | ICD-10-CM

## 2017-04-13 DIAGNOSIS — Z79899 Other long term (current) drug therapy: Secondary | ICD-10-CM | POA: Diagnosis not present

## 2017-04-13 DIAGNOSIS — C3412 Malignant neoplasm of upper lobe, left bronchus or lung: Secondary | ICD-10-CM

## 2017-04-13 DIAGNOSIS — Z5112 Encounter for antineoplastic immunotherapy: Secondary | ICD-10-CM | POA: Diagnosis not present

## 2017-04-13 DIAGNOSIS — C7951 Secondary malignant neoplasm of bone: Secondary | ICD-10-CM | POA: Diagnosis not present

## 2017-04-13 DIAGNOSIS — C787 Secondary malignant neoplasm of liver and intrahepatic bile duct: Secondary | ICD-10-CM | POA: Diagnosis not present

## 2017-04-13 DIAGNOSIS — C7970 Secondary malignant neoplasm of unspecified adrenal gland: Secondary | ICD-10-CM

## 2017-04-13 DIAGNOSIS — C7931 Secondary malignant neoplasm of brain: Secondary | ICD-10-CM | POA: Diagnosis not present

## 2017-04-13 LAB — CBC WITH DIFFERENTIAL/PLATELET
BASO%: 0.3 % (ref 0.0–2.0)
BASOS ABS: 0 10*3/uL (ref 0.0–0.1)
EOS ABS: 0.6 10*3/uL — AB (ref 0.0–0.5)
EOS%: 5.3 % (ref 0.0–7.0)
HCT: 36 % (ref 34.8–46.6)
HGB: 12.3 g/dL (ref 11.6–15.9)
LYMPH%: 13.5 % — AB (ref 14.0–49.7)
MCH: 32.6 pg (ref 25.1–34.0)
MCHC: 34.2 g/dL (ref 31.5–36.0)
MCV: 95.5 fL (ref 79.5–101.0)
MONO#: 1.1 10*3/uL — ABNORMAL HIGH (ref 0.1–0.9)
MONO%: 10.6 % (ref 0.0–14.0)
NEUT#: 7.6 10*3/uL — ABNORMAL HIGH (ref 1.5–6.5)
NEUT%: 70.3 % (ref 38.4–76.8)
Platelets: 273 10*3/uL (ref 145–400)
RBC: 3.77 10*6/uL (ref 3.70–5.45)
RDW: 17.5 % — ABNORMAL HIGH (ref 11.2–14.5)
WBC: 10.8 10*3/uL — ABNORMAL HIGH (ref 3.9–10.3)
lymph#: 1.5 10*3/uL (ref 0.9–3.3)

## 2017-04-13 LAB — COMPREHENSIVE METABOLIC PANEL
ALK PHOS: 91 U/L (ref 40–150)
ALT: 13 U/L (ref 0–55)
AST: 20 U/L (ref 5–34)
Albumin: 4 g/dL (ref 3.5–5.0)
Anion Gap: 11 mEq/L (ref 3–11)
BUN: 20.7 mg/dL (ref 7.0–26.0)
CHLORIDE: 107 meq/L (ref 98–109)
CO2: 21 mEq/L — ABNORMAL LOW (ref 22–29)
Calcium: 10 mg/dL (ref 8.4–10.4)
Creatinine: 0.9 mg/dL (ref 0.6–1.1)
EGFR: 64 mL/min/{1.73_m2} — AB (ref >90)
GLUCOSE: 111 mg/dL (ref 70–140)
POTASSIUM: 3.9 meq/L (ref 3.5–5.1)
Sodium: 138 mEq/L (ref 136–145)
Total Bilirubin: 0.33 mg/dL (ref 0.20–1.20)
Total Protein: 7.7 g/dL (ref 6.4–8.3)

## 2017-04-13 LAB — TSH: TSH: 1.263 m(IU)/L (ref 0.308–3.960)

## 2017-04-13 MED ORDER — SODIUM CHLORIDE 0.9 % IV SOLN
200.0000 mg | Freq: Once | INTRAVENOUS | Status: AC
  Administered 2017-04-13: 200 mg via INTRAVENOUS
  Filled 2017-04-13: qty 8

## 2017-04-13 MED ORDER — SODIUM CHLORIDE 0.9 % IV SOLN
Freq: Once | INTRAVENOUS | Status: AC
  Administered 2017-04-13: 13:00:00 via INTRAVENOUS

## 2017-04-13 NOTE — Telephone Encounter (Signed)
Gave patient AVS and calender per 5/31 LOS. - [facility] to contact patient with ct schedule.

## 2017-04-13 NOTE — Patient Instructions (Signed)
[facility] Discharge Instructions for Patients Receiving Chemotherapy  Today you received the following chemotherapy agents: Keytruda   To help prevent nausea and vomiting after your treatment, we encourage you to take your nausea medication as directed    If you develop nausea and vomiting that is not controlled by your nausea medication, call the clinic.   BELOW ARE SYMPTOMS THAT SHOULD BE REPORTED IMMEDIATELY:  *FEVER GREATER THAN 100.5 F  *CHILLS WITH OR WITHOUT FEVER  NAUSEA AND VOMITING THAT IS NOT CONTROLLED WITH YOUR NAUSEA MEDICATION  *UNUSUAL SHORTNESS OF BREATH  *UNUSUAL BRUISING OR BLEEDING  TENDERNESS IN MOUTH AND THROAT WITH OR WITHOUT PRESENCE OF ULCERS  *URINARY PROBLEMS  *BOWEL PROBLEMS  UNUSUAL RASH Items with * indicate a potential emergency and should be followed up as soon as possible.  Feel free to call the clinic you have any questions or concerns. The clinic phone number is [phone].  Please show the CHEMO ALERT CARD at check-in to the Emergency Department and triage nurse.   

## 2017-04-13 NOTE — Progress Notes (Signed)
[place] Telephone:(336) [phone]   Fax:(336) [phone]  OFFICE PROGRESS NOTE  [name], MD [address] [place] [address]  DIAGNOSIS: Stage IVb (T1b, N2, M1c) non-small cell lung cancer, adenocarcinoma in a never smoker patient diagnosed in January 2018 and presented with left upper lobe lung mass, mediastinal lymphadenopathy as well as metastatic disease to the bone, liver, adrenal glands as well as abdominal wall nodules and brain metastasis. PDL 1 expression 70%.  Genomic Alterations Identified? KRAS G12V NOTCH2 rearrangement exon 27 IO96 splice site 295+[id number] Additional Findings? Microsatellite status MS-Stable Tumor Mutation Burden TMB-Intermediate; 8 Muts/Mb Additional Disease-relevant Genes with No Reportable Alterations Identified? EGFR ALK BRAF MET RET ERBB2 ROS1  PRIOR THERAPY:  1) Palliative radiotherapy to the chest and sacrum under the care of Dr. Tammi Klippel. 2)  Systemic chemotherapy with carboplatin for AUC of 5, Alimta 500 MG/M2 and Avastin 15 MG/KG every 3 weeks. Status post 3 cycles. First dose was given 12/22/2016. Last cycle was given on 02/02/2017, discontinued secondary to disease progression.  CURRENT THERAPY: Treatment with immunotherapy with Ketruda (pembrolizumab) 200 MG IV every 3 weeks. First dose 03/02/2017. Status post 2 cycles.  INTERVAL HISTORY: Karen Vasquez 66 y.o. female returns to the clinic today for follow-up visit accompanied by her husband. The patient is feeling fine today with no specific complaints. She is tolerating her current treatment with Ketruda (pembrolizumab) fairly well with no significant adverse effects. She denied having any skin rash or diarrhea. She denied having any chest pain, shortness of breath, cough or hemoptysis. She has no nausea, vomiting or constipation. She is here today for evaluation before starting cycle #3.  MEDICAL HISTORY: Past Medical History:  Diagnosis Date  .  Adenocarcinoma of left lung, stage 4 ([place]) 12/16/2016  . Arthritis   . Asthma due to seasonal allergies   . Chronic fatigue 02/23/2017  . Encounter for antineoplastic chemotherapy 12/17/2016  . GERD (gastroesophageal reflux disease)   . Goals of care, counseling/discussion 12/17/2016  . Hernia of abdominal wall    umbilical  . Hypertension 01/09/2017  . No pertinent past medical history    COLD UPPER RESP STARTED ZPAK 6/14  . Recurrent upper respiratory infection (URI)    recent sinus inf  . Sinus infection 02/25/2017  . Thyroid nodule   . Urine blood    hematuria    ALLERGIES:  is allergic to prednisone; fenofibrate; quinolones; statins; and septra [sulfamethoxazole-trimethoprim].  MEDICATIONS:  Current Outpatient Prescriptions  Medication Sig Dispense Refill  . aspirin EC 81 MG tablet Take 81 mg by mouth daily.    . Cholecalciferol (VITAMIN D) 2000 units tablet Take 2,000 Units by mouth daily.    Marland Kitchen conjugated estrogens (PREMARIN) vaginal cream Place 1 Applicatorful vaginally daily.    . Cranberry 500 MG TABS Take 1 tablet by mouth 2 (two) times daily.    Marland Kitchen ezetimibe (ZETIA) 10 MG tablet Take 10 mg by mouth daily.    . folic acid (FOLVITE) 1 MG tablet Take 1 tablet (1 mg total) by mouth daily. 30 tablet 4  . ketotifen (ZADITOR) 0.025 % ophthalmic solution Place 1 drop into both eyes 2 (two) times daily.    Marland Kitchen loratadine (CLARITIN) 10 MG tablet Take 10 mg by mouth daily.    Marland Kitchen LORazepam (ATIVAN) 0.5 MG tablet Take 1 tablet (0.5 mg total) by mouth every 8 (eight) hours. 30 tablet 0  . LORazepam (ATIVAN) 0.5 MG tablet 1 tablet po 30 minutes  prior to radiation or MRI 30 tablet 0  . Multiple Vitamin (MULTIVITAMIN) tablet Take 1 tablet by mouth daily.    . ondansetron (ZOFRAN) 8 MG tablet Take 1 tablet (8 mg total) by mouth every 8 (eight) hours as needed for nausea or vomiting. 30 tablet 0  . oxyCODONE (OXY IR/ROXICODONE) 5 MG immediate release tablet Take 1-3 tablets (5-15 mg total) by mouth  every 4 (four) hours as needed for severe pain. 100 tablet 0  . pantoprazole (PROTONIX) 40 MG tablet Take 40 mg by mouth 2 (two) times daily.     . prochlorperazine (COMPAZINE) 10 MG tablet TAKE ONE TABLET BY MOUTH EVERY 6 HOURS AS NEEDED FOR NAUSEA/VOMITING 30 tablet 0  . raloxifene (EVISTA) 60 MG tablet Take 60 mg by mouth daily.    . sodium chloride (OCEAN) 0.65 % nasal spray Place 1 spray into the nose daily as needed for congestion.     Marland Kitchen trimethoprim (TRIMPEX) 100 MG tablet Take 100 mg by mouth daily.     Marland Kitchen VASCEPA 1 g CAPS Take 1 tablet by mouth 2 (two) times daily.     No current facility-administered medications for this visit.     SURGICAL HISTORY:  Past Surgical History:  Procedure Laterality Date  . ABDOMINAL HYSTERECTOMY  08   vag, rectocele repair , bladder sling  . ANTERIOR CERVICAL DECOMP/DISCECTOMY FUSION  05/09/2012   Procedure: ANTERIOR CERVICAL DECOMPRESSION/DISCECTOMY FUSION 1 LEVEL;  Surgeon: Eustace Moore, MD;  Location: [facility];  Service: Neurosurgery;  Laterality: N/A;  anterior cervical decompression fusion cervical five-six  . BREAST CYST EXCISION  96   fibroadenoma lft  . BREAST SURGERY     03  . CARPAL TUNNEL RELEASE  05   bil  . DILATION AND CURETTAGE OF UTERUS  03    polyps  . TUBAL LIGATION  88    REVIEW OF SYSTEMS:  A comprehensive review of systems was negative.   PHYSICAL EXAMINATION: General appearance: alert, cooperative and no distress Head: Normocephalic, without obvious abnormality, atraumatic Neck: no adenopathy, no JVD, supple, symmetrical, trachea midline and thyroid not enlarged, symmetric, no tenderness/mass/nodules Lymph nodes: Cervical, supraclavicular, and axillary nodes normal. Resp: clear to auscultation bilaterally Back: symmetric, no curvature. ROM normal. No CVA tenderness. Cardio: regular rate and rhythm, S1, S2 normal, no murmur, click, rub or gallop GI: soft, non-tender; bowel sounds normal; no masses,  no  organomegaly Extremities: extremities normal, atraumatic, no cyanosis or edema  ECOG PERFORMANCE STATUS: 1 - Symptomatic but completely ambulatory  Blood pressure 131/63, pulse 86, temperature 98.2 F (36.8 C), temperature source Oral, resp. rate 18, height 5' 4"  (1.626 m), weight 143 lb 12.8 oz (65.2 kg), SpO2 100 %.  LABORATORY DATA: Lab Results  Component Value Date   WBC 10.8 (H) 04/13/2017   HGB 12.3 04/13/2017   HCT 36.0 04/13/2017   MCV 95.5 04/13/2017   PLT 273 04/13/2017      Chemistry      Component Value Date/Time   NA 141 03/23/2017 1051   K 4.3 03/23/2017 1051   CL 101 05/01/2012 0927   CO2 24 03/23/2017 1051   BUN 15.6 03/23/2017 1051   CREATININE 1.0 03/23/2017 1051      Component Value Date/Time   CALCIUM 9.5 03/23/2017 1051   ALKPHOS 87 03/23/2017 1051   AST 18 03/23/2017 1051   ALT 13 03/23/2017 1051   BILITOT 0.31 03/23/2017 1051       RADIOGRAPHIC STUDIES: No results found.  ASSESSMENT AND PLAN:  This is a very pleasant 66 years old white female with a stage IV non-small cell lung cancer with no actionable mutation and PDL 1 expression 70%. She was initially treated with 3 cycles of systemic chemotherapy with carboplatin, Alimta and Avastin discontinued secondary to disease progression. The patient is currently on second line treatment with Ketruda (pembrolizumab) 200 mg IV every 3 weeks status post 2 cycles. She is tolerating her treatment well with no significant adverse effects. I recommended for the patient to proceed with cycle #3 today as scheduled. She will come back for follow-up visit in 3 weeks for evaluation before starting cycle #4 and after repeating CT scan of the chest, abdomen and pelvis for restaging of her disease. The patient was advised to call immediately if she has any concerning symptoms in the interval. The patient voices understanding of current disease status and treatment options and is in agreement with the current care  plan. All questions were answered. The patient knows to call the clinic with any problems, questions or concerns. We can certainly see the patient much sooner if necessary. I spent 10 minutes counseling the patient face to face. The total time spent in the appointment was 15 minutes. Disclaimer: This note was dictated with voice recognition software. Similar sounding words can inadvertently be transcribed and may not be corrected upon review.

## 2017-04-20 ENCOUNTER — Telehealth: Payer: Self-pay | Admitting: Medical Oncology

## 2017-04-20 ENCOUNTER — Other Ambulatory Visit: Payer: Self-pay | Admitting: Medical Oncology

## 2017-04-20 DIAGNOSIS — I878 Other specified disorders of veins: Secondary | ICD-10-CM

## 2017-04-20 NOTE — Telephone Encounter (Signed)
We can start her on restoril 30 mg po qhs. [place] for mammogram.

## 2017-04-20 NOTE — Telephone Encounter (Signed)
1.Wants port a cath -Order placed for auth.  2.Insomnia-what can she take ? Has not tried  OTC 3. Does she need to get mammogram ( she got a reminder)

## 2017-04-21 ENCOUNTER — Telehealth: Payer: Self-pay | Admitting: Medical Oncology

## 2017-04-21 DIAGNOSIS — G4700 Insomnia, unspecified: Secondary | ICD-10-CM

## 2017-04-21 MED ORDER — TEMAZEPAM 30 MG PO CAPS: 30.0000 mg | ORAL_CAPSULE | Freq: Every evening | ORAL | 0 refills | Status: DC | PRN

## 2017-04-21 NOTE — Telephone Encounter (Signed)
Pt notified that Restoril called to local pharmacy and she can get a mammogram. Port scheduled for Thursday.

## 2017-04-24 DIAGNOSIS — Z1231 Encounter for screening mammogram for malignant neoplasm of breast: Secondary | ICD-10-CM | POA: Diagnosis not present

## 2017-04-25 ENCOUNTER — Telehealth: Payer: Self-pay | Admitting: Medical Oncology

## 2017-04-25 ENCOUNTER — Other Ambulatory Visit: Payer: Self-pay | Admitting: Radiology

## 2017-04-25 NOTE — Telephone Encounter (Signed)
Ct. Scan pending review -pt notified.

## 2017-04-26 ENCOUNTER — Other Ambulatory Visit: Payer: Self-pay | Admitting: Radiology

## 2017-04-27 ENCOUNTER — Encounter (HOSPITAL_COMMUNITY): Payer: Self-pay

## 2017-04-27 ENCOUNTER — Telehealth: Payer: Self-pay | Admitting: Emergency Medicine

## 2017-04-27 ENCOUNTER — Ambulatory Visit (HOSPITAL_COMMUNITY)
Admission: RE | Admit: 2017-04-27 | Discharge: 2017-04-27 | Disposition: A | Discharge status: Home / Self Care | Payer: BLUE CROSS/BLUE SHIELD | Source: Ambulatory Visit | Attending: Internal Medicine | Admitting: Internal Medicine

## 2017-04-27 DIAGNOSIS — Z7982 Long term (current) use of aspirin: Secondary | ICD-10-CM | POA: Diagnosis not present

## 2017-04-27 DIAGNOSIS — K219 Gastro-esophageal reflux disease without esophagitis: Secondary | ICD-10-CM | POA: Insufficient documentation

## 2017-04-27 DIAGNOSIS — D72829 Elevated white blood cell count, unspecified: Secondary | ICD-10-CM | POA: Insufficient documentation

## 2017-04-27 DIAGNOSIS — Z539 Procedure and treatment not carried out, unspecified reason: Secondary | ICD-10-CM | POA: Insufficient documentation

## 2017-04-27 DIAGNOSIS — Z5111 Encounter for antineoplastic chemotherapy: Secondary | ICD-10-CM | POA: Diagnosis not present

## 2017-04-27 DIAGNOSIS — R5382 Chronic fatigue, unspecified: Secondary | ICD-10-CM | POA: Diagnosis not present

## 2017-04-27 DIAGNOSIS — C3412 Malignant neoplasm of upper lobe, left bronchus or lung: Secondary | ICD-10-CM | POA: Diagnosis not present

## 2017-04-27 DIAGNOSIS — I10 Essential (primary) hypertension: Secondary | ICD-10-CM | POA: Insufficient documentation

## 2017-04-27 DIAGNOSIS — C3490 Malignant neoplasm of unspecified part of unspecified bronchus or lung: Secondary | ICD-10-CM | POA: Insufficient documentation

## 2017-04-27 DIAGNOSIS — I878 Other specified disorders of veins: Secondary | ICD-10-CM

## 2017-04-27 DIAGNOSIS — J45909 Unspecified asthma, uncomplicated: Secondary | ICD-10-CM | POA: Diagnosis not present

## 2017-04-27 LAB — CBC WITH DIFFERENTIAL/PLATELET
BASOS PCT: 0 %
Basophils Absolute: 0.1 10*3/uL (ref 0.0–0.1)
Eosinophils Absolute: 0.8 10*3/uL — ABNORMAL HIGH (ref 0.0–0.7)
Eosinophils Relative: 7 %
HEMATOCRIT: 36 % (ref 36.0–46.0)
Hemoglobin: 11.9 g/dL — ABNORMAL LOW (ref 12.0–15.0)
LYMPHS ABS: 1.3 10*3/uL (ref 0.7–4.0)
Lymphocytes Relative: 11 %
MCH: 31.4 pg (ref 26.0–34.0)
MCHC: 33.1 g/dL (ref 30.0–36.0)
MCV: 95 fL (ref 78.0–100.0)
MONOS PCT: 11 %
Monocytes Absolute: 1.3 10*3/uL — ABNORMAL HIGH (ref 0.1–1.0)
NEUTROS ABS: 8.7 10*3/uL — AB (ref 1.7–7.7)
Neutrophils Relative %: 71 %
Platelets: 281 10*3/uL (ref 150–400)
RBC: 3.79 MIL/uL — ABNORMAL LOW (ref 3.87–5.11)
RDW: 15 % (ref 11.5–15.5)
WBC: 12.3 10*3/uL — ABNORMAL HIGH (ref 4.0–10.5)

## 2017-04-27 LAB — PROTIME-INR
INR: 1.06
Prothrombin Time: 13.9 seconds (ref 11.4–15.2)

## 2017-04-27 MED ORDER — CEFAZOLIN SODIUM-DEXTROSE 2-4 GM/100ML-% IV SOLN: 2.0000 g | INTRAVENOUS | Status: DC

## 2017-04-27 MED ORDER — SODIUM CHLORIDE 0.9 % IV SOLN
INTRAVENOUS | Status: DC
  Administered 2017-04-27: 10:00:00 via INTRAVENOUS

## 2017-04-27 NOTE — Telephone Encounter (Signed)
Portacath placement rescheduled to 6/26 due to patient having sinus drainage per IR staff. Will notify Dr Julien Nordmann. Next chemo scheduled 6/21 with office visit prior.   Per IR note;" Labs reviewed, mildly elevated WBC of unknown etiology. D/W Hoss, concern for developing sinus infection given sxs and leukocytosis."

## 2017-04-27 NOTE — H&P (Signed)
Chief Complaint: Patient was seen in consultation today for port placement at the request of [facility]  Referring Physician(s): Mohamed,Mohamed  Supervising Physician: Marybelle Killings  Patient Status: [name] - Out-pt  History of Present Illness: Karen Vasquez is a 66 y.o. female with metastatic lung cancer. She is going to begin another cycle of chemotherapy soon and has had some venous access issues, therefore she is referred for port placement. PMHx, meds, labs, imaging, allergies reviewed. She has been NPO this am Feels well, no recent fevers, chills, illness. Denies CP, cough, N/V, abd pain, diarrhea, dysuria. She does report some sinus congestion, no real drainage though. Brother at bedside.  Past Medical History:  Diagnosis Date  . Adenocarcinoma of left lung, stage 4 ([place]) 12/16/2016  . Arthritis   . Asthma due to seasonal allergies   . Chronic fatigue 02/23/2017  . Encounter for antineoplastic chemotherapy 12/17/2016  . GERD (gastroesophageal reflux disease)   . Goals of care, counseling/discussion 12/17/2016  . Hernia of abdominal wall    umbilical  . Hypertension 01/09/2017  . No pertinent past medical history    COLD UPPER RESP STARTED ZPAK 6/14  . Recurrent upper respiratory infection (URI)    recent sinus inf  . Sinus infection 02/25/2017  . Thyroid nodule   . Urine blood    hematuria    Past Surgical History:  Procedure Laterality Date  . ABDOMINAL HYSTERECTOMY  08   vag, rectocele repair , bladder sling  . ANTERIOR CERVICAL DECOMP/DISCECTOMY FUSION  05/09/2012   Procedure: ANTERIOR CERVICAL DECOMPRESSION/DISCECTOMY FUSION 1 LEVEL;  Surgeon: Eustace Moore, MD;  Location: [facility];  Service: Neurosurgery;  Laterality: N/A;  anterior cervical decompression fusion cervical five-six  . BREAST CYST EXCISION  96   fibroadenoma lft  . BREAST SURGERY     03  . CARPAL TUNNEL RELEASE  05   bil  . DILATION AND CURETTAGE OF UTERUS  03    polyps  . TUBAL  LIGATION  88    Allergies: Prednisone; Fenofibrate; Quinolones; Statins; and Septra [sulfamethoxazole-trimethoprim]  Medications: Prior to Admission medications   Medication Sig Start Date End Date Taking? Authorizing Provider  calcium gluconate 500 MG tablet Take 1 tablet by mouth 2 (two) times daily.   Yes [provider]  Cholecalciferol (VITAMIN D) 2000 units tablet Take 2,000 Units by mouth daily.   Yes [provider]  conjugated estrogens (PREMARIN) vaginal cream Place 1 Applicatorful vaginally daily.   Yes [provider]  Cranberry 500 MG TABS Take 1 tablet by mouth 2 (two) times daily.   Yes [provider]  ezetimibe (ZETIA) 10 MG tablet Take 10 mg by mouth daily.   Yes [provider]  ketotifen (ZADITOR) 0.025 % ophthalmic solution Place 1 drop into both eyes 2 (two) times daily.   Yes [provider]  loratadine (CLARITIN) 10 MG tablet Take 10 mg by mouth daily.   Yes [provider]  LORazepam (ATIVAN) 0.5 MG tablet Take 1 tablet (0.5 mg total) by mouth every 8 (eight) hours. 02/16/17  Yes Curt Bears, MD  LORazepam (ATIVAN) 0.5 MG tablet 1 tablet po 30 minutes prior to radiation or MRI 02/23/17  Yes Curt Bears, MD  Multiple Vitamin (MULTIVITAMIN) tablet Take 1 tablet by mouth daily.   Yes [provider]  oxyCODONE (OXY IR/ROXICODONE) 5 MG immediate release tablet Take 1-3 tablets (5-15 mg total) by mouth every 4 (four) hours as needed for severe pain. 02/02/17  Yes Curt Bears, MD  pantoprazole (PROTONIX) 40 MG tablet Take 40 mg by mouth 2 (two) times daily.    Yes [provider]  raloxifene (EVISTA) 60 MG tablet Take 60 mg by mouth daily.   Yes [provider]  sodium chloride (OCEAN) 0.65 % nasal spray Place 1 spray into the nose daily as needed for congestion.    Yes [provider]  trimethoprim (TRIMPEX) 100 MG tablet Take 100 mg by mouth daily.    Yes [provider]  VASCEPA 1 g CAPS Take 1 tablet by mouth 2 (two) times daily. 03/09/17  Yes [provider]  vitamin C (ASCORBIC ACID) 250 MG tablet Take 250 mg by mouth daily.   Yes [provider]  aspirin EC 81 MG tablet Take 81 mg by mouth daily.    [provider]  folic acid (FOLVITE) 1 MG tablet Take 1 tablet (1 mg total) by mouth daily. 12/16/16   Curt Bears, MD  ondansetron (ZOFRAN) 8 MG tablet Take 1 tablet (8 mg total) by mouth every 8 (eight) hours as needed for nausea or vomiting. 01/16/17   Curt Bears, MD  prochlorperazine (COMPAZINE) 10 MG tablet TAKE ONE TABLET BY MOUTH EVERY 6 HOURS AS NEEDED FOR NAUSEA/VOMITING 01/16/17   Curt Bears, MD  temazepam (RESTORIL) 30 MG capsule Take 1 capsule (30 mg total) by mouth at bedtime as needed for sleep. 04/21/17   Curt Bears, MD     Family History  Problem Relation Age of Onset  . Cancer Mother        breast  . Cancer Father        prostate  . Muscular dystrophy Son     Social History   Social History  . Marital status: Married    Spouse name: N/A  . Number of children: N/A  . Years of education: N/A   Social History Main Topics  . Smoking status: Never Smoker  . Smokeless tobacco: Never Used  . Alcohol use No  . Drug use: No  . Sexual activity: Not Currently   Other Topics Concern  . None   Social History Narrative  . None     Review of Systems: A 12 point ROS discussed and pertinent positives are indicated in the HPI above.  All other systems are negative.  Review of Systems  Vital Signs: T: 98.5, HR: 82, RR: 16, BP: 131/80  Physical Exam  Constitutional: She is oriented to person, place, and time. She appears well-developed and well-nourished. No distress.  HENT:  Head: Normocephalic.  Mouth/Throat: Oropharynx is clear and moist.  Neck: Normal range of motion. No JVD present. No tracheal deviation present.  Cardiovascular: Normal rate, regular rhythm and normal  heart sounds.   Pulmonary/Chest: Effort normal and breath sounds normal. No respiratory distress.  Abdominal: Soft. She exhibits no distension.  Neurological: She is alert and oriented to person, place, and time.  Skin: Skin is dry.  Psychiatric: She has a normal mood and affect. Judgment normal.    Mallampati Score:  MD Evaluation Airway: WNL Heart: WNL Abdomen: WNL Chest/ Lungs: WNL ASA  Classification: 2 Mallampati/Airway Score: One  Imaging: No results found.  Labs:  CBC:  Recent Labs  03/02/17 1104 03/23/17 1051 04/13/17 1118 04/27/17 1002  WBC 9.0 8.5 10.8* 12.3*  HGB 10.5* 11.2* 12.3 11.9*  HCT 30.2* 32.7* 36.0 36.0  PLT 364 278 273 281    COAGS:  Recent Labs  11/21/16 1122 12/08/16 1156  INR 1.12 1.07  APTT 31 32    BMP:  Recent Labs  02/23/17 1057 03/02/17 1104 03/23/17 1051 04/13/17 1118  NA 142 141 141 138  K 4.1 3.8 4.3 3.9  CO2 22 19* 24 21*  GLUCOSE 123 174* 85 111  BUN 14.4 15.4 15.6 20.7  CALCIUM 8.8 9.4 9.5 10.0  CREATININE 1.2* 1.1 1.0 0.9    LIVER FUNCTION TESTS:  Recent Labs  02/23/17 1057 03/02/17 1104 03/23/17 1051 04/13/17 1118  BILITOT 0.30 0.30 0.31 0.33  AST 17 16 18 20   ALT 15 11 13 13   ALKPHOS 100 98 87 91  PROT 6.6 7.0 7.3 7.7  ALBUMIN 3.1* 3.3* 3.5 4.0    TUMOR MARKERS: No results for input(s): AFPTM, CEA, CA199, CHROMGRNA in the last 8760 hours.  Assessment and Plan: Metastatic lung cancer For port placement Labs reviewed, mildly elevated WBC of unknown etiology. D/W Hoss, concern for developing sinus infection given sxs and leukocytosis. Risks and Benefits discussed with the patient including, but not limited to bleeding, infection, pneumothorax, or fibrin sheath development and need for additional procedures. All of the patient's questions were answered, patient is agreeable to proceed. Consent signed and in chart.    Thank you for this interesting consult.  I greatly enjoyed meeting Karen Vasquez and look forward to participating in their care.  A copy of this report was sent to the requesting provider on this date.  Electronically Signed: Ascencion Dike, PA-C 04/27/2017, 10:40 AM   I spent a total of 20 minutes in face to face in clinical consultation, greater than 50% of which was counseling/coordinating care for port placement

## 2017-05-01 ENCOUNTER — Telehealth: Payer: Self-pay | Admitting: *Deleted

## 2017-05-01 NOTE — Telephone Encounter (Signed)
"  My port-a-cath was rescheduled to be placed on 05-09-2017.  I've tried to monitor myself over the weekend to see if I had a sinus infection or UTI because white cell count up.  No symptoms except being very tired.  I'm scheduled for treatment after I see Dr. Julien Nordmann.  Is there anything else I should be doing?  Call me at [phone]."   Called patient who reports "The highest temp = 99.1 Saturday.  I remained normal with lowest temp = 97.8   No sinus problems but going outside I experience coughing.  Code orange alerts started this weekend to last through the week so I'll wear a mask when I go out.  I drink 6 to 9 eight oz water daily.  Last BM today and moved well as always.  Urinating well.  Temazepam used twice is working well.  I'll take this again tonight.  I'm lying around more."  Will notify provider.  Nurse instructions are to continue what she is doing avoiding people who're sick and good handwashing especially after using bathroom.

## 2017-05-03 NOTE — Progress Notes (Signed)
Karen Vasquez 67 y.o. woman with Adenocarcinoma of the left lung, Stage IV ([place]), with liver and brain metastases radiation completed 01-23-17 review 05-08-17 MRI brain w wo contrast one month FU.  Pain: Denies pain Cognitive:Alert and oriented x 3 with fluent speech,able to complete sentences without difficulty with word finding or organization of sentences. 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 Pain difficulty swallowing or feel like there is a lump in their chest feeling is gone.  No problems when she is eating or drinking.   She is very careful while eating and drinking.  Are they taking Carafate? No she is taking Protonix Skin Irritation:Nop problems, Had port-a-cath insertion Tuesday, June 05-09-17 done in IR. Chemotherapy:Systemic chemotherapy with carboplatin for AUC of 5, Alimta 500 MG/M2 and Avastin 15 MG/KG every 3 weeks last dose 01-23-17,  Weight:Eating poor because of the nausea a lot of the time taking Ativan as of today for the nausea, attempting to eat four to five small meals per day.  04-13-17 Saw Dr Julien Nordmann currently on second line treatment with Ketruda (pembrolizumab) 200 mg IV every 3 weeks status post 2 cycles. She is tolerating her treatment well with no significant adverse effects. I recommended for the patient to proceed with cycle #3 today as scheduled. She will come back for follow-up visit in 3 weeks for evaluation before starting cycle #4 and after repeating CT scan of the chest, abdomen and pelvis for restaging of her disease. Weight: Wt Readings from Last 3 Encounters:  05/11/17 145 lb 3.2 oz (65.9 kg)  05/09/17 143 lb 2 oz (64.9 kg)  05/04/17 144 lb 11.2 oz (65.6 kg)   Imaging: 05-08-17 MRI brain w wo contrast,  Having CT chest and abdomen tomorrow 05-12-17 per Dr. Julien Nordmann Lab: 05-09-17 PT INR, CBC w diff BP (!) 108/47   Pulse 97   Temp 98.6 F  (37 C) (Oral)   Resp 16   Ht 5' 4.5" (1.638 m)   Wt 145 lb 3.2 oz (65.9 kg)   SpO2 100%   BMI 24.54 kg/m

## 2017-05-04 ENCOUNTER — Encounter: Payer: Self-pay | Admitting: Internal Medicine

## 2017-05-04 ENCOUNTER — Ambulatory Visit (HOSPITAL_BASED_OUTPATIENT_CLINIC_OR_DEPARTMENT_OTHER): Payer: BLUE CROSS/BLUE SHIELD

## 2017-05-04 ENCOUNTER — Other Ambulatory Visit (HOSPITAL_BASED_OUTPATIENT_CLINIC_OR_DEPARTMENT_OTHER): Payer: BLUE CROSS/BLUE SHIELD

## 2017-05-04 ENCOUNTER — Ambulatory Visit (HOSPITAL_BASED_OUTPATIENT_CLINIC_OR_DEPARTMENT_OTHER): Payer: BLUE CROSS/BLUE SHIELD | Admitting: Internal Medicine

## 2017-05-04 VITALS — BP 133/77 | HR 92 | Temp 99.5°F | Resp 17 | Ht 64.0 in | Wt 144.7 lb

## 2017-05-04 DIAGNOSIS — Z79899 Other long term (current) drug therapy: Secondary | ICD-10-CM | POA: Diagnosis not present

## 2017-05-04 DIAGNOSIS — C7951 Secondary malignant neoplasm of bone: Secondary | ICD-10-CM

## 2017-05-04 DIAGNOSIS — C787 Secondary malignant neoplasm of liver and intrahepatic bile duct: Secondary | ICD-10-CM

## 2017-05-04 DIAGNOSIS — C3412 Malignant neoplasm of upper lobe, left bronchus or lung: Secondary | ICD-10-CM

## 2017-05-04 DIAGNOSIS — C3492 Malignant neoplasm of unspecified part of left bronchus or lung: Secondary | ICD-10-CM

## 2017-05-04 DIAGNOSIS — J329 Chronic sinusitis, unspecified: Secondary | ICD-10-CM | POA: Diagnosis not present

## 2017-05-04 DIAGNOSIS — C7970 Secondary malignant neoplasm of unspecified adrenal gland: Secondary | ICD-10-CM | POA: Diagnosis not present

## 2017-05-04 DIAGNOSIS — Z5112 Encounter for antineoplastic immunotherapy: Secondary | ICD-10-CM

## 2017-05-04 DIAGNOSIS — C7931 Secondary malignant neoplasm of brain: Secondary | ICD-10-CM | POA: Diagnosis not present

## 2017-05-04 LAB — CBC WITH DIFFERENTIAL/PLATELET
BASO%: 0.4 % (ref 0.0–2.0)
BASOS ABS: 0.1 10*3/uL (ref 0.0–0.1)
EOS%: 6.9 % (ref 0.0–7.0)
Eosinophils Absolute: 0.9 10*3/uL — ABNORMAL HIGH (ref 0.0–0.5)
HCT: 37.9 % (ref 34.8–46.6)
HGB: 12.7 g/dL (ref 11.6–15.9)
LYMPH%: 10.5 % — ABNORMAL LOW (ref 14.0–49.7)
MCH: 31.7 pg (ref 25.1–34.0)
MCHC: 33.6 g/dL (ref 31.5–36.0)
MCV: 94.3 fL (ref 79.5–101.0)
MONO#: 1.4 10*3/uL — ABNORMAL HIGH (ref 0.1–0.9)
MONO%: 10 % (ref 0.0–14.0)
NEUT#: 9.8 10*3/uL — ABNORMAL HIGH (ref 1.5–6.5)
NEUT%: 72.2 % (ref 38.4–76.8)
Platelets: 311 10*3/uL (ref 145–400)
RBC: 4.02 10*6/uL (ref 3.70–5.45)
RDW: 15.2 % — ABNORMAL HIGH (ref 11.2–14.5)
WBC: 13.6 10*3/uL — ABNORMAL HIGH (ref 3.9–10.3)
lymph#: 1.4 10*3/uL (ref 0.9–3.3)

## 2017-05-04 LAB — COMPREHENSIVE METABOLIC PANEL
ALT: 17 U/L (ref 0–55)
AST: 19 U/L (ref 5–34)
Albumin: 3.7 g/dL (ref 3.5–5.0)
Alkaline Phosphatase: 97 U/L (ref 40–150)
Anion Gap: 13 mEq/L — ABNORMAL HIGH (ref 3–11)
BUN: 23.9 mg/dL (ref 7.0–26.0)
CALCIUM: 10.3 mg/dL (ref 8.4–10.4)
CHLORIDE: 106 meq/L (ref 98–109)
CO2: 21 mEq/L — ABNORMAL LOW (ref 22–29)
Creatinine: 1.4 mg/dL — ABNORMAL HIGH (ref 0.6–1.1)
EGFR: 40 mL/min/{1.73_m2} — ABNORMAL LOW (ref >90)
GLUCOSE: 97 mg/dL (ref 70–140)
POTASSIUM: 3.7 meq/L (ref 3.5–5.1)
SODIUM: 140 meq/L (ref 136–145)
Total Bilirubin: 0.32 mg/dL (ref 0.20–1.20)
Total Protein: 7.9 g/dL (ref 6.4–8.3)

## 2017-05-04 LAB — TSH: TSH: 1.438 m(IU)/L (ref 0.308–3.960)

## 2017-05-04 MED ORDER — SODIUM CHLORIDE 0.9 % IV SOLN
200.0000 mg | Freq: Once | INTRAVENOUS | Status: AC
  Administered 2017-05-04: 200 mg via INTRAVENOUS
  Filled 2017-05-04: qty 8

## 2017-05-04 MED ORDER — AMOXICILLIN-POT CLAVULANATE 875-125 MG PO TABS: 1.0000 | ORAL_TABLET | Freq: Two times a day (BID) | ORAL | 0 refills | Status: DC

## 2017-05-04 MED ORDER — SODIUM CHLORIDE 0.9 % IV SOLN
Freq: Once | INTRAVENOUS | Status: AC
  Administered 2017-05-04: 12:00:00 via INTRAVENOUS

## 2017-05-04 NOTE — Patient Instructions (Signed)
[facility] Discharge Instructions for Patients Receiving Chemotherapy  Today you received the following chemotherapy agents Keytruda  To help prevent nausea and vomiting after your treatment, we encourage you to take your nausea medication as prescribed.  If you develop nausea and vomiting that is not controlled by your nausea medication, call the clinic.   BELOW ARE SYMPTOMS THAT SHOULD BE REPORTED IMMEDIATELY:  *FEVER GREATER THAN 100.5 F  *CHILLS WITH OR WITHOUT FEVER  NAUSEA AND VOMITING THAT IS NOT CONTROLLED WITH YOUR NAUSEA MEDICATION  *UNUSUAL SHORTNESS OF BREATH  *UNUSUAL BRUISING OR BLEEDING  TENDERNESS IN MOUTH AND THROAT WITH OR WITHOUT PRESENCE OF ULCERS  *URINARY PROBLEMS  *BOWEL PROBLEMS  UNUSUAL RASH Items with * indicate a potential emergency and should be followed up as soon as possible.  Feel free to call the clinic you have any questions or concerns. The clinic phone number is [phone].  Please show the [place] at check-in to the Emergency Department and triage nurse.

## 2017-05-04 NOTE — Progress Notes (Signed)
[place] [phone]   [phone]  OFFICE PROGRESS NOTE  [name], MD [address] [place] [address]  DIAGNOSIS: Stage IVb (T1b, N2, M1c) non-small cell lung cancer, adenocarcinoma in a never smoker patient diagnosed in January 2018 and presented with left upper lobe lung mass, mediastinal lymphadenopathy as well as metastatic disease to the bone, liver, adrenal glands as well as abdominal wall nodules and brain metastasis. PDL 1 expression 70%.  Genomic Alterations Identified? 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 Microsatellite status MS-Stable Tumor Mutation Burden TMB-Intermediate; 8 Muts/Mb Additional Disease-relevant Genes with No Reportable Alterations Identified? EGFR ALK BRAF MET RET ERBB2 ROS1  PRIOR THERAPY:  1) Palliative radiotherapy to the chest and sacrum under the care of Dr. Tammi Klippel. 2)  Systemic chemotherapy with carboplatin for AUC of 5, Alimta 500 MG/M2 and Avastin 15 MG/KG every 3 weeks. Status post 3 cycles. First dose was given 12/22/2016. Last cycle was given on 02/02/2017, discontinued secondary to disease progression.  CURRENT THERAPY: Treatment with immunotherapy with Ketruda (pembrolizumab) 200 MG IV every 3 weeks. First dose 03/02/2017. Status post 3 cycles.  INTERVAL HISTORY: Karen Vasquez 66 y.o. female returns to the clinic today for follow-up visit accompanied by her husband. The patient is currently on treatment with immunotherapy with Ketruda (pembrolizumab) status post 3 cycles. She is tolerating her treatment well with no significant adverse effects. She denied having any chest pain, shortness breath, cough or hemoptysis. She had sinus infection recently but she did not receive any treatment for it. She is currently on trimethoprim for urinary tract infection. She was supposed to have Port-A-Cath placed last week but this was canceled  because of her leukocytosis. The patient denied having any weight loss or night sweats. She has no current fever or chills. She was supposed to have repeat CT scan of the chest, abdomen and pelvis before this visit but unfortunately she has not received any appointment to scheduled her scan event it was ordered 3 weeks ago. She 0 today for evaluation before starting cycle #4.  MEDICAL HISTORY: Past Medical History:  Diagnosis Date  . Adenocarcinoma of left lung, stage 4 ([place]) 12/16/2016  . Arthritis   . Asthma due to seasonal allergies   . Chronic fatigue 02/23/2017  . Encounter for antineoplastic chemotherapy 12/17/2016  . GERD (gastroesophageal reflux disease)   . Goals of care, counseling/discussion 12/17/2016  . Hernia of abdominal wall    umbilical  . Hypertension 01/09/2017  . No pertinent past medical history    COLD UPPER RESP STARTED ZPAK 6/14  . Recurrent upper respiratory infection (URI)    recent sinus inf  . Sinus infection 02/25/2017  . Thyroid nodule   . Urine blood    hematuria    ALLERGIES:  is allergic to prednisone; fenofibrate; quinolones; statins; and septra [sulfamethoxazole-trimethoprim].  MEDICATIONS:  Current Outpatient Prescriptions  Medication Sig Dispense Refill  . aspirin EC 81 MG tablet Take 81 mg by mouth daily.    . calcium gluconate 500 MG tablet Take 1 tablet by mouth 2 (two) times daily.    . Cholecalciferol (VITAMIN D) 2000 units tablet Take 2,000 Units by mouth daily.    Marland Kitchen conjugated estrogens (PREMARIN) vaginal cream Place 1 Applicatorful vaginally daily.    . Cranberry 500 MG TABS Take 1 tablet by mouth 2 (two) times daily.    Marland Kitchen ezetimibe (ZETIA) 10 MG tablet Take 10 mg  by mouth daily.    Marland Kitchen ketotifen (ZADITOR) 0.025 % ophthalmic solution Place 1 drop into both eyes 2 (two) times daily.    Marland Kitchen loratadine (CLARITIN) 10 MG tablet Take 10 mg by mouth daily.    Marland Kitchen LORazepam (ATIVAN) 0.5 MG tablet Take 1 tablet (0.5 mg total) by mouth every 8 (eight) hours.  30 tablet 0  . LORazepam (ATIVAN) 0.5 MG tablet 1 tablet po 30 minutes prior to radiation or MRI 30 tablet 0  . Multiple Vitamin (MULTIVITAMIN) tablet Take 1 tablet by mouth daily.    . ondansetron (ZOFRAN) 8 MG tablet Take 1 tablet (8 mg total) by mouth every 8 (eight) hours as needed for nausea or vomiting. 30 tablet 0  . oxyCODONE (OXY IR/ROXICODONE) 5 MG immediate release tablet Take 1-3 tablets (5-15 mg total) by mouth every 4 (four) hours as needed for severe pain. 100 tablet 0  . pantoprazole (PROTONIX) 40 MG tablet Take 40 mg by mouth 2 (two) times daily.     . prochlorperazine (COMPAZINE) 10 MG tablet TAKE ONE TABLET BY MOUTH EVERY 6 HOURS AS NEEDED FOR NAUSEA/VOMITING 30 tablet 0  . raloxifene (EVISTA) 60 MG tablet Take 60 mg by mouth daily.    . sodium chloride (OCEAN) 0.65 % nasal spray Place 1 spray into the nose daily as needed for congestion.     . temazepam (RESTORIL) 30 MG capsule Take 1 capsule (30 mg total) by mouth at bedtime as needed for sleep. 30 capsule 0  . trimethoprim (TRIMPEX) 100 MG tablet Take 100 mg by mouth daily.     Marland Kitchen VASCEPA 1 g CAPS Take 1 tablet by mouth 2 (two) times daily.    . vitamin C (ASCORBIC ACID) 250 MG tablet Take 250 mg by mouth daily.     No current facility-administered medications for this visit.     SURGICAL HISTORY:  Past Surgical History:  Procedure Laterality Date  . ABDOMINAL HYSTERECTOMY  08   vag, rectocele repair , bladder sling  . ANTERIOR CERVICAL DECOMP/DISCECTOMY FUSION  05/09/2012   Procedure: ANTERIOR CERVICAL DECOMPRESSION/DISCECTOMY FUSION 1 LEVEL;  Surgeon: Eustace Moore, MD;  Location: [facility];  Service: Neurosurgery;  Laterality: N/A;  anterior cervical decompression fusion cervical five-six  . BREAST CYST EXCISION  96   fibroadenoma lft  . BREAST SURGERY     03  . CARPAL TUNNEL RELEASE  05   bil  . DILATION AND CURETTAGE OF UTERUS  03    polyps  . TUBAL LIGATION  88    REVIEW OF SYSTEMS:  Constitutional:  negative Eyes: negative Ears, nose, mouth, throat, and face: positive for Sinus infection Respiratory: negative Cardiovascular: negative Gastrointestinal: negative Genitourinary:negative Integument/breast: negative Hematologic/lymphatic: negative Musculoskeletal:negative Neurological: negative Behavioral/Psych: negative Endocrine: negative Allergic/Immunologic: negative   PHYSICAL EXAMINATION: General appearance: alert, cooperative and no distress Head: Normocephalic, without obvious abnormality, atraumatic Neck: no adenopathy, no JVD, supple, symmetrical, trachea midline and thyroid not enlarged, symmetric, no tenderness/mass/nodules Lymph nodes: Cervical, supraclavicular, and axillary nodes normal. Resp: clear to auscultation bilaterally Back: symmetric, no curvature. ROM normal. No CVA tenderness. Cardio: regular rate and rhythm, S1, S2 normal, no murmur, click, rub or gallop GI: soft, non-tender; bowel sounds normal; no masses,  no organomegaly Extremities: extremities normal, atraumatic, no cyanosis or edema Neurologic: Alert and oriented X 3, normal strength and tone. Normal symmetric reflexes. Normal coordination and gait  ECOG PERFORMANCE STATUS: 1 - Symptomatic but completely ambulatory  Blood pressure 133/77, pulse 92, temperature 99.5  F (37.5 C), temperature source Oral, resp. rate 17, height 5' 4"  (1.626 m), weight 144 lb 11.2 oz (65.6 kg), SpO2 100 %.  LABORATORY DATA: Lab Results  Component Value Date   WBC 13.6 (H) 05/04/2017   HGB 12.7 05/04/2017   HCT 37.9 05/04/2017   MCV 94.3 05/04/2017   PLT 311 05/04/2017      Chemistry      Component Value Date/Time   NA 140 05/04/2017 1018   K 3.7 05/04/2017 1018   CL 101 05/01/2012 0927   CO2 21 (L) 05/04/2017 1018   BUN 23.9 05/04/2017 1018   CREATININE 1.4 (H) 05/04/2017 1018      Component Value Date/Time   CALCIUM 10.3 05/04/2017 1018   ALKPHOS 97 05/04/2017 1018   AST 19 05/04/2017 1018   ALT 17  05/04/2017 1018   BILITOT 0.32 05/04/2017 1018       RADIOGRAPHIC STUDIES: No results found.  ASSESSMENT AND PLAN:  This is a very pleasant 66 years old white female with stage IV non-small cell lung cancer with no actionable mutations and PDL 1 expression of 70%. She was initially treated with systemic chemotherapy with carboplatin, Alimta and Avastin status post 3 cycles discontinued secondary to disease progression. The patient is currently on treatment with immunotherapy with Ketruda (pembrolizumab) status post 3 cycles and has been tolerating this treatment well. She was supposed to have repeat CT scan before this visit but unfortunately it was not scheduled. I will try to get her scan scheduled as soon as possible. I recommended for the patient to proceed with cycle #4 today as a scheduled. For the sinus infection, I started the patient on Augmentin 875 mg by mouth twice a day. I will see the patient back for follow-up visit in 3 weeks for evaluation before starting the next cycle of her treatment. She was advised to call immediately if she has any concerning symptoms in the interval. The patient voices understanding of current disease status and treatment options and is in agreement with the current care plan. All questions were answered. The patient knows to call the clinic with any problems, questions or concerns. We can certainly see the patient much sooner if necessary.  Disclaimer: This note was dictated with voice recognition software. Similar sounding words can inadvertently be transcribed and may not be corrected upon review.

## 2017-05-05 ENCOUNTER — Telehealth: Payer: Self-pay | Admitting: Internal Medicine

## 2017-05-05 NOTE — Telephone Encounter (Signed)
Scheduled additional appts per 6/21 los - patient to get update schedule next visit.

## 2017-05-06 ENCOUNTER — Other Ambulatory Visit: Payer: Self-pay | Admitting: Radiology

## 2017-05-07 ENCOUNTER — Other Ambulatory Visit: Payer: Self-pay | Admitting: Radiology

## 2017-05-08 ENCOUNTER — Ambulatory Visit
Admission: RE | Admit: 2017-05-08 | Discharge: 2017-05-08 | Disposition: A | Discharge status: Home / Self Care | Payer: BLUE CROSS/BLUE SHIELD | Source: Ambulatory Visit | Attending: Radiation Oncology | Admitting: Radiation Oncology

## 2017-05-08 DIAGNOSIS — C7931 Secondary malignant neoplasm of brain: Secondary | ICD-10-CM

## 2017-05-08 DIAGNOSIS — C801 Malignant (primary) neoplasm, unspecified: Secondary | ICD-10-CM | POA: Diagnosis not present

## 2017-05-08 MED ORDER — GADOBENATE DIMEGLUMINE 529 MG/ML IV SOLN
7.0000 mL | Freq: Once | INTRAVENOUS | Status: AC | PRN
  Administered 2017-05-08: 7 mL via INTRAVENOUS

## 2017-05-09 ENCOUNTER — Ambulatory Visit (HOSPITAL_COMMUNITY)
Admission: RE | Admit: 2017-05-09 | Discharge: 2017-05-09 | Disposition: A | Discharge status: Home / Self Care | Payer: BLUE CROSS/BLUE SHIELD | Source: Ambulatory Visit | Attending: Internal Medicine | Admitting: Internal Medicine

## 2017-05-09 ENCOUNTER — Encounter (HOSPITAL_COMMUNITY): Payer: Self-pay

## 2017-05-09 ENCOUNTER — Other Ambulatory Visit: Payer: Self-pay | Admitting: Internal Medicine

## 2017-05-09 DIAGNOSIS — K219 Gastro-esophageal reflux disease without esophagitis: Secondary | ICD-10-CM | POA: Diagnosis not present

## 2017-05-09 DIAGNOSIS — I10 Essential (primary) hypertension: Secondary | ICD-10-CM | POA: Diagnosis not present

## 2017-05-09 DIAGNOSIS — I878 Other specified disorders of veins: Secondary | ICD-10-CM

## 2017-05-09 DIAGNOSIS — J45909 Unspecified asthma, uncomplicated: Secondary | ICD-10-CM | POA: Insufficient documentation

## 2017-05-09 DIAGNOSIS — Z7982 Long term (current) use of aspirin: Secondary | ICD-10-CM | POA: Insufficient documentation

## 2017-05-09 DIAGNOSIS — C3412 Malignant neoplasm of upper lobe, left bronchus or lung: Secondary | ICD-10-CM | POA: Diagnosis not present

## 2017-05-09 DIAGNOSIS — Z85118 Personal history of other malignant neoplasm of bronchus and lung: Secondary | ICD-10-CM | POA: Diagnosis not present

## 2017-05-09 DIAGNOSIS — C3492 Malignant neoplasm of unspecified part of left bronchus or lung: Secondary | ICD-10-CM | POA: Diagnosis not present

## 2017-05-09 DIAGNOSIS — Z452 Encounter for adjustment and management of vascular access device: Secondary | ICD-10-CM | POA: Diagnosis not present

## 2017-05-09 DIAGNOSIS — R5382 Chronic fatigue, unspecified: Secondary | ICD-10-CM | POA: Diagnosis not present

## 2017-05-09 DIAGNOSIS — M1990 Unspecified osteoarthritis, unspecified site: Secondary | ICD-10-CM | POA: Insufficient documentation

## 2017-05-09 DIAGNOSIS — Z5111 Encounter for antineoplastic chemotherapy: Secondary | ICD-10-CM | POA: Diagnosis not present

## 2017-05-09 DIAGNOSIS — C7931 Secondary malignant neoplasm of brain: Secondary | ICD-10-CM | POA: Diagnosis not present

## 2017-05-09 HISTORY — PX: IR FLUORO GUIDE PORT INSERTION RIGHT: IMG5741

## 2017-05-09 HISTORY — PX: IR US GUIDE VASC ACCESS RIGHT: IMG2390

## 2017-05-09 LAB — BASIC METABOLIC PANEL
ANION GAP: 11 (ref 5–15)
BUN: 17 mg/dL (ref 6–20)
CALCIUM: 9.4 mg/dL (ref 8.9–10.3)
CO2: 24 mmol/L (ref 22–32)
Chloride: 106 mmol/L (ref 101–111)
Creatinine, Ser: 0.78 mg/dL (ref 0.44–1.00)
Glucose, Bld: 88 mg/dL (ref 65–99)
POTASSIUM: 3.7 mmol/L (ref 3.5–5.1)
SODIUM: 141 mmol/L (ref 135–145)

## 2017-05-09 LAB — CBC
HEMATOCRIT: 32.5 % — AB (ref 36.0–46.0)
HEMOGLOBIN: 10.8 g/dL — AB (ref 12.0–15.0)
MCH: 31.4 pg (ref 26.0–34.0)
MCHC: 33.2 g/dL (ref 30.0–36.0)
MCV: 94.5 fL (ref 78.0–100.0)
Platelets: 258 10*3/uL (ref 150–400)
RBC: 3.44 MIL/uL — AB (ref 3.87–5.11)
RDW: 14.9 % (ref 11.5–15.5)
WBC: 13.4 10*3/uL — AB (ref 4.0–10.5)

## 2017-05-09 LAB — PROTIME-INR
INR: 1.05
PROTHROMBIN TIME: 13.7 s (ref 11.4–15.2)

## 2017-05-09 LAB — APTT: APTT: 32 s (ref 24–36)

## 2017-05-09 MED ORDER — CEFAZOLIN SODIUM-DEXTROSE 2-4 GM/100ML-% IV SOLN
2.0000 g | INTRAVENOUS | Status: AC
  Administered 2017-05-09: 2 g via INTRAVENOUS

## 2017-05-09 MED ORDER — MIDAZOLAM HCL 2 MG/2ML IJ SOLN
INTRAMUSCULAR | Status: AC
  Filled 2017-05-09: qty 4

## 2017-05-09 MED ORDER — HEPARIN SOD (PORK) LOCK FLUSH 100 UNIT/ML IV SOLN
INTRAVENOUS | Status: AC
  Filled 2017-05-09: qty 5

## 2017-05-09 MED ORDER — LIDOCAINE-EPINEPHRINE (PF) 2 %-1:200000 IJ SOLN
INTRAMUSCULAR | Status: AC
  Filled 2017-05-09: qty 20

## 2017-05-09 MED ORDER — CEFAZOLIN SODIUM-DEXTROSE 2-4 GM/100ML-% IV SOLN
INTRAVENOUS | Status: AC
  Administered 2017-05-09: 2 g via INTRAVENOUS
  Filled 2017-05-09: qty 100

## 2017-05-09 MED ORDER — LIDOCAINE-EPINEPHRINE (PF) 2 %-1:200000 IJ SOLN
INTRAMUSCULAR | Status: AC | PRN
  Administered 2017-05-09: 20 mL

## 2017-05-09 MED ORDER — FENTANYL CITRATE (PF) 100 MCG/2ML IJ SOLN
INTRAMUSCULAR | Status: AC
  Filled 2017-05-09: qty 4

## 2017-05-09 MED ORDER — FENTANYL CITRATE (PF) 100 MCG/2ML IJ SOLN
INTRAMUSCULAR | Status: AC | PRN
  Administered 2017-05-09 (×2): 50 ug via INTRAVENOUS

## 2017-05-09 MED ORDER — MIDAZOLAM HCL 2 MG/2ML IJ SOLN
INTRAMUSCULAR | Status: AC | PRN
  Administered 2017-05-09 (×2): 1 mg via INTRAVENOUS

## 2017-05-09 MED ORDER — SODIUM CHLORIDE 0.9 % IV SOLN
INTRAVENOUS | Status: DC
  Administered 2017-05-09: 11:00:00 via INTRAVENOUS

## 2017-05-09 NOTE — Procedures (Signed)
Pre Procedure Dx: Poor venous access Post Procedural Dx: Same  Successful placement of right IJ approach port-a-cath with tip at the superior caval atrial junction. The catheter is ready for immediate use.  Estimated Blood Loss: Minimal  Complications: None immediate.  Jay Nixon [place], MD Pager #: [phone]   

## 2017-05-09 NOTE — H&P (Signed)
Referring Physician(s): Mohamed,Mohamed  Supervising Physician: Sandi Mariscal  Patient Status:  [facility]  Chief Complaint:  "I'm getting a port"  Subjective: Patient familiar to IR service from prior right liver lesion biopsy in January of this year as well as an abdominal wall soft tissue mass/lymph node biopsy on 12/08/16. 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 She has poor venous access and presents today for Port-A-Cath placement. She was seen by IR service on 04/27/17 but due to elevated WBC and concern for sinus infection procedure was postponed until today. She has since been started on Augmentin. She currently denies fever, headache, chest pain, dyspnea, cough, abdominal/back pain, nausea, vomiting or abnormal bleeding. Past Medical History:  Diagnosis Date  . Adenocarcinoma of left lung, stage 4 ([facility]) 12/16/2016  . Arthritis   . Asthma due to seasonal allergies   . Chronic fatigue 02/23/2017  . Encounter for antineoplastic chemotherapy 12/17/2016  . GERD (gastroesophageal reflux disease)   . Goals of care, counseling/discussion 12/17/2016  . Hernia of abdominal wall    umbilical  . Hypertension 01/09/2017  . No pertinent past medical history    COLD UPPER RESP STARTED ZPAK 6/14  . Recurrent upper respiratory infection (URI)    recent sinus inf  . Sinus infection 02/25/2017  . Thyroid nodule   . Urine blood    hematuria   Past Surgical History:  Procedure Laterality Date  . ABDOMINAL HYSTERECTOMY  08   vag, rectocele repair , bladder sling  . ANTERIOR CERVICAL DECOMP/DISCECTOMY FUSION  05/09/2012   Procedure: ANTERIOR CERVICAL DECOMPRESSION/DISCECTOMY FUSION 1 LEVEL;  Surgeon: Eustace Moore, MD;  Location: [facility];  Service: Neurosurgery;  Laterality: N/A;  anterior cervical decompression fusion cervical five-six  . BREAST CYST EXCISION  96   fibroadenoma lft  . BREAST  SURGERY     03  . CARPAL TUNNEL RELEASE  05   bil  . DILATION AND CURETTAGE OF UTERUS  03    polyps  . TUBAL LIGATION  88      Allergies: Prednisone; Fenofibrate; Quinolones; Statins; and Septra [sulfamethoxazole-trimethoprim]  Medications: Prior to Admission medications   Medication Sig Start Date End Date Taking? Authorizing Provider  amoxicillin-clavulanate (AUGMENTIN) 875-125 MG tablet Take 1 tablet by mouth 2 (two) times daily. 05/04/17   Curt Bears, MD  aspirin EC 81 MG tablet Take 81 mg by mouth daily.    [provider]  calcium gluconate 500 MG tablet Take 1 tablet by mouth 2 (two) times daily.    [provider]  Cholecalciferol (VITAMIN D) 2000 units tablet Take 2,000 Units by mouth daily.    [provider]  conjugated estrogens (PREMARIN) vaginal cream Place 1 Applicatorful vaginally daily.    [provider]  Cranberry 500 MG TABS Take 1 tablet by mouth 2 (two) times daily.    [provider]  ezetimibe (ZETIA) 10 MG tablet Take 10 mg by mouth daily.    [provider]  ketotifen (ZADITOR) 0.025 % ophthalmic solution Place 1 drop into both eyes 2 (two) times daily.    [provider]  loratadine (CLARITIN) 10 MG tablet Take 10 mg by mouth daily.    [provider]  LORazepam (ATIVAN) 0.5 MG tablet Take 1 tablet (0.5 mg total) by mouth every 8 (eight) hours. 02/16/17   Curt Bears, MD  LORazepam (ATIVAN) 0.5 MG tablet 1 tablet  po 30 minutes prior to radiation or MRI 02/23/17   Curt Bears, MD  Multiple Vitamin (MULTIVITAMIN) tablet Take 1 tablet by mouth daily.    [provider]  ondansetron (ZOFRAN) 8 MG tablet Take 1 tablet (8 mg total) by mouth every 8 (eight) hours as needed for nausea or vomiting. 01/16/17   Curt Bears, MD  oxyCODONE (OXY IR/ROXICODONE) 5 MG immediate release tablet Take 1-3 tablets (5-15 mg total) by mouth every 4 (four) hours as needed for severe pain.  02/02/17   Curt Bears, MD  pantoprazole (PROTONIX) 40 MG tablet Take 40 mg by mouth 2 (two) times daily.     [provider]  prochlorperazine (COMPAZINE) 10 MG tablet TAKE ONE TABLET BY MOUTH EVERY 6 HOURS AS NEEDED FOR NAUSEA/VOMITING 01/16/17   Curt Bears, MD  raloxifene (EVISTA) 60 MG tablet Take 60 mg by mouth daily.    [provider]  sodium chloride (OCEAN) 0.65 % nasal spray Place 1 spray into the nose daily as needed for congestion.     [provider]  temazepam (RESTORIL) 30 MG capsule Take 1 capsule (30 mg total) by mouth at bedtime as needed for sleep. 04/21/17   Curt Bears, MD  trimethoprim (TRIMPEX) 100 MG tablet Take 100 mg by mouth daily.     [provider]  VASCEPA 1 g CAPS Take 1 tablet by mouth 2 (two) times daily. 03/09/17   [provider]  vitamin C (ASCORBIC ACID) 250 MG tablet Take 250 mg by mouth daily.    [provider]     Vital Signs: Vitals:   05/09/17 1010  BP: 119/72  Pulse: 67  Resp: 16  Temp: 98.6 F (37 C)       Physical Exam awake, alert. Chest clear to auscultation bilaterally. Heart with regular rate and rhythm. Abdomen soft, positive bowel sounds, nontender. No lower extremity edema.  Imaging: Mr Jeri Cos BM Contrast  Result Date: 05/08/2017 CLINICAL DATA:  Metastatic lung cancer. Stereotactic radiosurgery follow-up. EXAM: MRI HEAD WITHOUT AND WITH CONTRAST TECHNIQUE: Multiplanar, multiecho pulse sequences of the brain and surrounding structures were obtained without and with intravenous contrast. CONTRAST:  61mL MULTIHANCE GADOBENATE DIMEGLUMINE 529 MG/ML IV SOLN COMPARISON:  MRI 02/06/2017, 01/01/2017 FINDINGS: Brain: Stable treated metastatic lesions in the brain. Right choroid lesion stable. Right choroid slightly larger than the left. Left posterior frontal lesion, stable with minimal enhancement Right parietal lesion no longer visualized Right cerebellar lesion stable without  abnormal enhancement. Left frontal convexity dural based enhancing lesion 10 mm unchanged most compatible with meningioma. Ventricle size normal. No acute infarct. Mild chronic microvascular ischemic change in the white matter. Vascular: Normal arterial flow void Skull and upper cervical spine: Stable right frontal bone hemangioma. No new skull lesions. Sinuses/Orbits: Mild mucosal edema paranasal sinuses Other: Not IMPRESSION: Stable metastatic disease in the brain.  No new or enlarging tumor. Stable 1 cm left frontal convexity meningioma. Electronically Signed   By: Franchot Gallo M.D.   On: 05/08/2017 15:06    Labs:  CBC:  Recent Labs  03/23/17 1051 04/13/17 1118 04/27/17 1002 05/04/17 1018  WBC 8.5 10.8* 12.3* 13.6*  HGB 11.2* 12.3 11.9* 12.7  HCT 32.7* 36.0 36.0 37.9  PLT 278 273 281 311    COAGS:  Recent Labs  11/21/16 1122 12/08/16 1156 04/27/17 1002  INR 1.12 1.07 1.06  APTT 31 32  --     BMP:  Recent Labs  03/02/17 1104 03/23/17 1051 04/13/17 1118  05/04/17 1018  NA 141 141 138 140  K 3.8 4.3 3.9 3.7  CO2 19* 24 21* 21*  GLUCOSE 174* 85 111 97  BUN 15.4 15.6 20.7 23.9  CALCIUM 9.4 9.5 10.0 10.3  CREATININE 1.1 1.0 0.9 1.4*    LIVER FUNCTION TESTS:  Recent Labs  03/02/17 1104 03/23/17 1051 04/13/17 1118 05/04/17 1018  BILITOT 0.30 0.31 0.33 0.32  AST 16 18 20 19   ALT 11 13 13 17   ALKPHOS 98 87 91 97  PROT 7.0 7.3 7.7 7.9  ALBUMIN 3.3* 3.5 4.0 3.7    Assessment and Plan:  Pt with history of stage IV B adenocarcinoma of the left lung, status post chemoradiation and currently on immunotherapy. She has poor venous access and presents today for Port-A-Cath placement. She was seen by IR service on 04/27/17 but due to elevated WBC and concern for sinus infection procedure was postponed until today. She has since been started on Augmentin.Risks and benefits discussed with the patient/daughter including, but not limited to bleeding, infection,  pneumothorax, or fibrin sheath development and need for additional procedures. All of the patient's questions were answered, patient is agreeable to proceed.Consent signed and in chart.Labs pending.     Electronically Signed: D. Rowe Robert, PA-C 05/09/2017, 9:53 AM   I spent a total of 20 minutes at the the patient's bedside AND on the patient's hospital floor or unit, greater than 50% of which was counseling/coordinating care for Port-A-Cath placement

## 2017-05-09 NOTE — Discharge Instructions (Signed)
Moderate Conscious Sedation, Adult, Care After °These instructions provide you with information about caring for yourself after your procedure. Your health care provider may also give you more specific instructions. Your treatment has been planned according to current medical practices, but problems sometimes occur. Call your health care provider if you have any problems or questions after your procedure. °What can I expect after the procedure? °After your procedure, it is common: °· To feel sleepy for several hours. °· To feel clumsy and have poor balance for several hours. °· To have poor judgment for several hours. °· To vomit if you eat too soon. ° °Follow these instructions at home: °For at least 24 hours after the procedure: ° °· Do not: °? Participate in activities where you could fall or become injured. °? Drive. °? Use heavy machinery. °? Drink alcohol. °? Take sleeping pills or medicines that cause drowsiness. °? Make important decisions or sign legal documents. °? Take care of children on your own. °· Rest. °Eating and drinking °· Follow the diet recommended by your health care provider. °· If you vomit: °? Drink water, juice, or soup when you can drink without vomiting. °? Make sure you have little or no nausea before eating solid foods. °General instructions °· Have a responsible adult stay with you until you are awake and alert. °· Take over-the-counter and prescription medicines only as told by your health care provider. °· If you smoke, do not smoke without supervision. °· Keep all follow-up visits as told by your health care provider. This is important. °Contact a health care provider if: °· You keep feeling nauseous or you keep vomiting. °· You feel light-headed. °· You develop a rash. °· You have a fever. °Get help right away if: °· You have trouble breathing. °This information is not intended to replace advice given to you by your health care provider. Make sure you discuss any questions you have  with your health care provider. °Document Released: 08/21/2013 Document Revised: 04/04/2016 Document Reviewed: 02/20/2016 °Elsevier Interactive Patient Education © 2018 [employer] ° ° °Implanted Port Home Guide °An implanted port is a type of central line that is placed under the skin. Central lines are used to provide IV access when treatment or nutrition needs to be given through a person’s veins. Implanted ports are used for long-term IV access. An implanted port may be placed because: °· You need IV medicine that would be irritating to the small veins in your hands or arms. °· You need long-term IV medicines, such as antibiotics. °· You need IV nutrition for a long period. °· You need frequent blood draws for lab tests. °· You need dialysis. ° °Implanted ports are usually placed in the chest area, but they can also be placed in the upper arm, the abdomen, or the leg. An implanted port has two main parts: °· Reservoir. The reservoir is round and will appear as a small, raised area under your skin. The reservoir is the part where a needle is inserted to give medicines or draw blood. °· Catheter. The catheter is a thin, flexible tube that extends from the reservoir. The catheter is placed into a large vein. Medicine that is inserted into the reservoir goes into the catheter and then into the vein. ° °How will I care for my incision site? °Do not get the incision site wet. Bathe or shower as directed by your health care provider. °How is my port accessed? °Special steps must be taken to access the   port: °· Before the port is accessed, a numbing cream can be placed on the skin. This helps numb the skin over the port site. °· Your health care provider uses a sterile technique to access the port. °? Your health care provider must put on a mask and sterile gloves. °? The skin over your port is cleaned carefully with an antiseptic and allowed to dry. °? The port is gently pinched between sterile gloves, and a needle  is inserted into the port. °· Only "non-coring" port needles should be used to access the port. Once the port is accessed, a blood return should be checked. This helps ensure that the port is in the vein and is not clogged. °· If your port needs to remain accessed for a constant infusion, a clear (transparent) bandage will be placed over the needle site. The bandage and needle will need to be changed every week, or as directed by your health care provider. °· Keep the bandage covering the needle clean and dry. Do not get it wet. Follow your health care provider’s instructions on how to take a shower or bath while the port is accessed. °· If your port does not need to stay accessed, no bandage is needed over the port. ° °What is flushing? °Flushing helps keep the port from getting clogged. Follow your health care provider’s instructions on how and when to flush the port. Ports are usually flushed with saline solution or a medicine called heparin. The need for flushing will depend on how the port is used. °· If the port is used for intermittent medicines or blood draws, the port will need to be flushed: °? After medicines have been given. °? After blood has been drawn. °? As part of routine maintenance. °· If a constant infusion is running, the port may not need to be flushed. ° °How long will my port stay implanted? °The port can stay in for as long as your health care provider thinks it is needed. When it is time for the port to come out, surgery will be done to remove it. The procedure is similar to the one performed when the port was put in. °When should I seek immediate medical care? °When you have an implanted port, you should seek immediate medical care if: °· You notice a bad smell coming from the incision site. °· You have swelling, redness, or drainage at the incision site. °· You have more swelling or pain at the port site or the surrounding area. °· You have a fever that is not controlled with  medicine. ° °This information is not intended to replace advice given to you by your health care provider. Make sure you discuss any questions you have with your health care provider. °Document Released: 10/31/2005 Document Revised: 04/07/2016 Document Reviewed: 07/08/2013 °Elsevier Interactive Patient Education © 2017 [employer] ° ° °Implanted Port Insertion, Care After °This sheet gives you information about how to care for yourself after your procedure. Your health care provider may also give you more specific instructions. If you have problems or questions, contact your health care provider. °What can I expect after the procedure? °After your procedure, it is common to have: °· Discomfort at the port insertion site. °· Bruising on the skin over the port. This should improve over 3-4 days. ° °Follow these instructions at home: °Port care °· After your port is placed, you will get a manufacturer's information card. The card has information about your port. Keep this card with   you at all times. °· Take care of the port as told by your health care provider. Ask your health care provider if you or a family member can get training for taking care of the port at home. A home health care nurse may also take care of the port. °· Make sure to remember what type of port you have. °Incision care °· Follow instructions from your health care provider about how to take care of your port insertion site. Make sure you: °? Wash your hands with soap and water before you change your bandage (dressing). If soap and water are not available, use hand sanitizer. °? Change your dressing as told by your health care provider. °? Leave stitches (sutures), skin glue, or adhesive strips in place. These skin closures may need to stay in place for 2 weeks or longer. If adhesive strip edges start to loosen and curl up, you may trim the loose edges. Do not remove adhesive strips completely unless your health care provider tells you to do  that. °· Check your port insertion site every day for signs of infection. Check for: °? More redness, swelling, or pain. °? More fluid or blood. °? Warmth. °? Pus or a bad smell. °General instructions °· Do not take baths, swim, or use a hot tub until your health care provider approves. °· Do not lift anything that is heavier than 10 lb (4.5 kg) for a week, or as told by your health care provider. °· Ask your health care provider when it is okay to: °? Return to work or school. °? Resume usual physical activities or sports. °· Do not drive for 24 hours if you were given a medicine to help you relax (sedative). °· Take over-the-counter and prescription medicines only as told by your health care provider. °· Wear a medical alert bracelet in case of an emergency. This will tell any health care providers that you have a port. °· Keep all follow-up visits as told by your health care provider. This is important. °Contact a health care provider if: °· You cannot flush your port with saline as directed, or you cannot draw blood from the port. °· You have a fever or chills. °· You have more redness, swelling, or pain around your port insertion site. °· You have more fluid or blood coming from your port insertion site. °· Your port insertion site feels warm to the touch. °· You have pus or a bad smell coming from the port insertion site. °Get help right away if: °· You have chest pain or shortness of breath. °· You have bleeding from your port that you cannot control. °Summary °· Take care of the port as told by your health care provider. °· Change your dressing as told by your health care provider. °· Keep all follow-up visits as told by your health care provider. °This information is not intended to replace advice given to you by your health care provider. Make sure you discuss any questions you have with your health care provider. °Document Released: 08/21/2013 Document Revised: 09/21/2016 Document Reviewed:  09/21/2016 °Elsevier Interactive Patient Education © 2017 [employer] ° ° °

## 2017-05-11 ENCOUNTER — Encounter: Payer: Self-pay | Admitting: Urology

## 2017-05-11 ENCOUNTER — Ambulatory Visit
Admission: RE | Admit: 2017-05-11 | Discharge: 2017-05-11 | Disposition: A | Discharge status: Home / Self Care | Payer: BLUE CROSS/BLUE SHIELD | Source: Ambulatory Visit | Attending: Radiation Oncology | Admitting: Radiation Oncology

## 2017-05-11 VITALS — BP 108/47 | HR 97 | Temp 98.6°F | Resp 16 | Ht 64.5 in | Wt 145.2 lb

## 2017-05-11 DIAGNOSIS — C7802 Secondary malignant neoplasm of left lung: Secondary | ICD-10-CM | POA: Insufficient documentation

## 2017-05-11 DIAGNOSIS — Z803 Family history of malignant neoplasm of breast: Secondary | ICD-10-CM | POA: Insufficient documentation

## 2017-05-11 DIAGNOSIS — E041 Nontoxic single thyroid nodule: Secondary | ICD-10-CM | POA: Diagnosis not present

## 2017-05-11 DIAGNOSIS — Z510 Encounter for antineoplastic radiation therapy: Secondary | ICD-10-CM | POA: Insufficient documentation

## 2017-05-11 DIAGNOSIS — Z9889 Other specified postprocedural states: Secondary | ICD-10-CM | POA: Insufficient documentation

## 2017-05-11 DIAGNOSIS — Z79899 Other long term (current) drug therapy: Secondary | ICD-10-CM | POA: Diagnosis not present

## 2017-05-11 DIAGNOSIS — C7970 Secondary malignant neoplasm of unspecified adrenal gland: Secondary | ICD-10-CM | POA: Diagnosis not present

## 2017-05-11 DIAGNOSIS — K219 Gastro-esophageal reflux disease without esophagitis: Secondary | ICD-10-CM | POA: Diagnosis not present

## 2017-05-11 DIAGNOSIS — J45909 Unspecified asthma, uncomplicated: Secondary | ICD-10-CM | POA: Insufficient documentation

## 2017-05-11 DIAGNOSIS — C787 Secondary malignant neoplasm of liver and intrahepatic bile duct: Secondary | ICD-10-CM | POA: Insufficient documentation

## 2017-05-11 DIAGNOSIS — C3492 Malignant neoplasm of unspecified part of left bronchus or lung: Secondary | ICD-10-CM

## 2017-05-11 DIAGNOSIS — C3412 Malignant neoplasm of upper lobe, left bronchus or lung: Secondary | ICD-10-CM | POA: Diagnosis not present

## 2017-05-11 DIAGNOSIS — C7951 Secondary malignant neoplasm of bone: Secondary | ICD-10-CM | POA: Diagnosis not present

## 2017-05-11 DIAGNOSIS — I10 Essential (primary) hypertension: Secondary | ICD-10-CM | POA: Diagnosis not present

## 2017-05-11 DIAGNOSIS — Z923 Personal history of irradiation: Secondary | ICD-10-CM | POA: Diagnosis not present

## 2017-05-11 DIAGNOSIS — Z85841 Personal history of malignant neoplasm of brain: Secondary | ICD-10-CM | POA: Diagnosis not present

## 2017-05-11 DIAGNOSIS — Z888 Allergy status to other drugs, medicaments and biological substances status: Secondary | ICD-10-CM | POA: Insufficient documentation

## 2017-05-11 DIAGNOSIS — J329 Chronic sinusitis, unspecified: Secondary | ICD-10-CM | POA: Insufficient documentation

## 2017-05-11 DIAGNOSIS — Z90710 Acquired absence of both cervix and uterus: Secondary | ICD-10-CM | POA: Insufficient documentation

## 2017-05-11 DIAGNOSIS — C7931 Secondary malignant neoplasm of brain: Secondary | ICD-10-CM | POA: Insufficient documentation

## 2017-05-11 DIAGNOSIS — Z8042 Family history of malignant neoplasm of prostate: Secondary | ICD-10-CM | POA: Insufficient documentation

## 2017-05-11 NOTE — Progress Notes (Signed)
Radiation Oncology         [phone] ________________________________  Follow-up Visit Note  Name: Karen Vasquez MRN: [id number]  Date: 05/11/2017  DOB: 1950-11-22  CC:Kim, Jeneen Rinks, MD  Curt Bears, MD   REFERRING PHYSICIAN: Curt Bears, MD  DIAGNOSIS: The primary encounter diagnosis was Adenocarcinoma of lung, stage 4, left ([place]). Diagnoses of Liver metastases ([place]), Brain metastases ([place]), Bone metastasis ([place]), Brain metastasis ([place]), and Adenocarcinoma of left lung, stage 4 ([place]) were also pertinent to this visit.  66 y.o. woman with adenocarcinoma of the left lung, stage IV ([place]), with liver and brain metastases  INTERVAL SINCE LAST RADIATION: 15 weeks 01/04/17-01/23/17: 35 Gy to the chest, 20 Gy to PTV 1-4, 20 Gy to sacrum  01/09/17: Karen Vasquez received stereotactic radiosurgery to the following targets: Four targets (PTV1 Rt Choroid 24 mm, PTV2 Lt Frontal 6 mm, PTV3 Rt Parietal 2 mm, PTV4 Lt Convexity 7 mm) were treated using 4 Dynamic Conformal Arcs to a prescription dose of 18-20 Gy.  ExacTrac registration was performed for each couch angle.  The 100% isodose line was prescribed.  6 MV X-rays were delivered in the flattening filter free beam mode.  NARRATIVE: Karen Vasquez is a 66 y.o. female seen for a routine follow-up. In summary, the patient was found to have a poorly defined 3.7 cm lesion in the mediastinal prevascular space and a 1.8 spiculated lesion in the anterior medial left upper lobe on a CT scan of the chest and neck on 11/01/16.  PET scan on 11/17/16 showed widespread malignancy with metastatic disease to the liver, bilateral adrenal glands, abdominal lymph nodes, and bony metastasis. She was seen by Dr. Julien Nordmann on 12/16/16, and was started on Carboplatin, Alimta, and Avastin on 12/22/16. She completed 3 cycles, with her last on 02/02/17.  The patient was seen in radiation oncology on 12/26/16 and completed palliative radiation to the chest, thoracic spine,  and sacrum on 01/23/17.    The patient underwent Brain MRI on 12/20/2016. This scan showed a small intraparenchymal metastatic lesion within the left frontal lobe with very mild surrounding vasogenic edema, with no mass effect or hemorrhage. Also noted was asymmetrically enlarged and contrast-enhancing right choroid plexus. In the context of known widespread metastatic disease, this is concerning for a metastasis. There was also a small left frontal convexity dural-based mass, most consistent with meningioma. However, in this scenario, a dural-based metastasis would be difficult to exclude.   Repeat MRI on 01/01/17 for Karen Vasquez planning purposes demonstrated a new 12 mm curvilinear enhancement in the posterior right cerebellum; indeterminate but felt to most resemble a subacute lacunar infarct.  She has completed SRS to the brain lesions on 01/09/17.  Surveillance CT C/A/P on 02/21/17 demonstrated disease progression with increased size and number of liver metastases despite continued systemic chemotherapy. The LUL nodule measured 0.8 x 0.6 cm, decreased from 1.7 x 1.7 cm previously and mediastinal adenopathy showed improvement as well. New moderate pathologic T12 vertebral compression fracture. Small newly sclerotic lesions are noted throughout the thoracic spine, sternum and bilateral ribs but may represent treatment effect. Adrenal mets are stable to decreased in size.   Carboplatin, Alimta, and Avastin were discontinued due to disease progression and she was started on immunotherapy with Keytruda (pembrolizumab) 200 MG IV every 3 weeks beginning on 03/02/17.  Recent MRI brain demonstrates stability of her metastatic brain disease with no new or enlarging tumors.  There is a stable 1 cm frontal convexity meningioma.  She continues  on Keytruda immunotherapy under the care and direction of Dr. Earlie Server.  She reports that she is tolerating this very well.  ROS: She reports that overall she feels well aside from  persistent fatigue. She continues with intermittent blurred vision for short periods of time, mild shortness of breath when talking, and a dry cough. 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 She has noticed a significant improvement in her nausea and appetite since starting the [facility]quez immunotherapy. She reports that she has even managed to gain a few pounds.  She has recently had mild nausea and diarrhea which she attributes to being on antibiotics for a recent sinus and urinary tract infection. She manages the nausea with Ativan as needed.  She denies headaches, productive cough, pain with swallowing, or skin irritation. Patient reports she has a little bit of memory issues that does not affect her daily life. Today, she was able to answer all questions appropriately and without difficulty or problems with word finding.  She is scheduled for follow up CT scans of the chest, abdomen and pelvis tomorrow, 05/12/17.   PAST MEDICAL HISTORY:  Past Medical History:  Diagnosis Date  . Adenocarcinoma of left lung, stage 4 ([place]) 12/16/2016  . Arthritis   . Asthma due to seasonal allergies   . Chronic fatigue 02/23/2017  . Encounter for antineoplastic chemotherapy 12/17/2016  . GERD (gastroesophageal reflux disease)   . Goals of care, counseling/discussion 12/17/2016  . Hernia of abdominal wall    umbilical  . Hypertension 01/09/2017  . No pertinent past medical history    COLD UPPER RESP STARTED ZPAK 6/14  . Recurrent upper respiratory infection (URI)    recent sinus inf  . Sinus infection 02/25/2017  . Thyroid nodule   . Urine blood    hematuria      PAST SURGICAL HISTORY: Past Surgical History:  Procedure Laterality Date  . ABDOMINAL HYSTERECTOMY  08   vag, rectocele repair , bladder sling  . ANTERIOR CERVICAL DECOMP/DISCECTOMY FUSION  05/09/2012   Procedure: ANTERIOR CERVICAL DECOMPRESSION/DISCECTOMY FUSION 1 LEVEL;  Surgeon: Eustace Moore, MD;  Location: [facility];  Service:  Neurosurgery;  Laterality: N/A;  anterior cervical decompression fusion cervical five-six  . BREAST CYST EXCISION  96   fibroadenoma lft  . BREAST SURGERY     03  . CARPAL TUNNEL RELEASE  05   bil  . DILATION AND CURETTAGE OF UTERUS  03    polyps  . EYE SURGERY     cataracts bilat; left eye surgery   . IR FLUORO GUIDE PORT INSERTION RIGHT  05/09/2017  . IR US GUIDE VASC ACCESS RIGHT  05/09/2017  . TUBAL LIGATION  87    FAMILY HISTORY:  Family History  Problem Relation Age of Onset  . Cancer Mother        breast  . Cancer Father        prostate  . Muscular dystrophy Son     SOCIAL HISTORY:  Social History   Social History  . Marital status: Married    Spouse name: N/A  . Number of children: N/A  . Years of education: N/A   Occupational History  . Not on file.   Social History Main Topics  . Smoking status: Never Smoker  . Smokeless tobacco: Never Used  . Alcohol use No  . Drug use: No  . Sexual activity: Not Currently   Other Topics Concern  . Not on file   Social History  Narrative  . No narrative on file    ALLERGIES: Prednisone; Fenofibrate; Quinolones; Statins; and Septra [sulfamethoxazole-trimethoprim]  MEDICATIONS:  Current Outpatient Prescriptions  Medication Sig Dispense Refill  . amoxicillin-clavulanate (AUGMENTIN) 875-125 MG tablet Take 1 tablet by mouth 2 (two) times daily. 14 tablet 0  . aspirin EC 81 MG tablet Take 81 mg by mouth daily.    . calcium gluconate 500 MG tablet Take 1 tablet by mouth 2 (two) times daily.    . Cholecalciferol (VITAMIN D) 2000 units tablet Take 2,000 Units by mouth daily.    Marland Kitchen conjugated estrogens (PREMARIN) vaginal cream Place 1 Applicatorful vaginally daily.    . Cranberry 500 MG TABS Take 1 tablet by mouth daily.     Marland Kitchen ezetimibe (ZETIA) 10 MG tablet Take 10 mg by mouth daily.    Marland Kitchen ketotifen (ZADITOR) 0.025 % ophthalmic solution Place 1 drop into both eyes 2 (two) times daily.    Marland Kitchen loratadine (CLARITIN) 10 MG  tablet Take 10 mg by mouth daily.    Marland Kitchen LORazepam (ATIVAN) 0.5 MG tablet Take 1 tablet (0.5 mg total) by mouth every 8 (eight) hours. 30 tablet 0  . LORazepam (ATIVAN) 0.5 MG tablet 1 tablet po 30 minutes prior to radiation or MRI 30 tablet 0  . Multiple Vitamin (MULTIVITAMIN) tablet Take 1 tablet by mouth daily.    Marland Kitchen oxyCODONE (OXY IR/ROXICODONE) 5 MG immediate release tablet Take 1-3 tablets (5-15 mg total) by mouth every 4 (four) hours as needed for severe pain. 100 tablet 0  . pantoprazole (PROTONIX) 40 MG tablet Take 40 mg by mouth 2 (two) times daily.     . raloxifene (EVISTA) 60 MG tablet Take 60 mg by mouth daily.    . sodium chloride (OCEAN) 0.65 % nasal spray Place 1 spray into the nose daily as needed for congestion.     . temazepam (RESTORIL) 30 MG capsule Take 1 capsule (30 mg total) by mouth at bedtime as needed for sleep. 30 capsule 0  . trimethoprim (TRIMPEX) 100 MG tablet Take 100 mg by mouth daily.     Marland Kitchen VASCEPA 1 g CAPS Take 1 tablet by mouth 2 (two) times daily.    . vitamin C (ASCORBIC ACID) 250 MG tablet Take 250 mg by mouth daily.    . ondansetron (ZOFRAN) 8 MG tablet Take 1 tablet (8 mg total) by mouth every 8 (eight) hours as needed for nausea or vomiting. (Patient not taking: Reported on 05/11/2017) 30 tablet 0  . prochlorperazine (COMPAZINE) 10 MG tablet TAKE ONE TABLET BY MOUTH EVERY 6 HOURS AS NEEDED FOR NAUSEA/VOMITING (Patient not taking: Reported on 05/11/2017) 30 tablet 0   No current facility-administered medications for this encounter.    PHYSICAL EXAM:  Wt Readings from Last 3 Encounters:  05/11/17 145 lb 3.2 oz (65.9 kg)  05/09/17 143 lb 2 oz (64.9 kg)  05/04/17 144 lb 11.2 oz (65.6 kg)   Temp Readings from Last 3 Encounters:  05/11/17 98.6 F (37 C) (Oral)  05/09/17 98.4 F (36.9 C) (Oral)  05/04/17 99.5 F (37.5 C) (Oral)   BP Readings from Last 3 Encounters:  05/11/17 (!) 108/47  05/09/17 118/76  05/04/17 133/77   Pulse Readings from Last 3  Encounters:  05/11/17 97  05/09/17 80  05/04/17 92   Pain Assessment Pain Score: 0-No pain/10  In general this is a well appearing caucasian female in no acute distress. She is alert and oriented x4 and appropriate throughout the examination. Cardiopulmonary  assessment is negative for acute distress and she exhibits normal effort.   LABORATORY DATA:  Lab Results  Component Value Date   WBC 13.4 (H) 05/09/2017   HGB 10.8 (L) 05/09/2017   HCT 32.5 (L) 05/09/2017   MCV 94.5 05/09/2017   PLT 258 05/09/2017   Lab Results  Component Value Date   NA 141 05/09/2017   K 3.7 05/09/2017   CL 106 05/09/2017   CO2 24 05/09/2017   Lab Results  Component Value Date   ALT 17 05/04/2017   AST 19 05/04/2017   ALKPHOS 97 05/04/2017   BILITOT 0.32 05/04/2017     RADIOGRAPHY: Mr Jeri Cos WO Contrast  Result Date: 05/08/2017 CLINICAL DATA:  Metastatic lung cancer. Stereotactic radiosurgery follow-up. EXAM: MRI HEAD WITHOUT AND WITH CONTRAST TECHNIQUE: Multiplanar, multiecho pulse sequences of the brain and surrounding structures were obtained without and with intravenous contrast. CONTRAST:  1m MULTIHANCE GADOBENATE DIMEGLUMINE 529 MG/ML IV SOLN COMPARISON:  MRI 02/06/2017, 01/01/2017 FINDINGS: Brain: Stable treated metastatic lesions in the brain. Right choroid lesion stable. Right choroid slightly larger than the left. Left posterior frontal lesion, stable with minimal enhancement Right parietal lesion no longer visualized Right cerebellar lesion stable without abnormal enhancement. Left frontal convexity dural based enhancing lesion 10 mm unchanged most compatible with meningioma. Ventricle size normal. No acute infarct. Mild chronic microvascular ischemic change in the white matter. Vascular: Normal arterial flow void Skull and upper cervical spine: Stable right frontal bone hemangioma. No new skull lesions. Sinuses/Orbits: Mild mucosal edema paranasal sinuses Other: Not IMPRESSION: Stable  metastatic disease in the brain.  No new or enlarging tumor. Stable 1 cm left frontal convexity meningioma. Electronically Signed   By: CFranchot GalloM.D.   On: 05/08/2017 15:06   Ir UK[place]Guide Vasc Access Right  Result Date: 05/09/2017 INDICATION: History of adenocarcinoma of the lung. In need of durable intravenous access for chemotherapy administration. EXAM: IMPLANTED PORT A CATH PLACEMENT WITH ULTRASOUND AND FLUOROSCOPIC GUIDANCE COMPARISON:  Chest CT - 02/21/2017 MEDICATIONS: Ancef 2 gm IV; The antibiotic was administered within an appropriate time interval prior to skin puncture. ANESTHESIA/SEDATION: Moderate (conscious) sedation was employed during this procedure. A total of Versed 2 mg and Fentanyl 100 mcg was administered intravenously. Moderate Sedation Time: 25 minutes. The patient's level of consciousness and vital signs were monitored continuously by radiology nursing throughout the procedure under my direct supervision. CONTRAST:  None FLUOROSCOPY TIME:  24 seconds (5 mGy) COMPLICATIONS: None immediate. PROCEDURE: The procedure, risks, benefits, and alternatives were explained to the patient. Questions regarding the procedure were encouraged and answered. The patient understands and consents to the procedure. The right neck and chest were prepped with chlorhexidine in a sterile fashion, and a sterile drape was applied covering the operative field. Maximum barrier sterile technique with sterile gowns and gloves were used for the procedure. A timeout was performed prior to the initiation of the procedure. Local anesthesia was provided with 1% lidocaine with epinephrine. After creating a small venotomy incision, a micropuncture kit was utilized to access the internal jugular vein. Real-time ultrasound guidance was utilized for vascular access including the acquisition of a permanent ultrasound image documenting patency of the accessed vessel. The microwire was utilized to measure appropriate catheter  length. A subcutaneous port pocket was then created along the upper chest wall utilizing a combination of sharp and blunt dissection. The pocket was irrigated with sterile saline. A single lumen ISP power injectable port was chosen for placement. The 8 Fr  catheter was tunneled from the port pocket site to the venotomy incision. The port was placed in the pocket. The external catheter was trimmed to appropriate length. At the venotomy, an 8 Fr peel-away sheath was placed over a guidewire under fluoroscopic guidance. The catheter was then placed through the sheath and the sheath was removed. Final catheter positioning was confirmed and documented with a fluoroscopic spot radiograph. The port was accessed with a Huber needle, aspirated and flushed with heparinized saline. The venotomy site was closed with an interrupted 4-0 Vicryl suture. The port pocket incision was closed with interrupted 2-0 Vicryl suture and the skin was opposed with a running subcuticular 4-0 Vicryl suture. Dermabond and Steri-strips were applied to both incisions. Dressings were placed. The patient tolerated the procedure well without immediate post procedural complication. FINDINGS: After catheter placement, the tip lies within the superior cavoatrial junction. The catheter aspirates and flushes normally and is ready for immediate use. IMPRESSION: Successful placement of a right internal jugular approach power injectable Port-A-Cath. The catheter is ready for immediate use. Electronically Signed   By: Sandi Mariscal M.D.   On: 05/09/2017 13:29   Ir Fluoro Guide Port Insertion Right  Result Date: 05/09/2017 INDICATION: History of adenocarcinoma of the lung. In need of durable intravenous access for chemotherapy administration. EXAM: IMPLANTED PORT A CATH PLACEMENT WITH ULTRASOUND AND FLUOROSCOPIC GUIDANCE COMPARISON:  Chest CT - 02/21/2017 MEDICATIONS: Ancef 2 gm IV; The antibiotic was administered within an appropriate time interval prior to  skin puncture. ANESTHESIA/SEDATION: Moderate (conscious) sedation was employed during this procedure. A total of Versed 2 mg and Fentanyl 100 mcg was administered intravenously. Moderate Sedation Time: 25 minutes. The patient's level of consciousness and vital signs were monitored continuously by radiology nursing throughout the procedure under my direct supervision. CONTRAST:  None FLUOROSCOPY TIME:  24 seconds (5 mGy) COMPLICATIONS: None immediate. PROCEDURE: The procedure, risks, benefits, and alternatives were explained to the patient. Questions regarding the procedure were encouraged and answered. The patient understands and consents to the procedure. The right neck and chest were prepped with chlorhexidine in a sterile fashion, and a sterile drape was applied covering the operative field. Maximum barrier sterile technique with sterile gowns and gloves were used for the procedure. A timeout was performed prior to the initiation of the procedure. Local anesthesia was provided with 1% lidocaine with epinephrine. After creating a small venotomy incision, a micropuncture kit was utilized to access the internal jugular vein. Real-time ultrasound guidance was utilized for vascular access including the acquisition of a permanent ultrasound image documenting patency of the accessed vessel. The microwire was utilized to measure appropriate catheter length. A subcutaneous port pocket was then created along the upper chest wall utilizing a combination of sharp and blunt dissection. The pocket was irrigated with sterile saline. A single lumen ISP power injectable port was chosen for placement. The 8 Fr catheter was tunneled from the port pocket site to the venotomy incision. The port was placed in the pocket. The external catheter was trimmed to appropriate length. At the venotomy, an 8 Fr peel-away sheath was placed over a guidewire under fluoroscopic guidance. The catheter was then placed through the sheath and the  sheath was removed. Final catheter positioning was confirmed and documented with a fluoroscopic spot radiograph. The port was accessed with a Huber needle, aspirated and flushed with heparinized saline. The venotomy site was closed with an interrupted 4-0 Vicryl suture. The port pocket incision was closed with interrupted 2-0 Vicryl suture and  the skin was opposed with a running subcuticular 4-0 Vicryl suture. Dermabond and Steri-strips were applied to both incisions. Dressings were placed. The patient tolerated the procedure well without immediate post procedural complication. FINDINGS: After catheter placement, the tip lies within the superior cavoatrial junction. The catheter aspirates and flushes normally and is ready for immediate use. IMPRESSION: Successful placement of a right internal jugular approach power injectable Port-A-Cath. The catheter is ready for immediate use. Electronically Signed   By: Sandi Mariscal M.D.   On: 05/09/2017 13:29      IMPRESSION/PLAN: 1. 66 y.o. woman with adenocarcinoma of the left lung, stage IV ([place]), with liver and brain metastases.  She remains on immunotherapy with Keytruda, under the care and direction of Dr. Julien Nordmann.  She will continue close follow up with Dr. Julien Nordmann and we will continue to follow her progress through correspondence with medical oncology.  2. Brain mets. Recent MRI brain 05/08/17 shows stability of all treated lesions.  No new lesions were noted.  We will repeat MRI brain in 3 months and plan to present her case at multidisciplinary conference.  She will follow up to review results.  She knows to call sooner with any questions or concerns.    Nicholos Johns, PA-C

## 2017-05-12 ENCOUNTER — Ambulatory Visit (HOSPITAL_COMMUNITY)
Admission: RE | Admit: 2017-05-12 | Discharge: 2017-05-12 | Disposition: A | Discharge status: Home / Self Care | Payer: BLUE CROSS/BLUE SHIELD | Source: Ambulatory Visit | Attending: Internal Medicine | Admitting: Internal Medicine

## 2017-05-12 ENCOUNTER — Encounter (HOSPITAL_COMMUNITY): Payer: Self-pay

## 2017-05-12 DIAGNOSIS — C7931 Secondary malignant neoplasm of brain: Secondary | ICD-10-CM | POA: Diagnosis not present

## 2017-05-12 DIAGNOSIS — C787 Secondary malignant neoplasm of liver and intrahepatic bile duct: Secondary | ICD-10-CM | POA: Diagnosis not present

## 2017-05-12 DIAGNOSIS — C3490 Malignant neoplasm of unspecified part of unspecified bronchus or lung: Secondary | ICD-10-CM | POA: Diagnosis not present

## 2017-05-12 DIAGNOSIS — C7951 Secondary malignant neoplasm of bone: Secondary | ICD-10-CM

## 2017-05-12 DIAGNOSIS — C3492 Malignant neoplasm of unspecified part of left bronchus or lung: Secondary | ICD-10-CM | POA: Insufficient documentation

## 2017-05-12 DIAGNOSIS — Z5112 Encounter for antineoplastic immunotherapy: Secondary | ICD-10-CM | POA: Diagnosis not present

## 2017-05-12 DIAGNOSIS — C229 Malignant neoplasm of liver, not specified as primary or secondary: Secondary | ICD-10-CM | POA: Diagnosis not present

## 2017-05-12 MED ORDER — IOPAMIDOL (ISOVUE-300) INJECTION 61%
100.0000 mL | Freq: Once | INTRAVENOUS | Status: AC | PRN
  Administered 2017-05-12: 80 mL via INTRAVENOUS

## 2017-05-12 MED ORDER — IOPAMIDOL (ISOVUE-300) INJECTION 61%
INTRAVENOUS | Status: AC
  Filled 2017-05-12: qty 100

## 2017-05-12 MED ORDER — HEPARIN SOD (PORK) LOCK FLUSH 100 UNIT/ML IV SOLN
500.0000 [IU] | Freq: Once | INTRAVENOUS | Status: AC
  Administered 2017-05-12: 500 [IU] via INTRAVENOUS

## 2017-05-12 MED ORDER — HEPARIN SOD (PORK) LOCK FLUSH 100 UNIT/ML IV SOLN
INTRAVENOUS | Status: AC
  Filled 2017-05-12: qty 5

## 2017-05-22 ENCOUNTER — Telehealth: Payer: Self-pay | Admitting: Medical Oncology

## 2017-05-22 ENCOUNTER — Other Ambulatory Visit: Payer: Self-pay | Admitting: Medical Oncology

## 2017-05-22 DIAGNOSIS — Z95828 Presence of other vascular implants and grafts: Secondary | ICD-10-CM

## 2017-05-22 MED ORDER — LIDOCAINE-PRILOCAINE 2.5-2.5 % EX CREA
1.0000 | TOPICAL_CREAM | CUTANEOUS | 0 refills | Status: AC | PRN
Start: 2017-05-22 — End: ?

## 2017-05-22 NOTE — Telephone Encounter (Signed)
Sent  rx for emla cream and sent schedule request for flush appts. Left this message for pt.

## 2017-05-24 ENCOUNTER — Telehealth: Payer: Self-pay | Admitting: *Deleted

## 2017-05-24 NOTE — Telephone Encounter (Signed)
Voicemail received from patient in regard to questions about port-a-cath.  "I have a flush appointment at 10:45 am, when do I put the Emla Cream?"    Flush scheduled at 11:45 am.  Verified plan with collaborative and scheduler.  No scheduling availability for flush to draw labs.    Informed patient to expect lab to draw blood work from her arm.  Apply Emla cream at 10:00 am for Infusion nurse to access [facility] before 12:00 treatment.  Flush at 11:45 will be cancelled.

## 2017-05-25 ENCOUNTER — Encounter: Payer: Self-pay | Admitting: Internal Medicine

## 2017-05-25 ENCOUNTER — Ambulatory Visit (HOSPITAL_BASED_OUTPATIENT_CLINIC_OR_DEPARTMENT_OTHER): Payer: BLUE CROSS/BLUE SHIELD | Admitting: Internal Medicine

## 2017-05-25 ENCOUNTER — Other Ambulatory Visit (HOSPITAL_BASED_OUTPATIENT_CLINIC_OR_DEPARTMENT_OTHER): Payer: BLUE CROSS/BLUE SHIELD

## 2017-05-25 ENCOUNTER — Telehealth: Payer: Self-pay | Admitting: Internal Medicine

## 2017-05-25 ENCOUNTER — Ambulatory Visit (HOSPITAL_BASED_OUTPATIENT_CLINIC_OR_DEPARTMENT_OTHER): Payer: BLUE CROSS/BLUE SHIELD

## 2017-05-25 VITALS — BP 138/68 | HR 83 | Temp 99.0°F | Resp 16 | Ht 64.5 in | Wt 146.1 lb

## 2017-05-25 DIAGNOSIS — C7931 Secondary malignant neoplasm of brain: Secondary | ICD-10-CM

## 2017-05-25 DIAGNOSIS — C7972 Secondary malignant neoplasm of left adrenal gland: Secondary | ICD-10-CM

## 2017-05-25 DIAGNOSIS — C787 Secondary malignant neoplasm of liver and intrahepatic bile duct: Secondary | ICD-10-CM | POA: Diagnosis not present

## 2017-05-25 DIAGNOSIS — C3412 Malignant neoplasm of upper lobe, left bronchus or lung: Secondary | ICD-10-CM | POA: Diagnosis not present

## 2017-05-25 DIAGNOSIS — Z79899 Other long term (current) drug therapy: Secondary | ICD-10-CM | POA: Diagnosis not present

## 2017-05-25 DIAGNOSIS — C7951 Secondary malignant neoplasm of bone: Secondary | ICD-10-CM | POA: Diagnosis not present

## 2017-05-25 DIAGNOSIS — Z5112 Encounter for antineoplastic immunotherapy: Secondary | ICD-10-CM

## 2017-05-25 DIAGNOSIS — R490 Dysphonia: Secondary | ICD-10-CM | POA: Diagnosis not present

## 2017-05-25 DIAGNOSIS — C3492 Malignant neoplasm of unspecified part of left bronchus or lung: Secondary | ICD-10-CM

## 2017-05-25 DIAGNOSIS — J3801 Paralysis of vocal cords and larynx, unilateral: Secondary | ICD-10-CM

## 2017-05-25 DIAGNOSIS — R066 Hiccough: Secondary | ICD-10-CM

## 2017-05-25 DIAGNOSIS — C7970 Secondary malignant neoplasm of unspecified adrenal gland: Secondary | ICD-10-CM

## 2017-05-25 LAB — COMPREHENSIVE METABOLIC PANEL
ALT: 15 U/L (ref 0–55)
AST: 20 U/L (ref 5–34)
Albumin: 3.7 g/dL (ref 3.5–5.0)
Alkaline Phosphatase: 105 U/L (ref 40–150)
Anion Gap: 13 mEq/L — ABNORMAL HIGH (ref 3–11)
BUN: 23 mg/dL (ref 7.0–26.0)
CHLORIDE: 104 meq/L (ref 98–109)
CO2: 22 meq/L (ref 22–29)
Calcium: 10.1 mg/dL (ref 8.4–10.4)
Creatinine: 1 mg/dL (ref 0.6–1.1)
EGFR: 57 mL/min/{1.73_m2} — AB (ref >90)
Glucose: 96 mg/dl (ref 70–140)
Potassium: 3.8 mEq/L (ref 3.5–5.1)
Sodium: 138 mEq/L (ref 136–145)
Total Bilirubin: 0.31 mg/dL (ref 0.20–1.20)
Total Protein: 8.2 g/dL (ref 6.4–8.3)

## 2017-05-25 LAB — CBC WITH DIFFERENTIAL/PLATELET
BASO%: 0.2 % (ref 0.0–2.0)
Basophils Absolute: 0 10*3/uL (ref 0.0–0.1)
EOS ABS: 1.4 10*3/uL — AB (ref 0.0–0.5)
EOS%: 9.3 % — ABNORMAL HIGH (ref 0.0–7.0)
HCT: 35.8 % (ref 34.8–46.6)
HGB: 11.8 g/dL (ref 11.6–15.9)
LYMPH%: 9.2 % — AB (ref 14.0–49.7)
MCH: 31.1 pg (ref 25.1–34.0)
MCHC: 33 g/dL (ref 31.5–36.0)
MCV: 94.2 fL (ref 79.5–101.0)
MONO#: 1.2 10*3/uL — AB (ref 0.1–0.9)
MONO%: 8.1 % (ref 0.0–14.0)
NEUT#: 10.6 10*3/uL — ABNORMAL HIGH (ref 1.5–6.5)
NEUT%: 73.2 % (ref 38.4–76.8)
PLATELETS: 290 10*3/uL (ref 145–400)
RBC: 3.8 10*6/uL (ref 3.70–5.45)
RDW: 14.6 % — ABNORMAL HIGH (ref 11.2–14.5)
WBC: 14.4 10*3/uL — ABNORMAL HIGH (ref 3.9–10.3)
lymph#: 1.3 10*3/uL (ref 0.9–3.3)

## 2017-05-25 LAB — TSH: TSH: 1.765 m(IU)/L (ref 0.308–3.960)

## 2017-05-25 LAB — TECHNOLOGIST REVIEW

## 2017-05-25 MED ORDER — HEPARIN SOD (PORK) LOCK FLUSH 100 UNIT/ML IV SOLN
500.0000 [IU] | Freq: Once | INTRAVENOUS | Status: AC | PRN
  Administered 2017-05-25: 500 [IU]
  Filled 2017-05-25: qty 5

## 2017-05-25 MED ORDER — PEMBROLIZUMAB CHEMO INJECTION 100 MG/4ML
200.0000 mg | Freq: Once | INTRAVENOUS | Status: AC
  Administered 2017-05-25: 200 mg via INTRAVENOUS
  Filled 2017-05-25: qty 8

## 2017-05-25 MED ORDER — SODIUM CHLORIDE 0.9% FLUSH
10.0000 mL | INTRAVENOUS | Status: DC | PRN
  Administered 2017-05-25: 10 mL
  Filled 2017-05-25: qty 10

## 2017-05-25 MED ORDER — SODIUM CHLORIDE 0.9 % IV SOLN
Freq: Once | INTRAVENOUS | Status: AC
  Administered 2017-05-25: 12:00:00 via INTRAVENOUS

## 2017-05-25 NOTE — Progress Notes (Signed)
[place] [phone]   [phone]  OFFICE PROGRESS NOTE  [name], MD [address] [place] [address]  DIAGNOSIS: Stage IVb (T1b, N2, M1c) non-small cell lung cancer, adenocarcinoma in a never smoker patient diagnosed in January 2018 and presented with left upper lobe lung mass, mediastinal lymphadenopathy as well as metastatic disease to the bone, liver, adrenal glands as well as abdominal wall nodules and brain metastasis. PDL 1 expression 70%.  Genomic Alterations Identified? KRAS G12V NOTCH2 rearrangement exon 27 BX03 splice site [id number]>V Additional Findings? Microsatellite status MS-Stable Tumor Mutation Burden TMB-Intermediate; 8 Muts/Mb Additional Disease-relevant Genes with No Reportable Alterations Identified? EGFR ALK BRAF MET RET ERBB2 ROS1  PRIOR THERAPY:  1) Palliative radiotherapy to the chest and sacrum under the care of Dr. Tammi Klippel. 2)  Systemic chemotherapy with carboplatin for AUC of 5, Alimta 500 MG/M2 and Avastin 15 MG/KG every 3 weeks. Status post 3 cycles. First dose was given 12/22/2016. Last cycle was given on 02/02/2017, discontinued secondary to disease progression.  CURRENT THERAPY: Treatment with immunotherapy with Ketruda (pembrolizumab) 200 MG IV every 3 weeks. First dose 03/02/2017. Status post 4 cycles.  INTERVAL HISTORY: Karen Vasquez 66 y.o. female returns to the clinic today for follow-up visit accompanied by her son. The patient is feeling fine today with no specific complaints except for the persistent hoarseness of her voice and intermittent hiccups. 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 The patient tolerated the last cycle of her treatment fairly well was no significant adverse effects. She denied having any chest pain, shortness of breath, cough or hemoptysis. She denied having any fever or chills. She has no nausea,  vomiting, diarrhea or constipation. She had few episodes of diarrhea and nausea after her oral contrast for the scan few weeks ago. She had repeat CT scan of the chest, abdomen and pelvis performed recently and she is here for evaluation and discussion of the scan results and treatment options.  MEDICAL HISTORY: Past Medical History:  Diagnosis Date  . Adenocarcinoma of left lung, stage 4 ([place]) 12/16/2016  . Arthritis   . Asthma due to seasonal allergies   . Chronic fatigue 02/23/2017  . Encounter for antineoplastic chemotherapy 12/17/2016  . GERD (gastroesophageal reflux disease)   . Goals of care, counseling/discussion 12/17/2016  . Hernia of abdominal wall    umbilical  . Hypertension 01/09/2017  . No pertinent past medical history    COLD UPPER RESP STARTED [facility] 6/14  . Recurrent upper respiratory infection (URI)    recent sinus inf  . Sinus infection 02/25/2017  . Thyroid nodule   . Urine blood    hematuria    ALLERGIES:  is allergic to prednisone; fenofibrate; quinolones; statins; and septra [sulfamethoxazole-trimethoprim].  MEDICATIONS:  Current Outpatient Prescriptions  Medication Sig Dispense Refill  . amoxicillin-clavulanate (AUGMENTIN) 875-125 MG tablet Take 1 tablet by mouth 2 (two) times daily. 14 tablet 0  . aspirin EC 81 MG tablet Take 81 mg by mouth daily.    . calcium gluconate 500 MG tablet Take 1 tablet by mouth 2 (two) times daily.    . Cholecalciferol (VITAMIN D) 2000 units tablet Take 2,000 Units by mouth daily.    Marland Kitchen conjugated estrogens (PREMARIN) vaginal cream Place 1 Applicatorful vaginally daily.    . Cranberry 500 MG TABS Take 1 tablet by mouth daily.     Marland Kitchen ezetimibe (  ZETIA) 10 MG tablet Take 10 mg by mouth daily.    Marland Kitchen ketotifen (ZADITOR) 0.025 % ophthalmic solution Place 1 drop into both eyes 2 (two) times daily.    Marland Kitchen lidocaine-prilocaine (EMLA) cream Apply 1 application topically as needed. Apply 1-2 tsp 1-2 hours prior to labs or chemotherapy 30 g 0  .  loratadine (CLARITIN) 10 MG tablet Take 10 mg by mouth daily.    Marland Kitchen LORazepam (ATIVAN) 0.5 MG tablet Take 1 tablet (0.5 mg total) by mouth every 8 (eight) hours. 30 tablet 0  . LORazepam (ATIVAN) 0.5 MG tablet 1 tablet po 30 minutes prior to radiation or MRI 30 tablet 0  . Multiple Vitamin (MULTIVITAMIN) tablet Take 1 tablet by mouth daily.    . ondansetron (ZOFRAN) 8 MG tablet Take 1 tablet (8 mg total) by mouth every 8 (eight) hours as needed for nausea or vomiting. (Patient not taking: Reported on 05/11/2017) 30 tablet 0  . oxyCODONE (OXY IR/ROXICODONE) 5 MG immediate release tablet Take 1-3 tablets (5-15 mg total) by mouth every 4 (four) hours as needed for severe pain. 100 tablet 0  . pantoprazole (PROTONIX) 40 MG tablet Take 40 mg by mouth 2 (two) times daily.     . prochlorperazine (COMPAZINE) 10 MG tablet TAKE ONE TABLET BY MOUTH EVERY 6 HOURS AS NEEDED FOR NAUSEA/VOMITING (Patient not taking: Reported on 05/11/2017) 30 tablet 0  . raloxifene (EVISTA) 60 MG tablet Take 60 mg by mouth daily.    . sodium chloride (OCEAN) 0.65 % nasal spray Place 1 spray into the nose daily as needed for congestion.     . temazepam (RESTORIL) 30 MG capsule Take 1 capsule (30 mg total) by mouth at bedtime as needed for sleep. 30 capsule 0  . trimethoprim (TRIMPEX) 100 MG tablet Take 100 mg by mouth daily.     Marland Kitchen VASCEPA 1 g CAPS Take 1 tablet by mouth 2 (two) times daily.    . vitamin C (ASCORBIC ACID) 250 MG tablet Take 250 mg by mouth daily.     No current facility-administered medications for this visit.     SURGICAL HISTORY:  Past Surgical History:  Procedure Laterality Date  . ABDOMINAL HYSTERECTOMY  08   vag, rectocele repair , bladder sling  . ANTERIOR CERVICAL DECOMP/DISCECTOMY FUSION  05/09/2012   Procedure: ANTERIOR CERVICAL DECOMPRESSION/DISCECTOMY FUSION 1 LEVEL;  Surgeon: Eustace Moore, MD;  Location: [facility];  Service: Neurosurgery;  Laterality: N/A;  anterior cervical decompression fusion  cervical five-six  . BREAST CYST EXCISION  96   fibroadenoma lft  . BREAST SURGERY     03  . CARPAL TUNNEL RELEASE  05   bil  . DILATION AND CURETTAGE OF UTERUS  03    polyps  . EYE SURGERY     cataracts bilat; left eye surgery   . IR FLUORO GUIDE PORT INSERTION RIGHT  05/09/2017  . IR US GUIDE VASC ACCESS RIGHT  05/09/2017  . TUBAL LIGATION  88    REVIEW OF SYSTEMS:  Constitutional: negative Eyes: negative Ears, nose, mouth, throat, and face: positive for hoarseness Respiratory: negative Cardiovascular: negative Gastrointestinal: negative Genitourinary:negative Integument/breast: negative Hematologic/lymphatic: negative Musculoskeletal:positive for arthralgias and neck pain Neurological: negative Behavioral/Psych: negative Endocrine: negative Allergic/Immunologic: negative   PHYSICAL EXAMINATION: General appearance: alert, cooperative and no distress Head: Normocephalic, without obvious abnormality, atraumatic Neck: no adenopathy, no JVD, supple, symmetrical, trachea midline and thyroid not enlarged, symmetric, no tenderness/mass/nodules Lymph nodes: Cervical, supraclavicular, and axillary nodes normal. Resp:  clear to auscultation bilaterally Back: symmetric, no curvature. ROM normal. No CVA tenderness. Cardio: regular rate and rhythm, S1, S2 normal, no murmur, click, rub or gallop GI: soft, non-tender; bowel sounds normal; no masses,  no organomegaly Extremities: extremities normal, atraumatic, no cyanosis or edema Neurologic: Alert and oriented X 3, normal strength and tone. Normal symmetric reflexes. Normal coordination and gait  ECOG PERFORMANCE STATUS: 1 - Symptomatic but completely ambulatory  Blood pressure 138/68, pulse 83, temperature 99 F (37.2 C), temperature source Oral, resp. rate 16, height 5' 4.5" (1.638 m), weight 146 lb 1.6 oz (66.3 kg), SpO2 95 %.  LABORATORY DATA: Lab Results  Component Value Date   WBC 14.4 (H) 05/25/2017   HGB 11.8 05/25/2017    HCT 35.8 05/25/2017   MCV 94.2 05/25/2017   PLT 290 05/25/2017      Chemistry      Component Value Date/Time   NA 141 05/09/2017 1026   NA 140 05/04/2017 1018   K 3.7 05/09/2017 1026   K 3.7 05/04/2017 1018   CL 106 05/09/2017 1026   CO2 24 05/09/2017 1026   CO2 21 (L) 05/04/2017 1018   BUN 17 05/09/2017 1026   BUN 23.9 05/04/2017 1018   CREATININE 0.78 05/09/2017 1026   CREATININE 1.4 (H) 05/04/2017 1018      Component Value Date/Time   CALCIUM 9.4 05/09/2017 1026   CALCIUM 10.3 05/04/2017 1018   ALKPHOS 97 05/04/2017 1018   AST 19 05/04/2017 1018   ALT 17 05/04/2017 1018   BILITOT 0.32 05/04/2017 1018       RADIOGRAPHIC STUDIES: Ct Chest W Contrast  Result Date: 05/12/2017 CLINICAL DATA:  66 year old female with history of left-sided lung cancer with metastatic disease to the bones, liver, adrenal glands and brain. Chemotherapy and radiation therapy complete March 2018 ongoing [place] therapy. Followup study. EXAM: CT CHEST, ABDOMEN, AND PELVIS WITH CONTRAST TECHNIQUE: Multidetector CT imaging of the chest, abdomen and pelvis was performed following the standard protocol during bolus administration of intravenous contrast. CONTRAST:  25m ISOVUE-300 IOPAMIDOL (ISOVUE-300) INJECTION 61% COMPARISON:  CT the chest, abdomen and pelvis 02/21/2017. FINDINGS: CT CHEST FINDINGS Cardiovascular: Heart size is normal. There is no significant pericardial fluid, thickening or pericardial calcification. Aortic atherosclerosis. No definite atherosclerotic calcifications noted in the coronary arteries. Right internal jugular single-lumen porta cath with tip terminating in the inferior aspect of the right atrium. Mediastinum/Nodes: There continues be prominent nodal tissue in the mediastinal and hilar nodal stations, most evident in the AP window where this measures up to 11 mm in thickness (slightly decreased compared to the prior study). Small hiatal hernia. No axillary lymphadenopathy.  Lungs/Pleura: New patchy areas of septal thickening, ground-glass attenuation and architectural distortion, most evident throughout the medial aspect of the upper left lung, particularly in the superior segment of the left lower lobe, most compatible with evolving postradiation changes. Node discrete suspicious-appearing pulmonary nodules or masses are noted on today's examination. No pleural effusions. Musculoskeletal: Multiple predominantly sclerotic lesions are noted throughout the visualized axial and appendicular skeleton, compatible with widespread metastatic disease to the bones. This is most evident at T12 where there is a pathologic compression fracture with approximately 50% loss of anterior vertebral body height which appears similar to the prior examination. The overall burden of metastatic disease to the bones is similar to the prior study. CT ABDOMEN PELVIS FINDINGS Hepatobiliary: Multiple hepatic lesions are again noted. These appear slightly smaller than the prior examination, and are generally more low-attenuation in appearance  than the prior study, suggesting internal areas of necrosis. The largest lesion is in the superior aspect of the right lobe of the liver between segments 6 and 7 measuring up to 3.6 cm in diameter (previously 4.2 x 4.4 cm) on axial image 40 of series 2. No new hepatic lesions are noted. No intra or extrahepatic biliary ductal dilatation. Gallbladder is normal in appearance. Pancreas: No pancreatic mass. No pancreatic ductal dilatation. No pancreatic or peripancreatic fluid or inflammatory changes. Spleen: Unremarkable. Adrenals/Urinary Tract: Bilateral adrenal masses have significantly increased in size compared to the prior study, the largest of which is on the left side currently measuring 3.4 x 4.1 cm (previously 2.5 x 1.9 cm). Left kidney is normal in appearance. 5.2 cm simple cyst in the lower pole of the right kidney incidentally noted. No hydroureteronephrosis. 1.7 cm  calculus in the right side of the urinary bladder is similar to the prior study. Urinary bladder is otherwise unremarkable in appearance. Stomach/Bowel: Normal appearance of the intraabdominal portion of the stomach. No pathologic dilatation of small bowel or colon. Normal appendix. Vascular/Lymphatic: No significant atherosclerotic disease, aneurysm or dissection noted in the abdominal or pelvic vasculature. No lymphadenopathy identified in the abdomen or pelvis. Reproductive: Status posthysterectomy. Pessary in the vagina. Ovaries are atrophic. Other: No significant volume of ascites. No pneumoperitoneum. Small paraumbilical ventral hernia containing only omental fat. Musculoskeletal: Numerous predominantly sclerotic osseous lesions appear similar in number and size to the prior examination, compatible with widespread metastatic disease to the bones. IMPRESSION: 1. Today's study demonstrates a mixed response to therapy. While there has generally been regression of disease in the thorax and throughout the liver, there has been interval enlargement of bilateral adrenal lesions. Osseous metastatic disease appears very similar to the prior study. 2. Additional incidental findings, similar to prior studies, as above. Electronically Signed   By: Vinnie Langton M.D.   On: 05/12/2017 14:18   Mr Jeri Cos ZO Contrast  Result Date: 05/08/2017 CLINICAL DATA:  Metastatic lung cancer. Stereotactic radiosurgery follow-up. EXAM: MRI HEAD WITHOUT AND WITH CONTRAST TECHNIQUE: Multiplanar, multiecho pulse sequences of the brain and surrounding structures were obtained without and with intravenous contrast. CONTRAST:  56m MULTIHANCE GADOBENATE DIMEGLUMINE 529 MG/ML IV SOLN COMPARISON:  MRI 02/06/2017, 01/01/2017 FINDINGS: Brain: Stable treated metastatic lesions in the brain. Right choroid lesion stable. Right choroid slightly larger than the left. Left posterior frontal lesion, stable with minimal enhancement Right parietal  lesion no longer visualized Right cerebellar lesion stable without abnormal enhancement. Left frontal convexity dural based enhancing lesion 10 mm unchanged most compatible with meningioma. Ventricle size normal. No acute infarct. Mild chronic microvascular ischemic change in the white matter. Vascular: Normal arterial flow void Skull and upper cervical spine: Stable right frontal bone hemangioma. No new skull lesions. Sinuses/Orbits: Mild mucosal edema paranasal sinuses Other: Not IMPRESSION: Stable metastatic disease in the brain.  No new or enlarging tumor. Stable 1 cm left frontal convexity meningioma. Electronically Signed   By: CFranchot GalloM.D.   On: 05/08/2017 15:06   Ct Abdomen Pelvis W Contrast  Result Date: 05/12/2017 CLINICAL DATA:  66year old female with history of left-sided lung cancer with metastatic disease to the bones, liver, adrenal glands and brain. Chemotherapy and radiation therapy complete March 2018 [facility] Followup study. EXAM: CT CHEST, ABDOMEN, AND PELVIS WITH CONTRAST TECHNIQUE: Multidetector CT imaging of the chest, abdomen and pelvis was performed following the standard protocol during bolus administration of intravenous contrast. CONTRAST:  840mISOVUE-300 IOPAMIDOL (ISOVUE-300)  INJECTION 61% COMPARISON:  CT the chest, abdomen and pelvis 02/21/2017. FINDINGS: CT CHEST FINDINGS Cardiovascular: Heart size is normal. There is no significant pericardial fluid, thickening or pericardial calcification. Aortic atherosclerosis. No definite atherosclerotic calcifications noted in the coronary arteries. Right internal jugular single-lumen porta cath with tip terminating in the inferior aspect of the right atrium. Mediastinum/Nodes: There continues be prominent nodal tissue in the mediastinal and hilar nodal stations, most evident in the AP window where this measures up to 11 mm in thickness (slightly decreased compared to the prior study). Small hiatal hernia. No  axillary lymphadenopathy. Lungs/Pleura: New patchy areas of septal thickening, ground-glass attenuation and architectural distortion, most evident throughout the medial aspect of the upper left lung, particularly in the superior segment of the left lower lobe, most compatible with evolving postradiation changes. Node discrete suspicious-appearing pulmonary nodules or masses are noted on today's examination. No pleural effusions. Musculoskeletal: Multiple predominantly sclerotic lesions are noted throughout the visualized axial and appendicular skeleton, compatible with widespread metastatic disease to the bones. This is most evident at T12 where there is a pathologic compression fracture with approximately 50% loss of anterior vertebral body height which appears similar to the prior examination. The overall burden of metastatic disease to the bones is similar to the prior study. CT ABDOMEN PELVIS FINDINGS Hepatobiliary: Multiple hepatic lesions are again noted. These appear slightly smaller than the prior examination, and are generally more low-attenuation in appearance than the prior study, suggesting internal areas of necrosis. The largest lesion is in the superior aspect of the right lobe of the liver between segments 6 and 7 measuring up to 3.6 cm in diameter (previously 4.2 x 4.4 cm) on axial image 40 of series 2. No new hepatic lesions are noted. No intra or extrahepatic biliary ductal dilatation. Gallbladder is normal in appearance. Pancreas: No pancreatic mass. No pancreatic ductal dilatation. No pancreatic or peripancreatic fluid or inflammatory changes. Spleen: Unremarkable. Adrenals/Urinary Tract: Bilateral adrenal masses have significantly increased in size compared to the prior study, the largest of which is on the left side currently measuring 3.4 x 4.1 cm (previously 2.5 x 1.9 cm). Left kidney is normal in appearance. 5.2 cm simple cyst in the lower pole of the right kidney incidentally noted. No  hydroureteronephrosis. 1.7 cm calculus in the right side of the urinary bladder is similar to the prior study. Urinary bladder is otherwise unremarkable in appearance. Stomach/Bowel: Normal appearance of the intraabdominal portion of the stomach. No pathologic dilatation of small bowel or colon. Normal appendix. Vascular/Lymphatic: No significant atherosclerotic disease, aneurysm or dissection noted in the abdominal or pelvic vasculature. No lymphadenopathy identified in the abdomen or pelvis. Reproductive: Status posthysterectomy. Pessary in the vagina. Ovaries are atrophic. Other: No significant volume of ascites. No pneumoperitoneum. Small paraumbilical ventral hernia containing only omental fat. Musculoskeletal: Numerous predominantly sclerotic osseous lesions appear similar in number and size to the prior examination, compatible with widespread metastatic disease to the bones. IMPRESSION: 1. Today's study demonstrates a mixed response to therapy. While there has generally been regression of disease in the thorax and throughout the liver, there has been interval enlargement of bilateral adrenal lesions. Osseous metastatic disease appears very similar to the prior study. 2. Additional incidental findings, similar to prior studies, as above. Electronically Signed   By: Vinnie Langton M.D.   On: 05/12/2017 14:18   Ir US Guide Vasc Access Right  Result Date: 05/09/2017 INDICATION: History of adenocarcinoma of the lung. In need of durable intravenous access for  chemotherapy administration. EXAM: IMPLANTED PORT A CATH PLACEMENT WITH ULTRASOUND AND FLUOROSCOPIC GUIDANCE COMPARISON:  Chest CT - 02/21/2017 MEDICATIONS: Ancef 2 gm IV; The antibiotic was administered within an appropriate time interval prior to skin puncture. ANESTHESIA/SEDATION: Moderate (conscious) sedation was employed during this procedure. A total of Versed 2 mg and Fentanyl 100 mcg was administered intravenously. Moderate Sedation Time: 25  minutes. The patient's level of consciousness and vital signs were monitored continuously by radiology nursing throughout the procedure under my direct supervision. CONTRAST:  None FLUOROSCOPY TIME:  24 seconds (5 mGy) COMPLICATIONS: None immediate. PROCEDURE: The procedure, risks, benefits, and alternatives were explained to the patient. Questions regarding the procedure were encouraged and answered. The patient understands and consents to the procedure. The right neck and chest were prepped with chlorhexidine in a sterile fashion, and a sterile drape was applied covering the operative field. Maximum barrier sterile technique with sterile gowns and gloves were used for the procedure. A timeout was performed prior to the initiation of the procedure. Local anesthesia was provided with 1% lidocaine with epinephrine. After creating a small venotomy incision, a micropuncture kit was utilized to access the internal jugular vein. Real-time ultrasound guidance was utilized for vascular access including the acquisition of a permanent ultrasound image documenting patency of the accessed vessel. The microwire was utilized to measure appropriate catheter length. A subcutaneous port pocket was then created along the upper chest wall utilizing a combination of sharp and blunt dissection. The pocket was irrigated with sterile saline. A single lumen ISP power injectable port was chosen for placement. The 8 Fr catheter was tunneled from the port pocket site to the venotomy incision. The port was placed in the pocket. The external catheter was trimmed to appropriate length. At the venotomy, an 8 Fr peel-away sheath was placed over a guidewire under fluoroscopic guidance. The catheter was then placed through the sheath and the sheath was removed. Final catheter positioning was confirmed and documented with a fluoroscopic spot radiograph. The port was accessed with a Huber needle, aspirated and flushed with heparinized saline. The  venotomy site was closed with an interrupted 4-0 Vicryl suture. The port pocket incision was closed with interrupted 2-0 Vicryl suture and the skin was opposed with a running subcuticular 4-0 Vicryl suture. Dermabond and Steri-strips were applied to both incisions. Dressings were placed. The patient tolerated the procedure well without immediate post procedural complication. FINDINGS: After catheter placement, the tip lies within the superior cavoatrial junction. The catheter aspirates and flushes normally and is ready for immediate use. IMPRESSION: Successful placement of a right internal jugular approach power injectable Port-A-Cath. The catheter is ready for immediate use. Electronically Signed   By: Sandi Mariscal M.D.   On: 05/09/2017 13:29   Ir Fluoro Guide Port Insertion Right  Result Date: 05/09/2017 INDICATION: History of adenocarcinoma of the lung. In need of durable intravenous access for chemotherapy administration. EXAM: IMPLANTED PORT A CATH PLACEMENT WITH ULTRASOUND AND FLUOROSCOPIC GUIDANCE COMPARISON:  Chest CT - 02/21/2017 MEDICATIONS: Ancef 2 gm IV; The antibiotic was administered within an appropriate time interval prior to skin puncture. ANESTHESIA/SEDATION: Moderate (conscious) sedation was employed during this procedure. A total of Versed 2 mg and Fentanyl 100 mcg was administered intravenously. Moderate Sedation Time: 25 minutes. The patient's level of consciousness and vital signs were monitored continuously by radiology nursing throughout the procedure under my direct supervision. CONTRAST:  None FLUOROSCOPY TIME:  24 seconds (5 mGy) COMPLICATIONS: None immediate. PROCEDURE: The procedure, risks, benefits, and alternatives  were explained to the patient. Questions regarding the procedure were encouraged and answered. The patient understands and consents to the procedure. The right neck and chest were prepped with chlorhexidine in a sterile fashion, and a sterile drape was applied covering  the operative field. Maximum barrier sterile technique with sterile gowns and gloves were used for the procedure. A timeout was performed prior to the initiation of the procedure. Local anesthesia was provided with 1% lidocaine with epinephrine. After creating a small venotomy incision, a micropuncture kit was utilized to access the internal jugular vein. Real-time ultrasound guidance was utilized for vascular access including the acquisition of a permanent ultrasound image documenting patency of the accessed vessel. The microwire was utilized to measure appropriate catheter length. A subcutaneous port pocket was then created along the upper chest wall utilizing a combination of sharp and blunt dissection. The pocket was irrigated with sterile saline. A single lumen ISP power injectable port was chosen for placement. The 8 Fr catheter was tunneled from the port pocket site to the venotomy incision. The port was placed in the pocket. The external catheter was trimmed to appropriate length. At the venotomy, an 8 Fr peel-away sheath was placed over a guidewire under fluoroscopic guidance. The catheter was then placed through the sheath and the sheath was removed. Final catheter positioning was confirmed and documented with a fluoroscopic spot radiograph. The port was accessed with a Huber needle, aspirated and flushed with heparinized saline. The venotomy site was closed with an interrupted 4-0 Vicryl suture. The port pocket incision was closed with interrupted 2-0 Vicryl suture and the skin was opposed with a running subcuticular 4-0 Vicryl suture. Dermabond and Steri-strips were applied to both incisions. Dressings were placed. The patient tolerated the procedure well without immediate post procedural complication. FINDINGS: After catheter placement, the tip lies within the superior cavoatrial junction. The catheter aspirates and flushes normally and is ready for immediate use. IMPRESSION: Successful placement of a  right internal jugular approach power injectable Port-A-Cath. The catheter is ready for immediate use. Electronically Signed   By: Sandi Mariscal M.D.   On: 05/09/2017 13:29    ASSESSMENT AND PLAN:  This is a very pleasant 66 years old white female with a stage IV non-small cell lung cancer was no actual mutations anteriorly 1 suppression of 70%. She was initially started on systemic chemotherapy with carboplatin, Alimta and Avastin for 3 cycles but this was discontinued secondary to disease progression. She is currently on treatment with immunotherapy with Ketruda (pembrolizumab) status post 4 cycles. She has been tolerating this treatment well with no significant adverse effects. She had a recent CT scan of the chest, abdomen and pelvis. I personally and independently reviewed the scan images and discuss the result with the patient and her son. Her scan showed mixed response with improvement of her disease in the lung and liver but unfortunately there was some enlargement of the adrenal gland especially the left adrenal gland. I discussed with the patient and her treatment options and recommended for her to continue her current treatment with immunotherapy for now. We will continue to monitor the adrenal gland lesions closely on the upcoming scan and there is any further progression in these lesions, we'll consider her for palliative radiotherapy to the adrenal gland lesions. For the hoarseness of her voice, she was advised to contact Dr. Janace Hoard for reevaluation. 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 For the hiccups, I gave the patient the option of treatment with Thorazine or baclofen but she would like to hold on this treatment for now since the hiccups is not severe at this point. The patient come back for follow-up visit in 3 weeks for evaluation before starting cycle #6. She was advised to  call immediately if she has any concerning symptoms in the interval. The patient voices understanding of current disease status and treatment options and is in agreement with the current care plan. All questions were answered. The patient knows to call the clinic with any problems, questions or concerns. We can certainly see the patient much sooner if necessary.  Disclaimer: This note was dictated with voice recognition software. Similar sounding words can inadvertently be transcribed and may not be corrected upon review.

## 2017-05-25 NOTE — Patient Instructions (Addendum)
[facility] Discharge Instructions for Patients Receiving Chemotherapy  Today you received the following chemotherapy agents Keytruda  To help prevent nausea and vomiting after your treatment, we encourage you to take your nausea medication as prescribed.  If you develop nausea and vomiting that is not controlled by your nausea medication, call the clinic.   BELOW ARE SYMPTOMS THAT SHOULD BE REPORTED IMMEDIATELY:  *FEVER GREATER THAN 100.5 F  *CHILLS WITH OR WITHOUT FEVER  NAUSEA AND VOMITING THAT IS NOT CONTROLLED WITH YOUR NAUSEA MEDICATION  *UNUSUAL SHORTNESS OF BREATH  *UNUSUAL BRUISING OR BLEEDING  TENDERNESS IN MOUTH AND THROAT WITH OR WITHOUT PRESENCE OF ULCERS  *URINARY PROBLEMS  *BOWEL PROBLEMS  UNUSUAL RASH Items with * indicate a potential emergency and should be followed up as soon as possible.  Feel free to call the clinic you have any questions or concerns. The clinic phone number is [phone].  Please show the [place] at check-in to the Emergency Department and triage nurse.   Implanted [facility] Guide An implanted port is a type of central line that is placed under the skin. Central lines are used to provide IV access when treatment or nutrition needs to be given through a person's veins. Implanted ports are used for long-term IV access. An implanted port may be placed because:  You need IV medicine that would be irritating to the small veins in your hands or arms.  You need long-term IV medicines, such as antibiotics.  You need IV nutrition for a long period.  You need frequent blood draws for lab tests.  You need dialysis.  Implanted ports are usually placed in the chest area, but they can also be placed in the upper arm, the abdomen, or the leg. An implanted port has two main parts:  Reservoir. The reservoir is round and will appear as a small, raised area under your skin. The reservoir is the part where a needle is  inserted to give medicines or draw blood.  Catheter. The catheter is a thin, flexible tube that extends from the reservoir. The catheter is placed into a large vein. Medicine that is inserted into the reservoir goes into the catheter and then into the vein.  How will I care for my incision site? Do not get the incision site wet. Bathe or shower as directed by your health care provider. How is my port accessed? Special steps must be taken to access the port:  Before the port is accessed, a numbing cream can be placed on the skin. This helps numb the skin over the port site.  Your health care provider uses a sterile technique to access the port. ? Your health care provider must put on a mask and sterile gloves. ? The skin over your port is cleaned carefully with an antiseptic and allowed to dry. ? The port is gently pinched between sterile gloves, and a needle is inserted into the port.  Only "non-coring" port needles should be used to access the port. Once the port is accessed, a blood return should be checked. This helps ensure that the port is in the vein and is not clogged.  If your port needs to remain accessed for a constant infusion, a clear (transparent) bandage will be placed over the needle site. The bandage and needle will need to be changed every week, or as directed by your health care provider.  Keep the bandage covering the needle clean and dry. Do not get it wet.  Follow your health care provider's instructions on how to take a shower or bath while the port is accessed.  If your port does not need to stay accessed, no bandage is needed over the port.  What is flushing? Flushing helps keep the port from getting clogged. Follow your health care provider's instructions on how and when to flush the port. Ports are usually flushed with saline solution or a medicine called heparin. The need for flushing will depend on how the port is used.  If the port is used for intermittent  medicines or blood draws, the port will need to be flushed: ? After medicines have been given. ? After blood has been drawn. ? As part of routine maintenance.  If a constant infusion is running, the port may not need to be flushed.  How long will my port stay implanted? The port can stay in for as long as your health care provider thinks it is needed. When it is time for the port to come out, surgery will be done to remove it. The procedure is similar to the one performed when the port was put in. When should I seek immediate medical care? When you have an implanted port, you should seek immediate medical care if:  You notice a bad smell coming from the incision site.  You have swelling, redness, or drainage at the incision site.  You have more swelling or pain at the port site or the surrounding area.  You have a fever that is not controlled with medicine.  This information is not intended to replace advice given to you by your health care provider. Make sure you discuss any questions you have with your health care provider. Document Released: 10/31/2005 Document Revised: 04/07/2016 Document Reviewed: 07/08/2013 Elsevier Interactive Patient Education  2017 [employer].

## 2017-05-25 NOTE — Telephone Encounter (Signed)
No additional appt scheduled per 7/12 - appts already scheduled to the end of treatment plan - Gave patient AVS and calender .

## 2017-05-29 DIAGNOSIS — Z859 Personal history of malignant neoplasm, unspecified: Secondary | ICD-10-CM | POA: Diagnosis not present

## 2017-05-29 DIAGNOSIS — H35372 Puckering of macula, left eye: Secondary | ICD-10-CM | POA: Diagnosis not present

## 2017-05-29 DIAGNOSIS — H35342 Macular cyst, hole, or pseudohole, left eye: Secondary | ICD-10-CM | POA: Diagnosis not present

## 2017-05-29 DIAGNOSIS — Z961 Presence of intraocular lens: Secondary | ICD-10-CM | POA: Diagnosis not present

## 2017-06-06 ENCOUNTER — Emergency Department (HOSPITAL_COMMUNITY): Payer: BLUE CROSS/BLUE SHIELD

## 2017-06-06 ENCOUNTER — Encounter (HOSPITAL_COMMUNITY): Payer: Self-pay | Admitting: Emergency Medicine

## 2017-06-06 ENCOUNTER — Emergency Department (HOSPITAL_COMMUNITY)
Admission: EM | Admit: 2017-06-06 | Discharge: 2017-06-06 | Disposition: A | Discharge status: Home / Self Care | Payer: BLUE CROSS/BLUE SHIELD | Attending: Emergency Medicine | Admitting: Emergency Medicine

## 2017-06-06 DIAGNOSIS — R0602 Shortness of breath: Secondary | ICD-10-CM

## 2017-06-06 DIAGNOSIS — R079 Chest pain, unspecified: Secondary | ICD-10-CM | POA: Diagnosis present

## 2017-06-06 DIAGNOSIS — Z7982 Long term (current) use of aspirin: Secondary | ICD-10-CM | POA: Insufficient documentation

## 2017-06-06 DIAGNOSIS — Z79899 Other long term (current) drug therapy: Secondary | ICD-10-CM | POA: Insufficient documentation

## 2017-06-06 DIAGNOSIS — J45909 Unspecified asthma, uncomplicated: Secondary | ICD-10-CM | POA: Insufficient documentation

## 2017-06-06 DIAGNOSIS — R05 Cough: Secondary | ICD-10-CM | POA: Diagnosis not present

## 2017-06-06 DIAGNOSIS — K219 Gastro-esophageal reflux disease without esophagitis: Secondary | ICD-10-CM | POA: Diagnosis not present

## 2017-06-06 DIAGNOSIS — I10 Essential (primary) hypertension: Secondary | ICD-10-CM | POA: Diagnosis not present

## 2017-06-06 LAB — CBC WITH DIFFERENTIAL/PLATELET
Basophils Absolute: 0 10*3/uL (ref 0.0–0.1)
Basophils Relative: 0 %
Eosinophils Absolute: 1.6 10*3/uL — ABNORMAL HIGH (ref 0.0–0.7)
Eosinophils Relative: 8 %
HCT: 32.3 % — ABNORMAL LOW (ref 36.0–46.0)
Hemoglobin: 11.2 g/dL — ABNORMAL LOW (ref 12.0–15.0)
Lymphocytes Relative: 9 %
Lymphs Abs: 1.7 10*3/uL (ref 0.7–4.0)
MCH: 30.1 pg (ref 26.0–34.0)
MCHC: 34.7 g/dL (ref 30.0–36.0)
MCV: 86.8 fL (ref 78.0–100.0)
Monocytes Absolute: 1.6 10*3/uL — ABNORMAL HIGH (ref 0.1–1.0)
Monocytes Relative: 8 %
Neutro Abs: 15.4 10*3/uL — ABNORMAL HIGH (ref 1.7–7.7)
Neutrophils Relative %: 75 %
Platelets: 271 10*3/uL (ref 150–400)
RBC: 3.72 MIL/uL — ABNORMAL LOW (ref 3.87–5.11)
RDW: 14.8 % (ref 11.5–15.5)
WBC: 20.3 10*3/uL — ABNORMAL HIGH (ref 4.0–10.5)

## 2017-06-06 LAB — COMPREHENSIVE METABOLIC PANEL
ALT: 22 U/L (ref 14–54)
AST: 27 U/L (ref 15–41)
Albumin: 3.8 g/dL (ref 3.5–5.0)
Alkaline Phosphatase: 99 U/L (ref 38–126)
Anion gap: 13 (ref 5–15)
BUN: 22 mg/dL — ABNORMAL HIGH (ref 6–20)
CO2: 20 mmol/L — ABNORMAL LOW (ref 22–32)
Calcium: 9.9 mg/dL (ref 8.9–10.3)
Chloride: 105 mmol/L (ref 101–111)
Creatinine, Ser: 0.8 mg/dL (ref 0.44–1.00)
GFR calc Af Amer: >60 mL/min (ref >60)
GFR calc non Af Amer: >60 mL/min (ref >60)
Glucose, Bld: 101 mg/dL — ABNORMAL HIGH (ref 65–99)
Potassium: 4.1 mmol/L (ref 3.5–5.1)
Sodium: 138 mmol/L (ref 135–145)
Total Bilirubin: 0.6 mg/dL (ref 0.3–1.2)
Total Protein: 7.6 g/dL (ref 6.5–8.1)

## 2017-06-06 MED ORDER — HEPARIN SOD (PORK) LOCK FLUSH 100 UNIT/ML IV SOLN
500.0000 [IU] | Freq: Once | INTRAVENOUS | Status: AC
  Administered 2017-06-06: 500 [IU]
  Filled 2017-06-06: qty 5

## 2017-06-06 MED ORDER — DOXYCYCLINE HYCLATE 100 MG PO TABS
100.0000 mg | ORAL_TABLET | Freq: Once | ORAL | Status: AC
  Administered 2017-06-06: 100 mg via ORAL
  Filled 2017-06-06: qty 1

## 2017-06-06 MED ORDER — DOXYCYCLINE HYCLATE 100 MG PO CAPS: 100.0000 mg | ORAL_CAPSULE | Freq: Two times a day (BID) | ORAL | 0 refills | Status: AC

## 2017-06-06 NOTE — ED Notes (Signed)
Off floor for testing 

## 2017-06-06 NOTE — Discharge Instructions (Signed)
Please take all of your antibiotics until finished!   You may develop abdominal discomfort or diarrhea from the antibiotic.  You may help offset this with probiotics which you can buy or get in yogurt. Do not eat  or take the probiotics until 2 hours after your antibiotic. Continue taking your reflux medications as prescribed. Follow-up with your oncologist in 10 days as scheduled. Return to the ED if any concerning signs or symptoms develop.

## 2017-06-06 NOTE — ED Triage Notes (Signed)
Pt c/o abdominal cramps and discomfort last night, followed by reflux, pt reports concern that she aspirated her reflux due to hearing unusual sounds while breathing and coughing yellow mucus. 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  Last chemo infusion 05/25/17.

## 2017-06-06 NOTE — ED Provider Notes (Signed)
[facility] DEPT Provider Note   CSN: [id number] Arrival date & time: 06/06/17  4098     History   Chief Complaint No chief complaint on file.   HPI Karen Vasquez is a 66 y.o. 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 Chief complaint is acute onset, improved heartburn sensation which occurred last night. Patient states that she had dinner at around 5:30 PM and approximately one hour later developed nausea and crampy moderate upper abdominal pain which is typical of her usual reflux symptoms. She was able to fall asleep and awoke at approximately 10:30 PM with heartburn sensation which persisted throughout the night but has steadily improved. Endorses multiple normal bowel movements throughout the night and a few watery, nonbloody loose stools this morning. Endorses mild tenderness on palpation of the upper abdomen today. She states she thinks she may have aspirated some stomach contents into her lungs because "when I turned into my left eye can hear myself breathing ". She denies any increased shortness of breath, fevers (Tmax 99.36F last night), chest pain, vomiting, dysuria, hematuria, melena, hematochezia. She receives infusions of Keytruda every 3 weeks, last infusion was 12 days ago and patient tolerated this well. Dr. Julien Nordmann is her oncologist  The history is provided by the patient.    Past Medical History:  Diagnosis Date  . Adenocarcinoma of left lung, stage 4 ([place]) 12/16/2016  . Arthritis   . Asthma due to seasonal allergies   . Chronic fatigue 02/23/2017  . Encounter for antineoplastic chemotherapy 12/17/2016  . GERD (gastroesophageal reflux disease)   . Goals of care, counseling/discussion 12/17/2016  . Hernia of abdominal wall    umbilical  . Hypertension 01/09/2017  . No  pertinent past medical history    COLD UPPER RESP STARTED [facility]K 6/14  . Recurrent upper respiratory infection (URI)    recent sinus inf  . Sinus infection 02/25/2017  . Thyroid nodule   . Urine blood    hematuria    Patient Active Problem List   Diagnosis Date Noted  . Sinus infection 02/25/2017  . Chronic fatigue 02/23/2017  . Hypertension 01/09/2017  . Brain metastasis ([place]) 12/26/2016  . Bone metastasis ([place]) 12/26/2016  . 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 Hypersensitivity reaction 12/22/2016  . Encounter for antineoplastic immunotherapy 12/17/2016  . Encounter for antineoplastic chemotherapy 12/17/2016  . Adenocarcinoma of left lung, stage 4 ([place]) 12/16/2016  . Liver metastasis ([place]) 11/18/2016  . Unilateral vocal cord paralysis 11/10/2016    Past Surgical History:  Procedure Laterality Date  . ABDOMINAL HYSTERECTOMY  08   vag, rectocele repair , bladder sling  . ANTERIOR CERVICAL DECOMP/DISCECTOMY FUSION  05/09/2012   Procedure: ANTERIOR CERVICAL DECOMPRESSION/DISCECTOMY FUSION 1 LEVEL;  Surgeon: Eustace Moore, MD;  Location: [facility];  Service: Neurosurgery;  Laterality: N/A;  anterior cervical decompression fusion cervical five-six  . BREAST CYST EXCISION  96   fibroadenoma lft  . BREAST SURGERY     03  . CARPAL TUNNEL RELEASE  05   bil  . DILATION AND CURETTAGE OF UTERUS  03    polyps  . EYE SURGERY     cataracts bilat; left eye surgery   . IR FLUORO GUIDE PORT INSERTION RIGHT  05/09/2017  . IR US GUIDE VASC ACCESS RIGHT  05/09/2017  . TUBAL LIGATION  88    OB History    No data available       Home Medications    Prior to Admission medications   Medication Sig Start Date End Date Taking? Authorizing Provider  aspirin EC 81 MG tablet Take 81 mg by mouth daily.   Yes [provider]  calcium gluconate 500 MG tablet Take 1 tablet by mouth 2 (two) times daily.   Yes [provider]  Cholecalciferol (VITAMIN D) 2000 units  tablet Take 2,000 Units by mouth daily.   Yes [provider]  conjugated estrogens (PREMARIN) vaginal cream Place 1 Applicatorful vaginally daily.   Yes [provider]  Cranberry 500 MG TABS Take 1 tablet by mouth daily.    Yes [provider]  ezetimibe (ZETIA) 10 MG tablet Take 10 mg by mouth daily.   Yes [provider]  ketotifen (ZADITOR) 0.025 % ophthalmic solution Place 1 drop into both eyes 2 (two) times daily.   Yes [provider]  lidocaine-prilocaine (EMLA) cream Apply 1 application topically as needed. Apply 1-2 tsp 1-2 hours prior to labs or chemotherapy 05/22/17  Yes Curt Bears, MD  loratadine (CLARITIN) 10 MG tablet Take 10 mg by mouth daily.   Yes [provider]  LORazepam (ATIVAN) 0.5 MG tablet Take 1 tablet (0.5 mg total) by mouth every 8 (eight) hours. 02/16/17  Yes Curt Bears, MD  LORazepam (ATIVAN) 0.5 MG tablet 1 tablet po 30 minutes prior to radiation or MRI 02/23/17  Yes Curt Bears, MD  Multiple Vitamin (MULTIVITAMIN) tablet Take 1 tablet by mouth daily.   Yes [provider]  ondansetron (ZOFRAN) 8 MG tablet Take 1 tablet (8 mg total) by mouth every 8 (eight) hours as needed for nausea or vomiting. 01/16/17  Yes Curt Bears, MD  oxyCODONE (OXY IR/ROXICODONE) 5 MG immediate release tablet Take 1-3 tablets (5-15 mg total) by mouth every 4 (four) hours as needed for severe pain. 02/02/17  Yes Curt Bears, MD  pantoprazole (PROTONIX) 40 MG tablet Take 40 mg by mouth 2 (two) times daily.    Yes [provider]  prochlorperazine (COMPAZINE) 10 MG tablet TAKE ONE TABLET BY MOUTH EVERY 6 HOURS AS NEEDED FOR NAUSEA/VOMITING 01/16/17  Yes Curt Bears, MD  raloxifene (EVISTA) 60 MG tablet Take 60 mg by mouth daily.   Yes [provider]  sodium chloride (OCEAN) 0.65 % nasal spray Place 1 spray into the nose daily as needed for congestion.    Yes [provider]  temazepam  (RESTORIL) 30 MG capsule Take 1 capsule (30 mg total) by mouth at bedtime as needed for sleep. 04/21/17  Yes Curt Bears, MD  trimethoprim (TRIMPEX) 100 MG tablet Take 100 mg by mouth daily.    Yes [provider]  VASCEPA 1 g CAPS Take 1 tablet by mouth 2 (two) times daily. 03/09/17  Yes [provider]  vitamin C (ASCORBIC ACID) 250 MG tablet Take 250 mg by mouth daily.   Yes [provider]  doxycycline (VIBRAMYCIN) 100 MG capsule Take 1 capsule (100 mg total) by mouth 2 (two) times daily. 06/06/17 06/16/17  Renita Papa, PA-C    Family History Family History  Problem Relation Age of Onset  . Cancer Mother        breast  . Cancer Father        prostate  . Muscular dystrophy Son     Social History Social History  Substance Use Topics  . Smoking status: Never Smoker  . Smokeless tobacco: Never Used  . Alcohol use No     Allergies   Prednisone; Fenofibrate; Quinolones; Statins; and Septra [sulfamethoxazole-trimethoprim]   Review of Systems Review of Systems  Constitutional: Negative for chills and fever.  Respiratory: Negative for shortness of breath.   Cardiovascular: Negative for chest pain.  Gastrointestinal: Positive for abdominal pain, diarrhea and nausea. Negative for blood in stool, constipation and vomiting.  Genitourinary: Negative for dysuria and hematuria.  Allergic/Immunologic: Positive for immunocompromised state (hx of cancer).     Physical Exam Updated Vital Signs BP 104/68 (BP Location: Right Arm)   Pulse 84   Temp 98.9 F (37.2 C) (Oral)   Resp 16   SpO2 97%   Physical Exam  Constitutional: She appears well-developed and well-nourished. No distress.  HENT:  Head: Normocephalic and atraumatic.  Eyes: Conjunctivae are normal. Right eye exhibits no discharge. Left eye exhibits no discharge.  Neck: No JVD present. No tracheal deviation present.  Cardiovascular: Normal rate, regular rhythm and normal heart sounds.     Pulmonary/Chest: Effort normal. She has wheezes. She has no rales. She exhibits no tenderness.   Speaks in short phrases, some increased work of breathing noted with this. Patient states this is chronic and unchanged. Equal rise and fall of chest. Expiratory wheezes noted diffusely in the left anterior lung fields.  Abdominal: Soft. Bowel sounds are normal. She exhibits no distension. There is tenderness. There is no guarding.  Generalized mild discomfort to palpation of the middle abdomen, Murphy's absent, Rovsing's absent, no CVA tenderness. Active bowel sounds throughout  Musculoskeletal: She exhibits no edema.  Neurological: She is alert.  Skin: She is not diaphoretic. No erythema.  Psychiatric: She has a normal mood and affect. Her behavior is normal.  Nursing note and vitals reviewed.    ED Treatments / Results  Labs (all labs ordered are listed, but only abnormal results are displayed) Labs Reviewed  CBC WITH DIFFERENTIAL/PLATELET - Abnormal; Notable for the following:       Result Value   WBC 20.3 (*)    RBC 3.72 (*)    Hemoglobin 11.2 (*)    HCT 32.3 (*)    Neutro Abs 15.4 (*)    Monocytes Absolute 1.6 (*)    Eosinophils Absolute 1.6 (*)    All other components within normal limits  COMPREHENSIVE METABOLIC PANEL - Abnormal; Notable for the following:    CO2 20 (*)    Glucose, Bld 101 (*)    BUN 22 (*)    All other components within normal limits    EKG  EKG Interpretation None       Radiology Dg Chest 2 View  Result Date: 06/06/2017 CLINICAL DATA:  Possible aspiration following an episode of nausea and reflux and abdominal discomfort last night. Stage IV lung malignancy. Ongoing cough and chest congestion. EXAM: CHEST  2 VIEW COMPARISON:  CT scan of the chest of May 12, 2017 FINDINGS: The lungs are well-expanded. There is no acute focal infiltrate. There is chronically increased density in the posterior aspect of the left upper lobe. There is no pleural  effusion. The heart is normal in size. The pulmonary vascularity is not engorged. The power port catheter tip projects over the midportion of the SVC. There is mild multilevel degenerative disc disease of the thoracic spine. There is stable partial wedge compression of the body of T12. IMPRESSION: No definite evidence of acute aspiration pneumonia or atelectasis.  Chronic changes in the posterior aspect of the left upper lung likely reflecting postradiation change. No CHF. Electronically Signed   By: David  Martinique M.D.   On: 06/06/2017 10:22    Procedures Procedures (including critical care time)  Medications Ordered in ED Medications  doxycycline (VIBRA-TABS) tablet 100 mg (100 mg Oral Given 06/06/17 1234)  heparin lock flush 100 unit/mL (500 Units Intracatheter Given 06/06/17 1234)     Initial Impression / Assessment and Plan / ED Course  I have reviewed the triage vital signs and the nursing notes.  Pertinent labs & imaging results that were available during my care of the patient were reviewed by me and considered in my medical decision making (see chart for details).     Patient with stage IV adenocarcinoma of the lung presents with concern for aspiration pneumonia and reflux symptoms. Afebrile, vital signs are stable and SPO2 96% on room air. Chest x-ray shows no definite evidence of acute aspiration pneumonia or atelectasis but chronic changes that are likely reflective of postradiation changes. No evidence of CHF. She does have a leukocytosis of 20.3. Consulted her oncologist Dr. Earlie Server, who advised that her chemotherapy agent does not cause a leukocytosis. He recommends treatment for aspiration pneumonia with levaquin x10 days. Patient lists an allergy to   fluoroquinolone stating that within 1 day of taking them she develops a limp. She does state this resolves but is wary of taking them again as she is concern for tendon rupture. Will treat with doxycycline alternatively. First dose  given in the ED. She is stable for discharge home with reevaluation with her oncologist next week as scheduled. Discussed indications for return to the ED. Pt verbalized understanding of and agreement with plan and is safe for discharge home at this time. She has no complaints prior to discharge. Patient seen and evaluated by Dr. Roderic Palau.   Final Clinical Impressions(s) / ED Diagnoses   Final diagnoses:  SOB (shortness of breath)  Gastroesophageal reflux disease, esophagitis presence not specified    New Prescriptions Discharge Medication List as of 06/06/2017 12:22 PM    START taking these medications   Details  doxycycline (VIBRAMYCIN) 100 MG capsule Take 1 capsule (100 mg total) by mouth 2 (two) times daily., Starting Tue 06/06/2017, Until Fri 06/16/2017, Print         Nils Flack, Desha A, PA-C 06/06/17 1713    Milton Ferguson, MD 06/07/17 1531

## 2017-06-09 DIAGNOSIS — N3020 Other chronic cystitis without hematuria: Secondary | ICD-10-CM | POA: Diagnosis not present

## 2017-06-09 DIAGNOSIS — N3946 Mixed incontinence: Secondary | ICD-10-CM | POA: Diagnosis not present

## 2017-06-15 ENCOUNTER — Encounter: Payer: Self-pay | Admitting: Internal Medicine

## 2017-06-15 ENCOUNTER — Ambulatory Visit: Payer: BLUE CROSS/BLUE SHIELD

## 2017-06-15 ENCOUNTER — Ambulatory Visit (HOSPITAL_BASED_OUTPATIENT_CLINIC_OR_DEPARTMENT_OTHER): Payer: BLUE CROSS/BLUE SHIELD | Admitting: Internal Medicine

## 2017-06-15 ENCOUNTER — Ambulatory Visit (HOSPITAL_BASED_OUTPATIENT_CLINIC_OR_DEPARTMENT_OTHER): Payer: BLUE CROSS/BLUE SHIELD

## 2017-06-15 ENCOUNTER — Other Ambulatory Visit (HOSPITAL_BASED_OUTPATIENT_CLINIC_OR_DEPARTMENT_OTHER): Payer: BLUE CROSS/BLUE SHIELD

## 2017-06-15 VITALS — BP 117/56 | HR 84 | Temp 98.6°F | Resp 18 | Ht 64.5 in | Wt 142.9 lb

## 2017-06-15 DIAGNOSIS — C3412 Malignant neoplasm of upper lobe, left bronchus or lung: Secondary | ICD-10-CM

## 2017-06-15 DIAGNOSIS — C7970 Secondary malignant neoplasm of unspecified adrenal gland: Secondary | ICD-10-CM

## 2017-06-15 DIAGNOSIS — Z95828 Presence of other vascular implants and grafts: Secondary | ICD-10-CM

## 2017-06-15 DIAGNOSIS — Z79899 Other long term (current) drug therapy: Secondary | ICD-10-CM

## 2017-06-15 DIAGNOSIS — Z5112 Encounter for antineoplastic immunotherapy: Secondary | ICD-10-CM

## 2017-06-15 DIAGNOSIS — C7931 Secondary malignant neoplasm of brain: Secondary | ICD-10-CM | POA: Diagnosis not present

## 2017-06-15 DIAGNOSIS — C3492 Malignant neoplasm of unspecified part of left bronchus or lung: Secondary | ICD-10-CM

## 2017-06-15 DIAGNOSIS — C787 Secondary malignant neoplasm of liver and intrahepatic bile duct: Secondary | ICD-10-CM | POA: Diagnosis not present

## 2017-06-15 DIAGNOSIS — C7951 Secondary malignant neoplasm of bone: Secondary | ICD-10-CM

## 2017-06-15 LAB — CBC WITH DIFFERENTIAL/PLATELET
BASO%: 0.2 % (ref 0.0–2.0)
BASOS ABS: 0 10*3/uL (ref 0.0–0.1)
EOS%: 10.2 % — AB (ref 0.0–7.0)
Eosinophils Absolute: 1.7 10*3/uL — ABNORMAL HIGH (ref 0.0–0.5)
HCT: 33 % — ABNORMAL LOW (ref 34.8–46.6)
HEMOGLOBIN: 10.9 g/dL — AB (ref 11.6–15.9)
LYMPH%: 9.1 % — ABNORMAL LOW (ref 14.0–49.7)
MCH: 29.8 pg (ref 25.1–34.0)
MCHC: 33 g/dL (ref 31.5–36.0)
MCV: 90.2 fL (ref 79.5–101.0)
MONO#: 1.5 10*3/uL — ABNORMAL HIGH (ref 0.1–0.9)
MONO%: 8.8 % (ref 0.0–14.0)
NEUT%: 71.7 % (ref 38.4–76.8)
NEUTROS ABS: 11.9 10*3/uL — AB (ref 1.5–6.5)
Platelets: 284 10*3/uL (ref 145–400)
RBC: 3.66 10*6/uL — ABNORMAL LOW (ref 3.70–5.45)
RDW: 15.2 % — AB (ref 11.2–14.5)
WBC: 16.6 10*3/uL — AB (ref 3.9–10.3)
lymph#: 1.5 10*3/uL (ref 0.9–3.3)

## 2017-06-15 LAB — COMPREHENSIVE METABOLIC PANEL
ALBUMIN: 3.2 g/dL — AB (ref 3.5–5.0)
ALK PHOS: 116 U/L (ref 40–150)
ALT: 13 U/L (ref 0–55)
AST: 17 U/L (ref 5–34)
Anion Gap: 9 mEq/L (ref 3–11)
BUN: 23.4 mg/dL (ref 7.0–26.0)
CO2: 23 mEq/L (ref 22–29)
Calcium: 9.6 mg/dL (ref 8.4–10.4)
Chloride: 107 mEq/L (ref 98–109)
Creatinine: 0.9 mg/dL (ref 0.6–1.1)
EGFR: 71 mL/min/{1.73_m2} — ABNORMAL LOW (ref >90)
GLUCOSE: 107 mg/dL (ref 70–140)
POTASSIUM: 4 meq/L (ref 3.5–5.1)
SODIUM: 139 meq/L (ref 136–145)
Total Bilirubin: 0.26 mg/dL (ref 0.20–1.20)
Total Protein: 6.8 g/dL (ref 6.4–8.3)

## 2017-06-15 LAB — TSH: TSH: 0.761 m[IU]/L (ref 0.308–3.960)

## 2017-06-15 MED ORDER — SODIUM CHLORIDE 0.9% FLUSH
10.0000 mL | INTRAVENOUS | Status: DC | PRN
  Administered 2017-06-15: 10 mL via INTRAVENOUS
  Filled 2017-06-15: qty 10

## 2017-06-15 MED ORDER — SODIUM CHLORIDE 0.9% FLUSH
10.0000 mL | INTRAVENOUS | Status: DC | PRN
  Administered 2017-06-15: 10 mL
  Filled 2017-06-15: qty 10

## 2017-06-15 MED ORDER — SODIUM CHLORIDE 0.9 % IV SOLN
200.0000 mg | Freq: Once | INTRAVENOUS | Status: AC
  Administered 2017-06-15: 200 mg via INTRAVENOUS
  Filled 2017-06-15: qty 8

## 2017-06-15 MED ORDER — HEPARIN SOD (PORK) LOCK FLUSH 100 UNIT/ML IV SOLN
500.0000 [IU] | Freq: Once | INTRAVENOUS | Status: AC | PRN
  Administered 2017-06-15: 500 [IU]
  Filled 2017-06-15: qty 5

## 2017-06-15 MED ORDER — SODIUM CHLORIDE 0.9 % IV SOLN
Freq: Once | INTRAVENOUS | Status: AC
  Administered 2017-06-15: 12:00:00 via INTRAVENOUS

## 2017-06-15 NOTE — Patient Instructions (Signed)
Implanted Port Home Guide An implanted port is a type of central line that is placed under the skin. Central lines are used to provide IV access when treatment or nutrition needs to be given through a person's veins. Implanted ports are used for long-term IV access. An implanted port may be placed because:  You need IV medicine that would be irritating to the small veins in your hands or arms.  You need long-term IV medicines, such as antibiotics.  You need IV nutrition for a long period.  You need frequent blood draws for lab tests.  You need dialysis.  Implanted ports are usually placed in the chest area, but they can also be placed in the upper arm, the abdomen, or the leg. An implanted port has two main parts:  Reservoir. The reservoir is round and will appear as a small, raised area under your skin. The reservoir is the part where a needle is inserted to give medicines or draw blood.  Catheter. The catheter is a thin, flexible tube that extends from the reservoir. The catheter is placed into a large vein. Medicine that is inserted into the reservoir goes into the catheter and then into the vein.  How will I care for my incision site? Do not get the incision site wet. Bathe or shower as directed by your health care provider. How is my port accessed? Special steps must be taken to access the port:  Before the port is accessed, a numbing cream can be placed on the skin. This helps numb the skin over the port site.  Your health care provider uses a sterile technique to access the port. ? Your health care provider must put on a mask and sterile gloves. ? The skin over your port is cleaned carefully with an antiseptic and allowed to dry. ? The port is gently pinched between sterile gloves, and a needle is inserted into the port.  Only "non-coring" port needles should be used to access the port. Once the port is accessed, a blood return should be checked. This helps ensure that the port  is in the vein and is not clogged.  If your port needs to remain accessed for a constant infusion, a clear (transparent) bandage will be placed over the needle site. The bandage and needle will need to be changed every week, or as directed by your health care provider.  Keep the bandage covering the needle clean and dry. Do not get it wet. Follow your health care provider's instructions on how to take a shower or bath while the port is accessed.  If your port does not need to stay accessed, no bandage is needed over the port.  What is flushing? Flushing helps keep the port from getting clogged. Follow your health care provider's instructions on how and when to flush the port. Ports are usually flushed with saline solution or a medicine called heparin. The need for flushing will depend on how the port is used.  If the port is used for intermittent medicines or blood draws, the port will need to be flushed: ? After medicines have been given. ? After blood has been drawn. ? As part of routine maintenance.  If a constant infusion is running, the port may not need to be flushed.  How long will my port stay implanted? The port can stay in for as long as your health care provider thinks it is needed. When it is time for the port to come out, surgery will be   done to remove it. The procedure is similar to the one performed when the port was put in. When should I seek immediate medical care? When you have an implanted port, you should seek immediate medical care if:  You notice a bad smell coming from the incision site.  You have swelling, redness, or drainage at the incision site.  You have more swelling or pain at the port site or the surrounding area.  You have a fever that is not controlled with medicine.  This information is not intended to replace advice given to you by your health care provider. Make sure you discuss any questions you have with your health care provider. Document  Released: 10/31/2005 Document Revised: 04/07/2016 Document Reviewed: 07/08/2013 Elsevier Interactive Patient Education  2017 [employer]  

## 2017-06-15 NOTE — Patient Instructions (Signed)
[facility] Discharge Instructions for Patients Receiving Chemotherapy  Today you received the following chemotherapy agents: Keytruda   To help prevent nausea and vomiting after your treatment, we encourage you to take your nausea medication as directed    If you develop nausea and vomiting that is not controlled by your nausea medication, call the clinic.   BELOW ARE SYMPTOMS THAT SHOULD BE REPORTED IMMEDIATELY:  *FEVER GREATER THAN 100.5 F  *CHILLS WITH OR WITHOUT FEVER  NAUSEA AND VOMITING THAT IS NOT CONTROLLED WITH YOUR NAUSEA MEDICATION  *UNUSUAL SHORTNESS OF BREATH  *UNUSUAL BRUISING OR BLEEDING  TENDERNESS IN MOUTH AND THROAT WITH OR WITHOUT PRESENCE OF ULCERS  *URINARY PROBLEMS  *BOWEL PROBLEMS  UNUSUAL RASH Items with * indicate a potential emergency and should be followed up as soon as possible.  Feel free to call the clinic you have any questions or concerns. The clinic phone number is [phone].  Please show the CHEMO ALERT CARD at check-in to the Emergency Department and triage nurse.   

## 2017-06-15 NOTE — Progress Notes (Signed)
[facility] [phone]   [phone]  OFFICE PROGRESS NOTE  [name], MD [address] [place] [address]  DIAGNOSIS: Stage IVb (T1b, N2, M1c) non-small cell lung cancer, adenocarcinoma in a never smoker patient diagnosed in January 2018 and presented with left upper lobe lung mass, mediastinal lymphadenopathy as well as metastatic disease to the bone, liver, adrenal glands as well as abdominal wall nodules and brain metastasis. PDL 1 expression 70%.  Genomic Alterations Identified? KRAS G12V NOTCH2 rearrangement exon 27 JJ00 splice site [id number] Additional Findings? Microsatellite status MS-Stable Tumor Mutation Burden TMB-Intermediate; 8 Muts/Mb Additional Disease-relevant Genes with No Reportable Alterations Identified? EGFR ALK BRAF MET RET ERBB2 ROS1  PRIOR THERAPY:  1) Palliative radiotherapy to the chest and sacrum under the care of Dr. Tammi Klippel. 2)  Systemic chemotherapy with carboplatin for AUC of 5, Alimta 500 MG/M2 and Avastin 15 MG/KG every 3 weeks. Status post 3 cycles. First dose was given 12/22/2016. Last cycle was given on 02/02/2017, discontinued secondary to disease progression.  CURRENT THERAPY: Treatment with immunotherapy with Ketruda (pembrolizumab) 200 MG IV every 3 weeks. First dose 03/02/2017. Status post 5 cycles.  INTERVAL HISTORY: Karen Vasquez 66 y.o. female returns to the clinic today for follow-up visit accompanied by her husband. The patient is feeling fine today with no specific complaints. She was seen at the measures department for reflux disease with questionable aspiration. She was started on treatment with doxycycline and feeling much better. She denied having any chest pain, shortness of breath, cough or hemoptysis. She has no nausea, vomiting, diarrhea or constipation. She denied having any weight loss or night sweats. She has no fever or chills. She is here today for evaluation  before starting cycle #6.   MEDICAL HISTORY: Past Medical History:  Diagnosis Date  . Adenocarcinoma of left lung, stage 4 ([facility]) 12/16/2016  . Arthritis   . Asthma due to seasonal allergies   . Chronic fatigue 02/23/2017  . Encounter for antineoplastic chemotherapy 12/17/2016  . GERD (gastroesophageal reflux disease)   . Goals of care, counseling/discussion 12/17/2016  . Hernia of abdominal wall    umbilical  . Hypertension 01/09/2017  . No pertinent past medical history    COLD UPPER RESP STARTED ZPAK 6/14  . Recurrent upper respiratory infection (URI)    recent sinus inf  . Sinus infection 02/25/2017  . Thyroid nodule   . Urine blood    hematuria    ALLERGIES:  is allergic to prednisone; fenofibrate; quinolones; statins; and septra [sulfamethoxazole-trimethoprim].  MEDICATIONS:  Current Outpatient Prescriptions  Medication Sig Dispense Refill  . aspirin EC 81 MG tablet Take 81 mg by mouth daily.    . calcium gluconate 500 MG tablet Take 1 tablet by mouth 2 (two) times daily.    . Cholecalciferol (VITAMIN D) 2000 units tablet Take 2,000 Units by mouth daily.    Marland Kitchen conjugated estrogens (PREMARIN) vaginal cream Place 1 Applicatorful vaginally daily.    . Cranberry 500 MG TABS Take 1 tablet by mouth daily.     Marland Kitchen doxycycline (VIBRAMYCIN) 100 MG capsule Take 1 capsule (100 mg total) by mouth 2 (two) times daily. 20 capsule 0  . ezetimibe (ZETIA) 10 MG tablet Take 10 mg by mouth daily.    Marland Kitchen ketotifen (ZADITOR) 0.025 % ophthalmic solution Place 1 drop into both eyes 2 (two) times daily.    Marland Kitchen lidocaine-prilocaine (EMLA) cream Apply 1 application topically as needed. Apply 1-2 tsp  1-2 hours prior to labs or chemotherapy 30 g 0  . loratadine (CLARITIN) 10 MG tablet Take 10 mg by mouth daily.    Marland Kitchen LORazepam (ATIVAN) 0.5 MG tablet 1 tablet po 30 minutes prior to radiation or MRI 30 tablet 0  . pantoprazole (PROTONIX) 40 MG tablet Take 40 mg by mouth 2 (two) times daily.     . raloxifene  (EVISTA) 60 MG tablet Take 60 mg by mouth daily.    . sodium chloride (OCEAN) 0.65 % nasal spray Place 1 spray into the nose daily as needed for congestion.     . temazepam (RESTORIL) 30 MG capsule Take 1 capsule (30 mg total) by mouth at bedtime as needed for sleep. 30 capsule 0  . trimethoprim (TRIMPEX) 100 MG tablet Take 100 mg by mouth daily.     Marland Kitchen VASCEPA 1 g CAPS Take 1 tablet by mouth 2 (two) times daily.    . vitamin C (ASCORBIC ACID) 250 MG tablet Take 250 mg by mouth daily.    Marland Kitchen LORazepam (ATIVAN) 0.5 MG tablet Take 1 tablet (0.5 mg total) by mouth every 8 (eight) hours. (Patient not taking: Reported on 06/15/2017) 30 tablet 0  . Multiple Vitamin (MULTIVITAMIN) tablet Take 1 tablet by mouth daily.    . ondansetron (ZOFRAN) 8 MG tablet Take 1 tablet (8 mg total) by mouth every 8 (eight) hours as needed for nausea or vomiting. (Patient not taking: Reported on 06/15/2017) 30 tablet 0  . oxyCODONE (OXY IR/ROXICODONE) 5 MG immediate release tablet Take 1-3 tablets (5-15 mg total) by mouth every 4 (four) hours as needed for severe pain. (Patient not taking: Reported on 06/15/2017) 100 tablet 0  . prochlorperazine (COMPAZINE) 10 MG tablet TAKE ONE TABLET BY MOUTH EVERY 6 HOURS AS NEEDED FOR NAUSEA/VOMITING (Patient not taking: Reported on 06/15/2017) 30 tablet 0   No current facility-administered medications for this visit.     SURGICAL HISTORY:  Past Surgical History:  Procedure Laterality Date  . ABDOMINAL HYSTERECTOMY  08   vag, rectocele repair , bladder sling  . ANTERIOR CERVICAL DECOMP/DISCECTOMY FUSION  05/09/2012   Procedure: ANTERIOR CERVICAL DECOMPRESSION/DISCECTOMY FUSION 1 LEVEL;  Surgeon: Eustace Moore, MD;  Location: [facility];  Service: Neurosurgery;  Laterality: N/A;  anterior cervical decompression fusion cervical five-six  . BREAST CYST EXCISION  96   fibroadenoma lft  . BREAST SURGERY     03  . CARPAL TUNNEL RELEASE  05   bil  . DILATION AND CURETTAGE OF UTERUS  03     polyps  . EYE SURGERY     cataracts bilat; left eye surgery   . IR FLUORO GUIDE PORT INSERTION RIGHT  05/09/2017  . IR US GUIDE VASC ACCESS RIGHT  05/09/2017  . TUBAL LIGATION  88    REVIEW OF SYSTEMS:  A comprehensive review of systems was negative except for: Constitutional: positive for fatigue   PHYSICAL EXAMINATION: General appearance: alert, cooperative and no distress Head: Normocephalic, without obvious abnormality, atraumatic Neck: no adenopathy, no JVD, supple, symmetrical, trachea midline and thyroid not enlarged, symmetric, no tenderness/mass/nodules Lymph nodes: Cervical, supraclavicular, and axillary nodes normal. Resp: clear to auscultation bilaterally Back: symmetric, no curvature. ROM normal. No CVA tenderness. Cardio: regular rate and rhythm, S1, S2 normal, no murmur, click, rub or gallop GI: soft, non-tender; bowel sounds normal; no masses,  no organomegaly Extremities: extremities normal, atraumatic, no cyanosis or edema  ECOG PERFORMANCE STATUS: 1 - Symptomatic but completely ambulatory  Blood pressure Marland Kitchen)  117/56, pulse 84, temperature 98.6 F (37 C), temperature source Oral, resp. rate 18, height 5' 4.5" (1.638 m), weight 142 lb 14.4 oz (64.8 kg), SpO2 100 %.  LABORATORY DATA: Lab Results  Component Value Date   WBC 16.6 (H) 06/15/2017   HGB 10.9 (L) 06/15/2017   HCT 33.0 (L) 06/15/2017   MCV 90.2 06/15/2017   PLT 284 06/15/2017      Chemistry      Component Value Date/Time   NA 139 06/15/2017 1022   K 4.0 06/15/2017 1022   CL 105 06/06/2017 1120   CO2 23 06/15/2017 1022   BUN 23.4 06/15/2017 1022   CREATININE 0.9 06/15/2017 1022      Component Value Date/Time   CALCIUM 9.6 06/15/2017 1022   ALKPHOS 116 06/15/2017 1022   AST 17 06/15/2017 1022   ALT 13 06/15/2017 1022   BILITOT 0.26 06/15/2017 1022       RADIOGRAPHIC STUDIES: Dg Chest 2 View  Result Date: 06/06/2017 CLINICAL DATA:  Possible aspiration following an episode of nausea and  reflux and abdominal discomfort last night. Stage IV lung malignancy. Ongoing cough and chest congestion. EXAM: CHEST  2 VIEW COMPARISON:  CT scan of the chest of May 12, 2017 FINDINGS: The lungs are well-expanded. There is no acute focal infiltrate. There is chronically increased density in the posterior aspect of the left upper lobe. There is no pleural effusion. The heart is normal in size. The pulmonary vascularity is not engorged. The power port catheter tip projects over the midportion of the SVC. There is mild multilevel degenerative disc disease of the thoracic spine. There is stable partial wedge compression of the body of T12. IMPRESSION: No definite evidence of acute aspiration pneumonia or atelectasis. Chronic changes in the posterior aspect of the left upper lung likely reflecting postradiation change. No CHF. Electronically Signed   By: David  Martinique M.D.   On: 06/06/2017 10:22    ASSESSMENT AND PLAN:  This is a very pleasant 66 years old white female with a stage IV non-small cell lung cancer was no actual mutations anteriorly 1 suppression of 70%. She was initially started on systemic chemotherapy with carboplatin, Alimta and Avastin for 3 cycles but this was discontinued secondary to disease progression. She is currently on treatment with immunotherapy with [place] status post 5 cycles and she tolerated the last cycle of her treatment well. I recommended for her to proceed with cycle #6 today as scheduled. I will see the patient back for follow-up visit in 3 weeks for evaluation after repeating CT scan of the chest, abdomen and pelvis for restaging of her disease. The patient was advised to call immediately if she has any concerning symptoms in the interval. The patient voices understanding of current disease status and treatment options and is in agreement with the current care plan. All questions were answered. The patient knows to call the clinic with any problems, questions or  concerns. We can certainly see the patient much sooner if necessary.  Disclaimer: This note was dictated with voice recognition software. Similar sounding words can inadvertently be transcribed and may not be corrected upon review.

## 2017-06-16 ENCOUNTER — Telehealth: Payer: Self-pay | Admitting: Internal Medicine

## 2017-06-16 NOTE — Telephone Encounter (Signed)
Spoke with patient regarding their upcoming appointments in August, September and October.

## 2017-06-22 ENCOUNTER — Other Ambulatory Visit: Payer: Self-pay | Admitting: Radiation Therapy

## 2017-06-22 DIAGNOSIS — C7931 Secondary malignant neoplasm of brain: Secondary | ICD-10-CM

## 2017-06-28 IMAGING — MR MR HEAD WO/W CM
11 series · 47 of 48 positions shown · IV contrast (multihance)
Comparison: Brain MRI without and with contrast 12/20/2016.

CLINICAL DATA: 65-year-old female with widely metastatic lung
cancer. Are stable and within normal limits. Study for stereotactic
surgical planning. Subsequent encounter.

EXAM:
MRI HEAD WITHOUT AND WITH CONTRAST
TECHNIQUE: Multiplanar, multiecho pulse sequences of the brain and surrounding
structures were obtained without and with intravenous contrast.
CONTRAST:  13mL MULTIHANCE GADOBENATE DIMEGLUMINE 529 MG/ML IV SOLN

[Series 2: FLAIR · sagittal · 3.0mm · 0.75mm/px · 2 of 39 slices shown (1 of 2)]
[im 1/39]
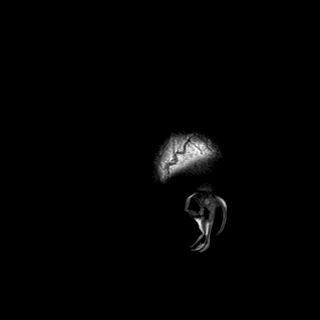
[im 39/39]
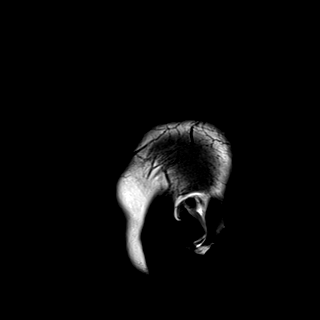

[Series 3: DWI · axial · 3.0mm · 1.50mm/px · z∈[-55,+92]mm · 5 of 78 slices shown (1 of 2)]
[im 1/78]
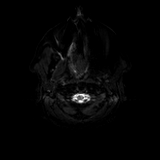
[im 20/78]
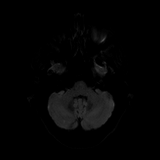
[im 39/78]
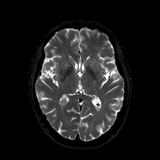
[im 58/78]
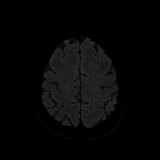
[im 78/78]
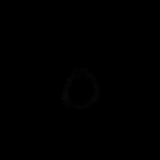

[Series 4: DWI · axial · 3.0mm · 1.50mm/px · z∈[-55,+92]mm · 3 of 39 slices shown (2 of 2)]
[im 1/39]
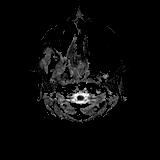
[im 20/39]
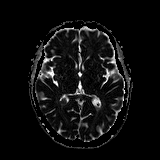
[im 39/39]
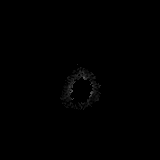

[Series 5: T2 · axial · 5.0mm · 0.57mm/px · z∈[-60,+94]mm · 2 of 27 slices shown]
[im 1/27]
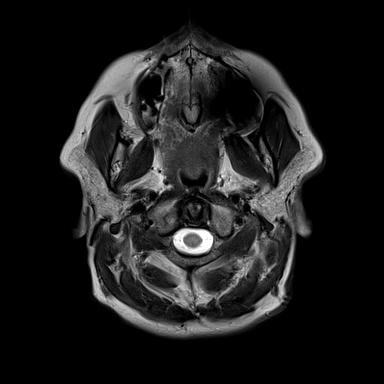
[im 27/27]
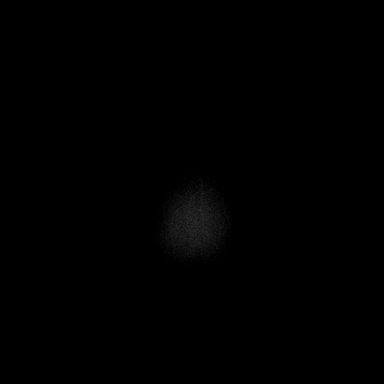

[Series 6: GRE · axial · 5.0mm · 0.57mm/px · z∈[-60,+94]mm · 2 of 27 slices shown]
[im 1/27]
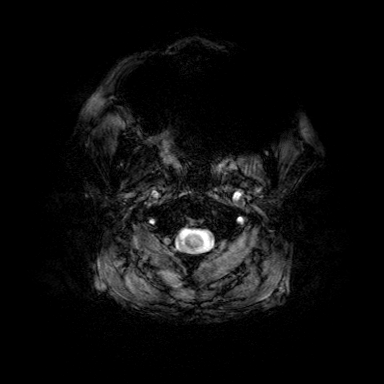
[im 27/27]
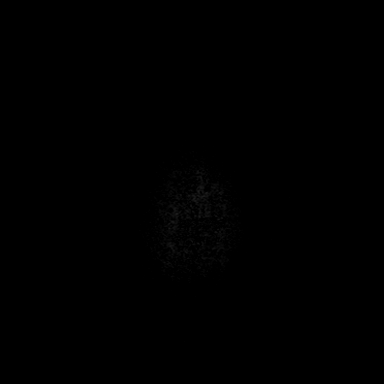

[Series 7: FLAIR · axial · 3.0mm · 0.57mm/px · z∈[-86,+67]mm · 3 of 52 slices shown (2 of 2)]
[im 1/52]
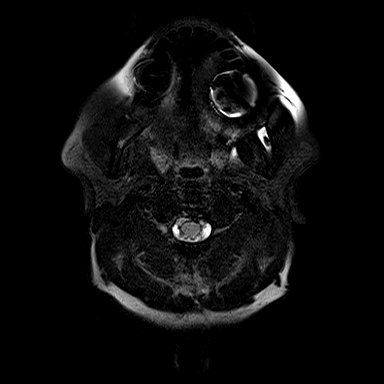
[im 26/52]
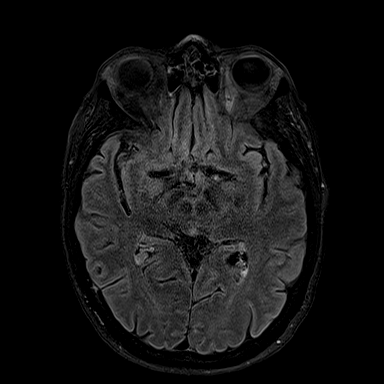
[im 52/52]
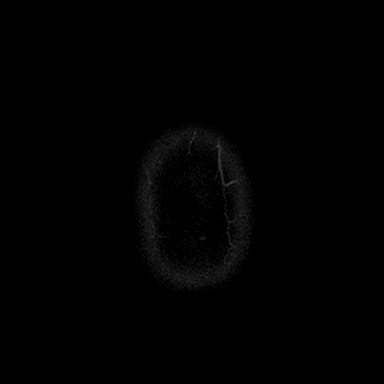

[Series 8: T1 · axial · 1.0mm · 0.75mm/px · z∈[-89,+70]mm · 10 of 160 slices shown]
[im 1/160]
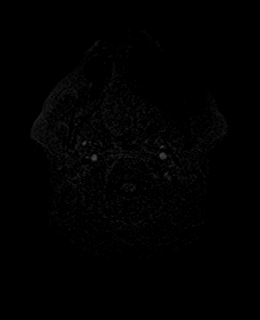
[im 16/160]
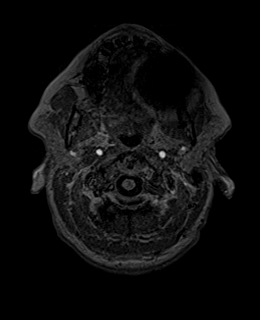
[im 32/160]
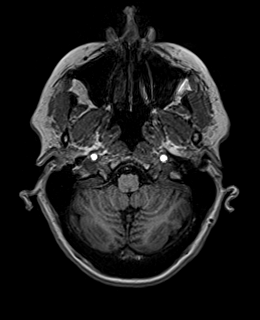
[im 48/160]
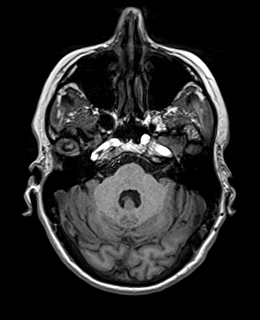
[im 64/160]
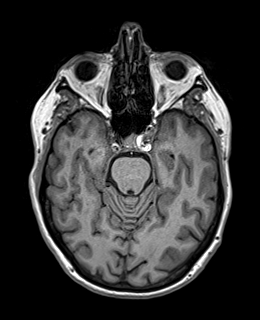
[im 80/160]
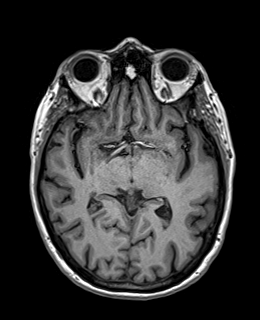
[im 96/160]
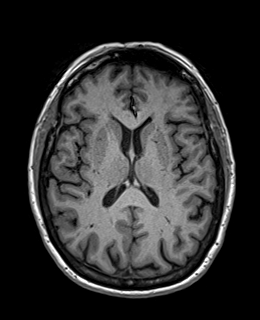
[im 112/160]
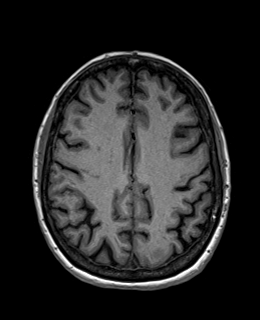
[im 128/160]
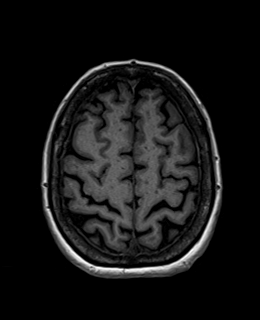
[im 160/160]
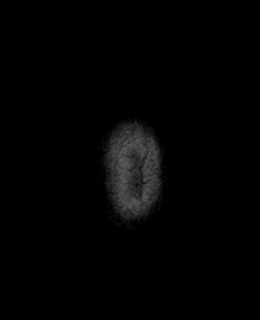

[Series 9: T2 post-contrast · coronal · 3.0mm · 0.57mm/px · 3 of 47 slices shown]
[im 1/47]
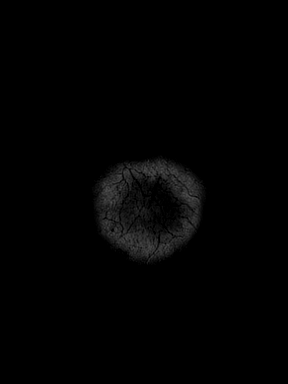
[im 24/47]
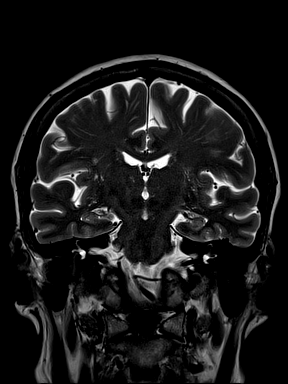
[im 47/47]
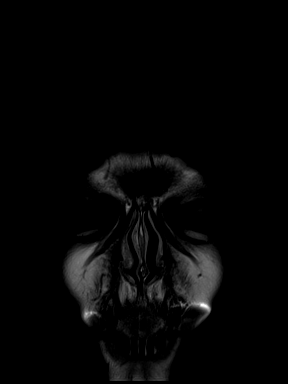

[Series 10: T1 post-contrast · axial · 1.0mm · 0.75mm/px · z∈[-89,+70]mm · 11 of 160 slices shown (1 of 2)]
[im 1/160]
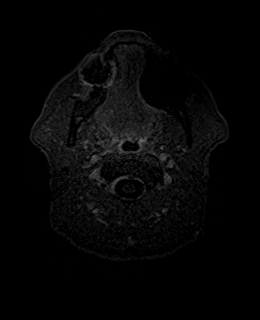
[im 16/160]
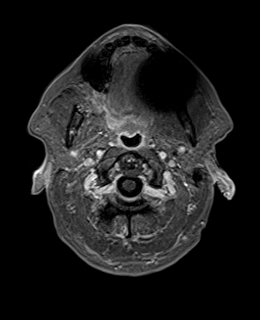
[im 32/160]
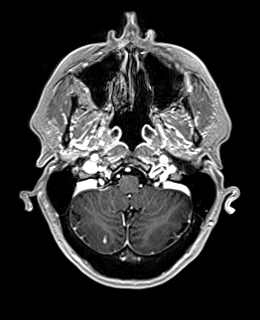
[im 48/160]
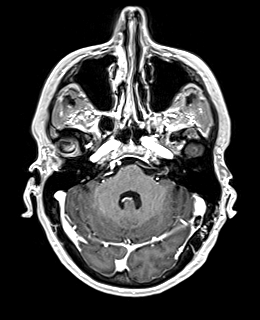
[im 64/160]
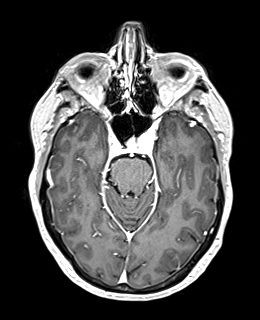
[im 80/160]
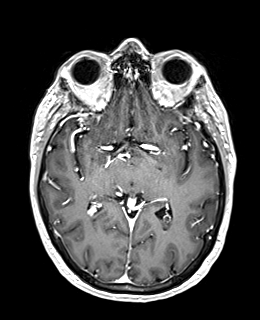
[im 96/160]
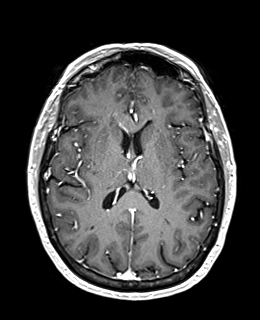
[im 112/160]
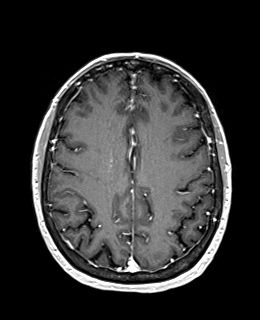
[im 128/160]
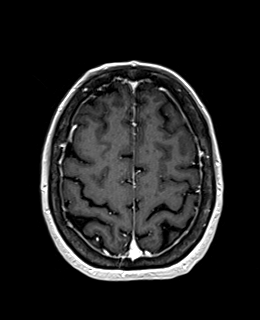
[im 144/160]
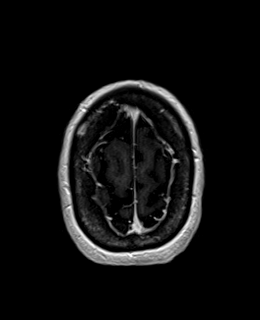
[im 160/160]
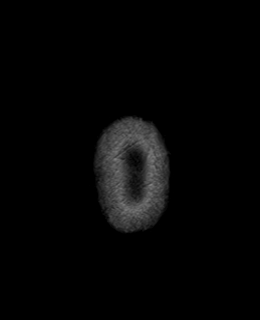

[Series 12: FLAIR post-contrast · sagittal · 3.0mm · 0.75mm/px · 3 of 39 slices shown]
[im 1/39]
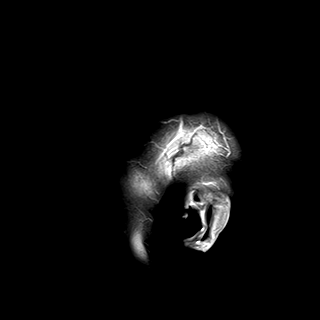
[im 20/39]
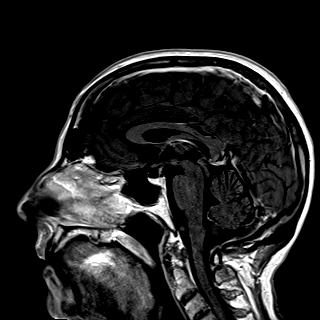
[im 39/39]
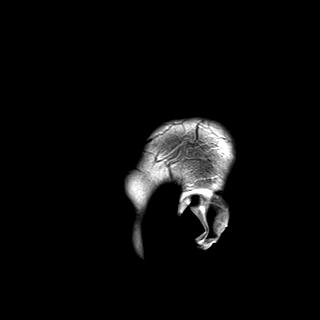

[Series 13: T1 post-contrast · coronal · 3.0mm · 0.57mm/px · 3 of 47 slices shown (2 of 2)]
[im 1/47]
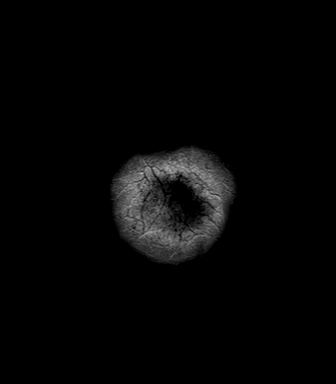
[im 24/47]
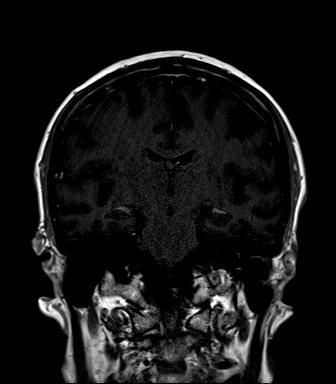
[im 47/47]
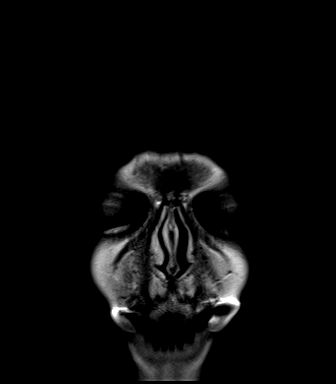

[47 of 48 positions shown; findings below may reference images not displayed]

FINDINGS: Brain:

The small rim enhancing posterior left frontal lobe mass seen today
on series 10, image 108 demonstrates less intense enhancement now
all, but overall stable size at 5-6 mm. The surrounding edema has
mildly regressed (series 7, image 35). As before this lesion shows
restricted diffusion.

There is a new 2-3 mm metastasis in the right parietal lobe on
series 10, image 119 with no edema.

The left superior frontal convexity round and dural based appearing
mass measuring 9 x 13 mm is stable on series 10, image 134. No
associated edema.

The right choroid plexus based mass appears slightly smaller (series
13, images 16 and 17 today versus series 13 images 10 and 11
previously). No the contralateral left choroid plexus appears
stable. No associated subependymal enhancement elsewhere. No
layering intraventricular debris.

There is a new curvilinear focus of enhancement in the right central
cerebellum measuring about 12 mm in length. There was no evidence of
this lesion on the prior study, but there is no definite diffusion
abnormality associated, but there is associated linear T2 and FLAIR
hyperintensity without a larger area of edema or mass effect.

There are also several small foci of restricted diffusion in the
posterior left MCA territory (left parietal lobe series 3, images 86
66 through 69) without associated enhancement. No other restricted
diffusion.

Elsewhere stable gray and white matter signal including multiple
scattered central and subcortical white matter foci of nonspecific
T2 and FLAIR hyperintensity which are probably small vessel disease
related. No chronic cerebral blood products identified. No
ventriculomegaly. Patent basilar cisterns. Negative pituitary and
cervicomedullary junction.

Vascular: Major intracranial vascular flow voids

Skull and upper cervical spine: No definite calvarium bone
metastasis, there is a right frontal bone benign hemangioma (series
8, image 141).

Bone marrow signal at the skullbase and in the visible cervical
spine appears normal. Negative visualized cervical spinal cord.

Sinuses/Orbits: Stable and negative.

Other: Visible internal auditory structures appear normal. Negative
scalp soft tissues.
IMPRESSION: 1. Two definite brain parenchymal and one very likely choroid plexus
metastases.
Both the left frontal lobe and and right choroid plexus metastases
seen previously appear slightly smaller. Cerebral edema associated
with the former has mildly regressed.
There is a new 2-3 mm right parietal lobe metastasis, comprising the
second certain brain parenchymal met (series 10, image 119).
2. Stable left superior frontal convexity dural based metastasis
versus meningioma. No edema.
3. New 12 mm curvilinear enhancement in the posterior right
cerebellum is indeterminate but most resembles a subacute lacunar
infarct (series 10, image 37 and series 7, image 11). See also # 4.
4. Several punctate foci of restricted diffusion in the posterior
left MCA territory without enhancement are presumably tiny lacunar
infarcts.

## 2017-07-03 DIAGNOSIS — R739 Hyperglycemia, unspecified: Secondary | ICD-10-CM | POA: Diagnosis not present

## 2017-07-03 DIAGNOSIS — E7800 Pure hypercholesterolemia, unspecified: Secondary | ICD-10-CM | POA: Diagnosis not present

## 2017-07-04 ENCOUNTER — Encounter (HOSPITAL_COMMUNITY): Payer: Self-pay

## 2017-07-04 ENCOUNTER — Ambulatory Visit (HOSPITAL_COMMUNITY)
Admission: RE | Admit: 2017-07-04 | Discharge: 2017-07-04 | Disposition: A | Discharge status: Home / Self Care | Payer: BLUE CROSS/BLUE SHIELD | Source: Ambulatory Visit | Attending: Internal Medicine | Admitting: Internal Medicine

## 2017-07-04 DIAGNOSIS — C787 Secondary malignant neoplasm of liver and intrahepatic bile duct: Secondary | ICD-10-CM | POA: Insufficient documentation

## 2017-07-04 DIAGNOSIS — C3492 Malignant neoplasm of unspecified part of left bronchus or lung: Secondary | ICD-10-CM | POA: Insufficient documentation

## 2017-07-04 DIAGNOSIS — C7971 Secondary malignant neoplasm of right adrenal gland: Secondary | ICD-10-CM | POA: Insufficient documentation

## 2017-07-04 DIAGNOSIS — R590 Localized enlarged lymph nodes: Secondary | ICD-10-CM | POA: Diagnosis not present

## 2017-07-04 DIAGNOSIS — Z923 Personal history of irradiation: Secondary | ICD-10-CM | POA: Insufficient documentation

## 2017-07-04 DIAGNOSIS — N8189 Other female genital prolapse: Secondary | ICD-10-CM | POA: Diagnosis not present

## 2017-07-04 DIAGNOSIS — C7951 Secondary malignant neoplasm of bone: Secondary | ICD-10-CM | POA: Diagnosis not present

## 2017-07-04 DIAGNOSIS — Z5112 Encounter for antineoplastic immunotherapy: Secondary | ICD-10-CM

## 2017-07-04 DIAGNOSIS — C3490 Malignant neoplasm of unspecified part of unspecified bronchus or lung: Secondary | ICD-10-CM | POA: Diagnosis not present

## 2017-07-04 DIAGNOSIS — M4854XA Collapsed vertebra, not elsewhere classified, thoracic region, initial encounter for fracture: Secondary | ICD-10-CM | POA: Insufficient documentation

## 2017-07-04 DIAGNOSIS — C7970 Secondary malignant neoplasm of unspecified adrenal gland: Secondary | ICD-10-CM | POA: Diagnosis not present

## 2017-07-04 DIAGNOSIS — C7972 Secondary malignant neoplasm of left adrenal gland: Secondary | ICD-10-CM | POA: Insufficient documentation

## 2017-07-04 DIAGNOSIS — C7931 Secondary malignant neoplasm of brain: Secondary | ICD-10-CM | POA: Insufficient documentation

## 2017-07-04 MED ORDER — IOPAMIDOL (ISOVUE-300) INJECTION 61%
30.0000 mL | Freq: Once | INTRAVENOUS | Status: AC | PRN
  Administered 2017-07-04: 30 mL via ORAL

## 2017-07-04 MED ORDER — HEPARIN SOD (PORK) LOCK FLUSH 100 UNIT/ML IV SOLN
500.0000 [IU] | Freq: Once | INTRAVENOUS | Status: AC
  Administered 2017-07-04: 500 [IU] via INTRAVENOUS

## 2017-07-04 MED ORDER — HEPARIN SOD (PORK) LOCK FLUSH 100 UNIT/ML IV SOLN
INTRAVENOUS | Status: AC
  Filled 2017-07-04: qty 5

## 2017-07-04 MED ORDER — IOPAMIDOL (ISOVUE-300) INJECTION 61%
INTRAVENOUS | Status: AC
  Administered 2017-07-04: 100 mL via INTRAVENOUS
  Filled 2017-07-04: qty 100

## 2017-07-04 MED ORDER — IOPAMIDOL (ISOVUE-300) INJECTION 61%
INTRAVENOUS | Status: AC
  Administered 2017-07-04: 30 mL via ORAL
  Filled 2017-07-04: qty 30

## 2017-07-06 ENCOUNTER — Telehealth: Payer: Self-pay | Admitting: Internal Medicine

## 2017-07-06 ENCOUNTER — Ambulatory Visit (HOSPITAL_BASED_OUTPATIENT_CLINIC_OR_DEPARTMENT_OTHER): Payer: BLUE CROSS/BLUE SHIELD

## 2017-07-06 ENCOUNTER — Encounter: Payer: Self-pay | Admitting: Internal Medicine

## 2017-07-06 ENCOUNTER — Other Ambulatory Visit (HOSPITAL_BASED_OUTPATIENT_CLINIC_OR_DEPARTMENT_OTHER): Payer: BLUE CROSS/BLUE SHIELD

## 2017-07-06 ENCOUNTER — Ambulatory Visit (HOSPITAL_BASED_OUTPATIENT_CLINIC_OR_DEPARTMENT_OTHER): Payer: BLUE CROSS/BLUE SHIELD | Admitting: Internal Medicine

## 2017-07-06 ENCOUNTER — Ambulatory Visit: Payer: BLUE CROSS/BLUE SHIELD

## 2017-07-06 DIAGNOSIS — C3492 Malignant neoplasm of unspecified part of left bronchus or lung: Secondary | ICD-10-CM

## 2017-07-06 DIAGNOSIS — C7951 Secondary malignant neoplasm of bone: Secondary | ICD-10-CM | POA: Diagnosis not present

## 2017-07-06 DIAGNOSIS — C3412 Malignant neoplasm of upper lobe, left bronchus or lung: Secondary | ICD-10-CM | POA: Diagnosis not present

## 2017-07-06 DIAGNOSIS — C7931 Secondary malignant neoplasm of brain: Secondary | ICD-10-CM

## 2017-07-06 DIAGNOSIS — G4700 Insomnia, unspecified: Secondary | ICD-10-CM

## 2017-07-06 DIAGNOSIS — Z95828 Presence of other vascular implants and grafts: Secondary | ICD-10-CM | POA: Insufficient documentation

## 2017-07-06 DIAGNOSIS — C7970 Secondary malignant neoplasm of unspecified adrenal gland: Secondary | ICD-10-CM

## 2017-07-06 DIAGNOSIS — Z5112 Encounter for antineoplastic immunotherapy: Secondary | ICD-10-CM

## 2017-07-06 DIAGNOSIS — Z5111 Encounter for antineoplastic chemotherapy: Secondary | ICD-10-CM

## 2017-07-06 DIAGNOSIS — C787 Secondary malignant neoplasm of liver and intrahepatic bile duct: Secondary | ICD-10-CM

## 2017-07-06 DIAGNOSIS — G893 Neoplasm related pain (acute) (chronic): Secondary | ICD-10-CM | POA: Diagnosis not present

## 2017-07-06 DIAGNOSIS — Z79899 Other long term (current) drug therapy: Secondary | ICD-10-CM

## 2017-07-06 LAB — COMPREHENSIVE METABOLIC PANEL
ALBUMIN: 3.2 g/dL — AB (ref 3.5–5.0)
ALK PHOS: 106 U/L (ref 40–150)
ALT: 17 U/L (ref 0–55)
AST: 22 U/L (ref 5–34)
Anion Gap: 10 mEq/L (ref 3–11)
BILIRUBIN TOTAL: 0.34 mg/dL (ref 0.20–1.20)
BUN: 18.1 mg/dL (ref 7.0–26.0)
CO2: 24 meq/L (ref 22–29)
Calcium: 9.8 mg/dL (ref 8.4–10.4)
Chloride: 105 mEq/L (ref 98–109)
Creatinine: 0.9 mg/dL (ref 0.6–1.1)
EGFR: 65 mL/min/{1.73_m2} — ABNORMAL LOW (ref >90)
GLUCOSE: 94 mg/dL (ref 70–140)
Potassium: 4.3 mEq/L (ref 3.5–5.1)
SODIUM: 139 meq/L (ref 136–145)
TOTAL PROTEIN: 7.4 g/dL (ref 6.4–8.3)

## 2017-07-06 LAB — CBC WITH DIFFERENTIAL/PLATELET
BASO%: 0.2 % (ref 0.0–2.0)
Basophils Absolute: 0 10*3/uL (ref 0.0–0.1)
EOS%: 10.4 % — AB (ref 0.0–7.0)
Eosinophils Absolute: 1.9 10*3/uL — ABNORMAL HIGH (ref 0.0–0.5)
HCT: 30.5 % — ABNORMAL LOW (ref 34.8–46.6)
HEMOGLOBIN: 10 g/dL — AB (ref 11.6–15.9)
LYMPH%: 9.1 % — AB (ref 14.0–49.7)
MCH: 29.2 pg (ref 25.1–34.0)
MCHC: 32.8 g/dL (ref 31.5–36.0)
MCV: 88.9 fL (ref 79.5–101.0)
MONO#: 1.5 10*3/uL — ABNORMAL HIGH (ref 0.1–0.9)
MONO%: 8.5 % (ref 0.0–14.0)
NEUT%: 71.8 % (ref 38.4–76.8)
NEUTROS ABS: 12.9 10*3/uL — AB (ref 1.5–6.5)
Platelets: 304 10*3/uL (ref 145–400)
RBC: 3.43 10*6/uL — AB (ref 3.70–5.45)
RDW: 16.3 % — ABNORMAL HIGH (ref 11.2–14.5)
WBC: 17.9 10*3/uL — AB (ref 3.9–10.3)
lymph#: 1.6 10*3/uL (ref 0.9–3.3)

## 2017-07-06 LAB — TSH: TSH: 1.063 m(IU)/L (ref 0.308–3.960)

## 2017-07-06 MED ORDER — SODIUM CHLORIDE 0.9% FLUSH
10.0000 mL | Freq: Once | INTRAVENOUS | Status: AC
  Administered 2017-07-06: 10 mL
  Filled 2017-07-06: qty 10

## 2017-07-06 MED ORDER — PEMBROLIZUMAB CHEMO INJECTION 100 MG/4ML
200.0000 mg | Freq: Once | INTRAVENOUS | Status: AC
  Administered 2017-07-06: 200 mg via INTRAVENOUS
  Filled 2017-07-06: qty 8

## 2017-07-06 MED ORDER — TEMAZEPAM 30 MG PO CAPS: 30.0000 mg | ORAL_CAPSULE | Freq: Every evening | ORAL | 0 refills | Status: AC | PRN

## 2017-07-06 MED ORDER — OXYCODONE HCL 5 MG PO TABS: 5.0000 mg | ORAL_TABLET | ORAL | 0 refills | Status: AC | PRN

## 2017-07-06 MED ORDER — SODIUM CHLORIDE 0.9 % IV SOLN
Freq: Once | INTRAVENOUS | Status: AC
  Administered 2017-07-06: 11:00:00 via INTRAVENOUS

## 2017-07-06 MED ORDER — HEPARIN SOD (PORK) LOCK FLUSH 100 UNIT/ML IV SOLN
500.0000 [IU] | Freq: Once | INTRAVENOUS | Status: AC | PRN
  Administered 2017-07-06: 500 [IU]
  Filled 2017-07-06: qty 5

## 2017-07-06 MED ORDER — SODIUM CHLORIDE 0.9% FLUSH
10.0000 mL | INTRAVENOUS | Status: DC | PRN
  Administered 2017-07-06: 10 mL
  Filled 2017-07-06: qty 10

## 2017-07-06 NOTE — Progress Notes (Signed)
[place] Telephone:([phone]) [phone]   Fax:([phone]) [phone]  OFFICE PROGRESS NOTE  [name], MD [address] [place] [address]  DIAGNOSIS: Stage IVb (T1b, N2, M1c) non-small cell lung cancer, adenocarcinoma in a never smoker patient diagnosed in January 2018 and presented with left upper lobe lung mass, mediastinal lymphadenopathy as well as metastatic disease to the bone, liver, adrenal glands as well as abdominal wall nodules and brain metastasis. PDL 1 expression 70%.  Genomic Alterations Identified? KRAS G12V NOTCH2 rearrangement exon 27 AL93 splice site [id number]>O Additional Findings? Microsatellite status MS-Stable Tumor Mutation Burden TMB-Intermediate; 8 Muts/Mb Additional Disease-relevant Genes with No Reportable Alterations Identified? EGFR ALK BRAF MET RET ERBB2 ROS1  PRIOR THERAPY:  1) Palliative radiotherapy to the chest and sacrum under the care of Dr. Tammi Klippel. 2)  Systemic chemotherapy with carboplatin for AUC of 5, Alimta 500 MG/M2 and Avastin 15 MG/KG every 3 weeks. Status post 3 cycles. First dose was given 12/22/2016. Last cycle was given on 02/02/2017, discontinued secondary to disease progression.  CURRENT THERAPY: Treatment with immunotherapy with Ketruda (pembrolizumab) 200 MG IV every 3 weeks. First dose 03/02/2017. Status post 6 cycles.  INTERVAL HISTORY: Karen Vasquez 66 y.o. female returns to the clinic today for follow-up visit accompanied by her husband. The patient is feeling fine today with no specific complaints except for occasional shortness of breath with exertion. She denied having any significant chest pain, cough or hemoptysis. She denied having any skin rash or diarrhea she has no nausea, vomiting or constipation. She has been tolerating her treatment with [place] Math (pembrolizumab) fairly well with no significant adverse effects. She had repeat CT scan of the chest, abdomen and pelvis performed recently and  she is here for evaluation and discussion of her scan results.   MEDICAL HISTORY: Past Medical History:  Diagnosis Date  . Adenocarcinoma of left lung, stage 4 ([place]) 12/16/2016  . Arthritis   . Asthma due to seasonal allergies   . Chronic fatigue 02/23/2017  . Encounter for antineoplastic chemotherapy 12/17/2016  . GERD (gastroesophageal reflux disease)   . Goals of care, counseling/discussion 12/17/2016  . Hernia of abdominal wall    umbilical  . Hypertension 01/09/2017  . No pertinent past medical history    COLD UPPER RESP STARTED ZPAK 6/14  . Recurrent upper respiratory infection (URI)    recent sinus inf  . Sinus infection 02/25/2017  . Thyroid nodule   . Urine blood    hematuria    ALLERGIES:  is allergic to prednisone; fenofibrate; quinolones; statins; and septra [sulfamethoxazole-trimethoprim].  MEDICATIONS:  Current Outpatient Prescriptions  Medication Sig Dispense Refill  . aspirin EC 81 MG tablet Take 81 mg by mouth daily.    . calcium gluconate 500 MG tablet Take 1 tablet by mouth 2 (two) times daily.    . Cholecalciferol (VITAMIN D) 2000 units tablet Take 2,000 Units by mouth daily.    Marland Kitchen conjugated estrogens (PREMARIN) vaginal cream Place 1 Applicatorful vaginally daily.    . Cranberry 500 MG TABS Take 1 tablet by mouth daily.     Marland Kitchen ezetimibe (ZETIA) 10 MG tablet Take 10 mg by mouth daily.    Marland Kitchen ketotifen (ZADITOR) 0.025 % ophthalmic solution Place 1 drop into both eyes 2 (two) times daily.    Marland Kitchen lidocaine-prilocaine (EMLA) cream Apply 1 application topically as needed. Apply 1-2 tsp 1-2 hours prior to labs or chemotherapy 30 g 0  . loratadine (CLARITIN) 10 MG tablet  Take 10 mg by mouth daily.    Marland Kitchen LORazepam (ATIVAN) 0.5 MG tablet Take 1 tablet (0.5 mg total) by mouth every 8 (eight) hours. (Patient not taking: Reported on 06/15/2017) 30 tablet 0  . LORazepam (ATIVAN) 0.5 MG tablet 1 tablet po 30 minutes prior to radiation or MRI 30 tablet 0  . Multiple Vitamin  (MULTIVITAMIN) tablet Take 1 tablet by mouth daily.    . ondansetron (ZOFRAN) 8 MG tablet Take 1 tablet (8 mg total) by mouth every 8 (eight) hours as needed for nausea or vomiting. (Patient not taking: Reported on 06/15/2017) 30 tablet 0  . oxyCODONE (OXY IR/ROXICODONE) 5 MG immediate release tablet Take 1-3 tablets (5-15 mg total) by mouth every 4 (four) hours as needed for severe pain. (Patient not taking: Reported on 06/15/2017) 100 tablet 0  . pantoprazole (PROTONIX) 40 MG tablet Take 40 mg by mouth 2 (two) times daily.     . prochlorperazine (COMPAZINE) 10 MG tablet TAKE ONE TABLET BY MOUTH EVERY 6 HOURS AS NEEDED FOR NAUSEA/VOMITING (Patient not taking: Reported on 06/15/2017) 30 tablet 0  . raloxifene (EVISTA) 60 MG tablet Take 60 mg by mouth daily.    . sodium chloride (OCEAN) 0.65 % nasal spray Place 1 spray into the nose daily as needed for congestion.     . temazepam (RESTORIL) 30 MG capsule Take 1 capsule (30 mg total) by mouth at bedtime as needed for sleep. 30 capsule 0  . trimethoprim (TRIMPEX) 100 MG tablet Take 100 mg by mouth daily.     Marland Kitchen VASCEPA 1 g CAPS Take 1 tablet by mouth 2 (two) times daily.    . vitamin C (ASCORBIC ACID) 250 MG tablet Take 250 mg by mouth daily.     No current facility-administered medications for this visit.     SURGICAL HISTORY:  Past Surgical History:  Procedure Laterality Date  . ABDOMINAL HYSTERECTOMY  08   vag, rectocele repair , bladder sling  . ANTERIOR CERVICAL DECOMP/DISCECTOMY FUSION  05/09/2012   Procedure: ANTERIOR CERVICAL DECOMPRESSION/DISCECTOMY FUSION 1 LEVEL;  Surgeon: Eustace Moore, MD;  Location: [facility];  Service: Neurosurgery;  Laterality: N/A;  anterior cervical decompression fusion cervical five-six  . BREAST CYST EXCISION  96   fibroadenoma lft  . BREAST SURGERY     03  . CARPAL TUNNEL RELEASE  05   bil  . DILATION AND CURETTAGE OF UTERUS  03    polyps  . EYE SURGERY     cataracts bilat; left eye surgery   . IR FLUORO  GUIDE PORT INSERTION RIGHT  05/09/2017  . IR US GUIDE VASC ACCESS RIGHT  05/09/2017  . TUBAL LIGATION  88    REVIEW OF SYSTEMS:  Constitutional: negative Eyes: negative Ears, nose, mouth, throat, and face: negative Respiratory: positive for dyspnea on exertion Cardiovascular: negative Gastrointestinal: negative Genitourinary:negative Integument/breast: negative Hematologic/lymphatic: negative Musculoskeletal:negative Neurological: negative Behavioral/Psych: negative Endocrine: negative Allergic/Immunologic: negative   PHYSICAL EXAMINATION: General appearance: alert, cooperative and no distress Head: Normocephalic, without obvious abnormality, atraumatic Neck: no adenopathy, no JVD, supple, symmetrical, trachea midline and thyroid not enlarged, symmetric, no tenderness/mass/nodules Lymph nodes: Cervical, supraclavicular, and axillary nodes normal. Resp: clear to auscultation bilaterally Back: symmetric, no curvature. ROM normal. No CVA tenderness. Cardio: regular rate and rhythm, S1, S2 normal, no murmur, click, rub or gallop GI: soft, non-tender; bowel sounds normal; no masses,  no organomegaly Extremities: extremities normal, atraumatic, no cyanosis or edema Neurologic: Alert and oriented X 3, normal strength  and tone. Normal symmetric reflexes. Normal coordination and gait  ECOG PERFORMANCE STATUS: 1 - Symptomatic but completely ambulatory  Blood pressure 119/63, pulse 82, temperature 98.7 F (37.1 C), temperature source Oral, resp. rate 17, height 5' 4.5" (1.638 m), weight 144 lb 14.4 oz (65.7 kg), SpO2 98 %.  LABORATORY DATA: Lab Results  Component Value Date   WBC 16.6 (H) 06/15/2017   HGB 10.9 (L) 06/15/2017   HCT 33.0 (L) 06/15/2017   MCV 90.2 06/15/2017   PLT 284 06/15/2017      Chemistry      Component Value Date/Time   NA 139 06/15/2017 1022   K 4.0 06/15/2017 1022   CL 105 06/06/2017 1120   CO2 23 06/15/2017 1022   BUN 23.4 06/15/2017 1022   CREATININE  0.9 06/15/2017 1022      Component Value Date/Time   CALCIUM 9.6 06/15/2017 1022   ALKPHOS 116 06/15/2017 1022   AST 17 06/15/2017 1022   ALT 13 06/15/2017 1022   BILITOT 0.26 06/15/2017 1022       RADIOGRAPHIC STUDIES: Dg Chest 2 View  Result Date: 06/06/2017 CLINICAL DATA:  Possible aspiration following an episode of nausea and reflux and abdominal discomfort last night. Stage IV lung malignancy. Ongoing cough and chest congestion. EXAM: CHEST  2 VIEW COMPARISON:  CT scan of the chest of May 12, 2017 FINDINGS: The lungs are well-expanded. There is no acute focal infiltrate. There is chronically increased density in the posterior aspect of the left upper lobe. There is no pleural effusion. The heart is normal in size. The pulmonary vascularity is not engorged. The power port catheter tip projects over the midportion of the SVC. There is mild multilevel degenerative disc disease of the thoracic spine. There is stable partial wedge compression of the body of T12. IMPRESSION: No definite evidence of acute aspiration pneumonia or atelectasis. Chronic changes in the posterior aspect of the left upper lung likely reflecting postradiation change. No CHF. Electronically Signed   By: David  Martinique M.D.   On: 06/06/2017 10:22   Ct Chest W Contrast  Result Date: 07/04/2017 CLINICAL DATA:  Metastatic left lung cancer. Prior chemotherapy and radiation therapy, Keytruda in progress. Left chest discomfort for 2 weeks. Nausea. EXAM: CT CHEST, ABDOMEN, AND PELVIS WITH CONTRAST TECHNIQUE: Multidetector CT imaging of the chest, abdomen and pelvis was performed following the standard protocol during bolus administration of intravenous contrast. CONTRAST:  153m ISOVUE-300 IOPAMIDOL (ISOVUE-300) INJECTION 61% COMPARISON:  Multiple exams, including 05/12/2017 FINDINGS: CT CHEST FINDINGS Cardiovascular: Atherosclerotic aortic arch. Right IJ Port-A-Cath tip: Right atrium. Mediastinum/Nodes: Small hiatal hernia. Mild  wall thickening diffusely in the thoracic esophagus. Right hilar node 0.9 cm in short axis on image 27/2, previously 0.8 cm. Lungs/Pleura: Bilateral paramediastinal densities favoring radiation fibrosis, slightly more confluent in the right upper lobe with some mild worsening of volume loss. Slightly more right paramediastinal volume loss as well. In the superior segment right lower lobe there is a 1.4 by 1.2 cm sub solid nodule on image 58/4 which was not present previously. Increased atelectasis in the lingula. Musculoskeletal: Lower cervical plate and screw fixator. Several old rib deformities favoring remote prior fractures. Stable heterogeneity in the thoracic spine with scattered sclerotic lesions compatible with prior osseous metastatic disease, not appreciably changed. Stable 50% compression fracture at T12 with about 3 mm of posterior bony retropulsion. CT ABDOMEN PELVIS FINDINGS Hepatobiliary: Hypodense liver lesions are most concentrated in the dome of the right hepatic lobe right dominant primarily cystic  lesion measures 3.6 cm transversely, not changed from prior. Overall the appearance and distribution is stable. Note is made of an enhancing 1.1 cm lesion in segment 6 on image 72/6 which is stable and probably a small flash filling hemangioma. Gallbladder unremarkable. Pancreas: Unremarkable Spleen: Unremarkable Adrenals/Urinary Tract: Left adrenal mass 4.4 by 3.6 cm, formerly 4.1 by 3.3 cm. Medial limb right adrenal mass 2.2 by 4.5 cm, formerly 1.7 by 3.7 cm. Lateral limb right adrenal mass 3.3 by 2.7 cm, formerly 3.3 by 2.3 cm. Benign-appearing right kidney lower pole cyst. 1.8 cm stone along the right-sided the urinary bladder. Stomach/Bowel: Unremarkable Vascular/Lymphatic: Portacaval node 1.6 cm in short axis on image 61/2, formerly 1.2 cm. A porta hepatis node measures 1.1 cm in short axis on image 59/2, formerly 1.0 cm. Reproductive: 9 cm in diameter vaginal ring. Uterus absent. No adnexal  abnormality. Other: No supplemental non-categorized findings. Musculoskeletal: Pelvic floor laxity with low position of the anorectal junction. Grade 1 anterolisthesis at L4-5 appears degenerative in nature. Transitional L5 vertebra. Umbilical hernia contains adipose tissue. Stable osseous manifestations metastatic disease in the lumbar spine and pelvis. Suspected small right hip joint effusion. Bilateral degenerative arthropathy of both hips. IMPRESSION: 1. Enlarging metastatic adrenal lesions. Mild increase in porta hepatis adenopathy. Appearance compatible with progression. 2. The liver lesions and bony lesions appear stable. 3. 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 4. Sub solid irregular lesion medially in the superior segment right lower lobe, probably inflammatory or related to radiation therapy, warrants surveillance. This is new compared to prior. 5. Stable appearance of 50% compression fracture T12. 6. Pelvic floor laxity with low position of the anorectal junction. A vaginal ring is in place. 7. Suspected small right hip joint effusion. Electronically Signed   By: Van Clines M.D.   On: 07/04/2017 13:25   Ct Abdomen Pelvis W Contrast  Result Date: 07/04/2017 CLINICAL DATA:  Metastatic left lung cancer. Prior chemotherapy and radiation therapy, Keytruda in progress. Left chest discomfort for 2 weeks. Nausea. EXAM: CT CHEST, ABDOMEN, AND PELVIS WITH CONTRAST TECHNIQUE: Multidetector CT imaging of the chest, abdomen and pelvis was performed following the standard protocol during bolus administration of intravenous contrast. CONTRAST:  140m ISOVUE-300 IOPAMIDOL (ISOVUE-300) INJECTION 61% COMPARISON:  Multiple exams, including 05/12/2017 FINDINGS: CT CHEST FINDINGS Cardiovascular: Atherosclerotic aortic arch. Right IJ Port-A-Cath tip: Right atrium. Mediastinum/Nodes: Small hiatal hernia. Mild wall thickening diffusely in the thoracic esophagus.  Right hilar node 0.9 cm in short axis on image 27/2, previously 0.8 cm. Lungs/Pleura: Bilateral paramediastinal densities favoring radiation fibrosis, slightly more confluent in the right upper lobe with some mild worsening of volume loss. Slightly more right paramediastinal volume loss as well. In the superior segment right lower lobe there is a 1.4 by 1.2 cm sub solid nodule on image 58/4 which was not present previously. Increased atelectasis in the lingula. Musculoskeletal: Lower cervical plate and screw fixator. Several old rib deformities favoring remote prior fractures. Stable heterogeneity in the thoracic spine with scattered sclerotic lesions compatible with prior osseous metastatic disease, not appreciably changed. Stable 50% compression fracture at T12 with about 3 mm of posterior bony retropulsion. CT ABDOMEN PELVIS FINDINGS Hepatobiliary: Hypodense liver lesions are most concentrated in the dome of the right hepatic lobe right dominant primarily cystic lesion measures 3.6 cm transversely, not changed from prior. Overall the appearance and distribution is stable. Note is made of an enhancing 1.1 cm lesion in segment 6 on image 72/6 which is stable and  probably a small flash filling hemangioma. Gallbladder unremarkable. Pancreas: Unremarkable Spleen: Unremarkable Adrenals/Urinary Tract: Left adrenal mass 4.4 by 3.6 cm, formerly 4.1 by 3.3 cm. Medial limb right adrenal mass 2.2 by 4.5 cm, formerly 1.7 by 3.7 cm. Lateral limb right adrenal mass 3.3 by 2.7 cm, formerly 3.3 by 2.3 cm. Benign-appearing right kidney lower pole cyst. 1.8 cm stone along the right-sided the urinary bladder. Stomach/Bowel: Unremarkable Vascular/Lymphatic: Portacaval node 1.6 cm in short axis on image 61/2, formerly 1.2 cm. A porta hepatis node measures 1.1 cm in short axis on image 59/2, formerly 1.0 cm. Reproductive: 9 cm in diameter vaginal ring. Uterus absent. No adnexal abnormality. Other: No supplemental non-categorized  findings. Musculoskeletal: Pelvic floor laxity with low position of the anorectal junction. Grade 1 anterolisthesis at L4-5 appears degenerative in nature. Transitional L5 vertebra. Umbilical hernia contains adipose tissue. Stable osseous manifestations metastatic disease in the lumbar spine and pelvis. Suspected small right hip joint effusion. Bilateral degenerative arthropathy of both hips. IMPRESSION: 1. Enlarging metastatic adrenal lesions. Mild increase in porta hepatis adenopathy. Appearance compatible with progression. 2. The liver lesions and bony lesions appear stable. 3. 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 4. Sub solid irregular lesion medially in the superior segment right lower lobe, probably inflammatory or related to radiation therapy, warrants surveillance. This is new compared to prior. 5. Stable appearance of 50% compression fracture T12. 6. Pelvic floor laxity with low position of the anorectal junction. A vaginal ring is in place. 7. Suspected small right hip joint effusion. Electronically Signed   By: Van Clines M.D.   On: 07/04/2017 13:25    ASSESSMENT AND PLAN:  This is a very pleasant 66 years old white female with a stage IV non-small cell lung cancer was no actual mutations anteriorly 1 suppression of 70%. She was initially started on systemic chemotherapy with carboplatin, Alimta and Avastin for 3 cycles but this was discontinued secondary to disease progression. She is currently on treatment with immunotherapy with [place] status post 6 cycles and she tolerated the last cycle of her treatment well. She had repeat CT scan of the chest, abdomen and pelvis performed recently. Her scan showed a stable appearance of the lung and liver but there was enlarging metastatic adrenal lesions as well as mild increase in the porta hepatis adenopathy. I personally and independently reviewed the scan images and discuss the results and  showed the images to the patient and her husband. We discussed several options for management of her condition including discontinuation of the immunotherapy and is switching her to systemic chemotherapy with docetaxel and Cyramza versus continuation of the immunotherapy with [place] and palliative radiotherapy to the bilateral adrenal gland versus referral to a second opinion at [facility] for any clinical trials. After discussion of the options that the patient would like to continue her current treatment with immunotherapy for now and I will refer her to Dr. Tammi Klippel for consideration of palliative radiotherapy to the enlarging adrenal gland lesions. I also recommended for the patient to proceed with the second opinion at [facility] for reevaluation and consideration of clinical trial if she should've this option. She will proceed with cycle #7 today. I will see her back for follow-up visit in 3 weeks for evaluation before starting cycle #8. For pain management the patient was given a refill of oxycodone and for insomnia I gave her refills for Restoril. The patient was advised to call immediately if she has any concerning symptoms in  the interval. The patient voices understanding of current disease status and treatment options and is in agreement with the current care plan. All questions were answered. The patient knows to call the clinic with any problems, questions or concerns. We can certainly see the patient much sooner if necessary.  Disclaimer: This note was dictated with voice recognition software. Similar sounding words can inadvertently be transcribed and may not be corrected upon review.

## 2017-07-06 NOTE — Patient Instructions (Signed)
[facility] Discharge Instructions for Patients Receiving Chemotherapy  Today you received the following chemotherapy agents: Keytruda   To help prevent nausea and vomiting after your treatment, we encourage you to take your nausea medication as directed    If you develop nausea and vomiting that is not controlled by your nausea medication, call the clinic.   BELOW ARE SYMPTOMS THAT SHOULD BE REPORTED IMMEDIATELY:  *FEVER GREATER THAN 100.5 F  *CHILLS WITH OR WITHOUT FEVER  NAUSEA AND VOMITING THAT IS NOT CONTROLLED WITH YOUR NAUSEA MEDICATION  *UNUSUAL SHORTNESS OF BREATH  *UNUSUAL BRUISING OR BLEEDING  TENDERNESS IN MOUTH AND THROAT WITH OR WITHOUT PRESENCE OF ULCERS  *URINARY PROBLEMS  *BOWEL PROBLEMS  UNUSUAL RASH Items with * indicate a potential emergency and should be followed up as soon as possible.  Feel free to call the clinic you have any questions or concerns. The clinic phone number is [phone].  Please show the CHEMO ALERT CARD at check-in to the Emergency Department and triage nurse.   

## 2017-07-06 NOTE — Telephone Encounter (Signed)
Gave patient avs and calendar for September and 3 weeks out also informed patient she would be contacted about the referral

## 2017-07-10 ENCOUNTER — Telehealth: Payer: Self-pay | Admitting: Internal Medicine

## 2017-07-10 DIAGNOSIS — I10 Essential (primary) hypertension: Secondary | ICD-10-CM | POA: Diagnosis not present

## 2017-07-10 DIAGNOSIS — E7800 Pure hypercholesterolemia, unspecified: Secondary | ICD-10-CM | POA: Diagnosis not present

## 2017-07-10 DIAGNOSIS — E118 Type 2 diabetes mellitus with unspecified complications: Secondary | ICD-10-CM | POA: Diagnosis not present

## 2017-07-10 NOTE — Telephone Encounter (Signed)
Faxed records to Dr. Durenda Hurt [phone]. Waiting for appt.

## 2017-07-12 DIAGNOSIS — C7989 Secondary malignant neoplasm of other specified sites: Secondary | ICD-10-CM | POA: Diagnosis not present

## 2017-07-12 DIAGNOSIS — C799 Secondary malignant neoplasm of unspecified site: Secondary | ICD-10-CM | POA: Diagnosis not present

## 2017-07-12 DIAGNOSIS — C3412 Malignant neoplasm of upper lobe, left bronchus or lung: Secondary | ICD-10-CM | POA: Diagnosis not present

## 2017-07-14 DIAGNOSIS — C7971 Secondary malignant neoplasm of right adrenal gland: Secondary | ICD-10-CM | POA: Diagnosis not present

## 2017-07-14 DIAGNOSIS — C7972 Secondary malignant neoplasm of left adrenal gland: Secondary | ICD-10-CM | POA: Diagnosis not present

## 2017-07-14 DIAGNOSIS — C7931 Secondary malignant neoplasm of brain: Secondary | ICD-10-CM | POA: Diagnosis not present

## 2017-07-14 DIAGNOSIS — C3492 Malignant neoplasm of unspecified part of left bronchus or lung: Secondary | ICD-10-CM | POA: Diagnosis not present

## 2017-07-18 NOTE — Progress Notes (Signed)
GU Location of Tumor / Histology:Enlarging metastatic adrenal lesions  ROZLYN YERBY presented  months ago with signs/symptoms of: Having discomfort in her chest including SOB with exertion and back ache.  Biopsies of  (if applicable) revealed:  94-49-67 CT Abdomen pelvis w contrast enlarging metastatic adrenal lesions  Diagnosis 12-08-16 Soft Tissue Needle Core Biopsy, Subxyphoid region of Upper Abdominal Wall - METASTATIC POORLY DIFFERENTIATED ADENOCARCINOMA, SEE COMMENT. Microscopic Comment The tumor cells stain with cytokeratin 7, NapsinA, TTF-1 and p63 (weak focal). Cytokeratin 5/6 and cytokeratin 20 are negative. This immunophenotype is consistent with a primary lung adenocarcinoma. Dr. Avis Epley has reviewed the case. The case was called to Dr. Melvyn Novas on 12/12/2016. FoundationOne will be ordered.   Diagnosis 11-21-16 Liver, needle/core biopsy - BENIGN LIVER WITH STEATOSIS AND SCATTERED CHRONIC INFLAMMATION. - MASS LESIONAL TISSUE IS NOT IDENTIFIED  Past/Anticipated interventions by urology, if any: N/A Saw Dr Matilde Sprang 06-09-17 taking Trimpex 100 mg daily and craneberry 500 mg daily  Past/Anticipated interventions by medical oncology, if any: Treatment with immunotherapy with Ketruda (pembrolizumab) 200 MG IV every 3 weeks. First dose 03/02/2017. Status post 6 cycles Dr. Julien Nordmann  Systemic chemotherapy with carboplatin for AUC of 5, Alimta 500 MG/M2 and Avastin 15 MG/KG every 3 weeks. Status post 3 cycles. First dose was given 12/22/2016. Last cycle was given on 02/02/2017, discontinued secondary to disease progression  Weight changes, if any: No Wt Readings from Last 3 Encounters:  07/19/17 144 lb (65.3 kg)  07/06/17 144 lb 14.4 oz (65.7 kg)  06/15/17 142 lb 14.4 oz (64.8 kg)    Bowel/Bladder complaints, if RFF:MBWGYK bowel and bladder patterns. Had diarrhea  Nausea/Vomiting, if [id number] 27,2018 had nausea and vomiting once also had diarrhea  Pain issues, if any:  1/10 back  Oxycodone  SAFETY ISSUES: Prior radiation?  01-04-17-01-23-17 Chest, sacrum,liver  01/04/17 - 01/23/17                          Chest treated to 35 Gy in 14 fractions. PTV 1-4 treated to 20 Gy in 1 fraction. Sacrum treated to 20 Gy in 5 fractions.   Pacemaker/ICD? : No  Possible current pregnancy?: No  Is the patient on methotrexate? No  Current Complaints / other details:65 y.o.married woman mother of three with a history of adenocarcinoma of the left lung, stage IV ([place]), with liver,bone metastasis, adrenal glands as well as  abdominal wall nodules and brain metastases. Waiting on trial study at [facility] saw doctor on Friday 07-14-17 suppose to receive a call to go to sign paper work before starting the trial study at [facility] in [place], [place]. BP 113/66   Pulse 91   Temp 98.5 F (36.9 C) (Oral)   Resp 18   Ht 5' 4.5" (1.638 m)   Wt 144 lb (65.3 kg)   SpO2 99%   BMI 24.34 kg/m

## 2017-07-19 ENCOUNTER — Telehealth: Payer: Self-pay | Admitting: Urology

## 2017-07-19 ENCOUNTER — Ambulatory Visit
Admission: RE | Admit: 2017-07-19 | Discharge: 2017-07-19 | Disposition: A | Discharge status: Home / Self Care | Payer: BLUE CROSS/BLUE SHIELD | Source: Ambulatory Visit | Attending: Radiation Oncology | Admitting: Radiation Oncology

## 2017-07-19 ENCOUNTER — Encounter: Payer: Self-pay | Admitting: Urology

## 2017-07-19 VITALS — BP 113/66 | HR 91 | Temp 98.5°F | Resp 18 | Ht 64.5 in | Wt 144.0 lb

## 2017-07-19 DIAGNOSIS — Z79899 Other long term (current) drug therapy: Secondary | ICD-10-CM | POA: Diagnosis not present

## 2017-07-19 DIAGNOSIS — C7931 Secondary malignant neoplasm of brain: Secondary | ICD-10-CM | POA: Insufficient documentation

## 2017-07-19 DIAGNOSIS — Z888 Allergy status to other drugs, medicaments and biological substances status: Secondary | ICD-10-CM | POA: Diagnosis not present

## 2017-07-19 DIAGNOSIS — J329 Chronic sinusitis, unspecified: Secondary | ICD-10-CM | POA: Diagnosis not present

## 2017-07-19 DIAGNOSIS — Z803 Family history of malignant neoplasm of breast: Secondary | ICD-10-CM | POA: Diagnosis not present

## 2017-07-19 DIAGNOSIS — Z510 Encounter for antineoplastic radiation therapy: Secondary | ICD-10-CM | POA: Insufficient documentation

## 2017-07-19 DIAGNOSIS — C7802 Secondary malignant neoplasm of left lung: Secondary | ICD-10-CM | POA: Diagnosis not present

## 2017-07-19 DIAGNOSIS — Z90710 Acquired absence of both cervix and uterus: Secondary | ICD-10-CM | POA: Insufficient documentation

## 2017-07-19 DIAGNOSIS — C7970 Secondary malignant neoplasm of unspecified adrenal gland: Secondary | ICD-10-CM | POA: Diagnosis not present

## 2017-07-19 DIAGNOSIS — C7951 Secondary malignant neoplasm of bone: Secondary | ICD-10-CM | POA: Diagnosis not present

## 2017-07-19 DIAGNOSIS — Z9889 Other specified postprocedural states: Secondary | ICD-10-CM | POA: Diagnosis not present

## 2017-07-19 DIAGNOSIS — K219 Gastro-esophageal reflux disease without esophagitis: Secondary | ICD-10-CM | POA: Insufficient documentation

## 2017-07-19 DIAGNOSIS — Z923 Personal history of irradiation: Secondary | ICD-10-CM | POA: Diagnosis not present

## 2017-07-19 DIAGNOSIS — Z8042 Family history of malignant neoplasm of prostate: Secondary | ICD-10-CM | POA: Diagnosis not present

## 2017-07-19 DIAGNOSIS — C7972 Secondary malignant neoplasm of left adrenal gland: Secondary | ICD-10-CM | POA: Diagnosis not present

## 2017-07-19 DIAGNOSIS — C787 Secondary malignant neoplasm of liver and intrahepatic bile duct: Secondary | ICD-10-CM | POA: Diagnosis not present

## 2017-07-19 DIAGNOSIS — J45909 Unspecified asthma, uncomplicated: Secondary | ICD-10-CM | POA: Insufficient documentation

## 2017-07-19 DIAGNOSIS — I10 Essential (primary) hypertension: Secondary | ICD-10-CM | POA: Insufficient documentation

## 2017-07-19 DIAGNOSIS — C3492 Malignant neoplasm of unspecified part of left bronchus or lung: Secondary | ICD-10-CM

## 2017-07-19 DIAGNOSIS — C3412 Malignant neoplasm of upper lobe, left bronchus or lung: Secondary | ICD-10-CM | POA: Diagnosis not present

## 2017-07-19 DIAGNOSIS — E041 Nontoxic single thyroid nodule: Secondary | ICD-10-CM | POA: Diagnosis not present

## 2017-07-19 NOTE — Progress Notes (Signed)
Radiation Oncology         [phone] ________________________________  Follow-up New Visit  Name: Karen Vasquez MRN: [id number]  Date: 07/19/2017  DOB: 1951-10-25  CC:Kim, Jeneen Rinks, MD  Karen Bears, MD   REFERRING PHYSICIAN: Curt Bears, MD  DIAGNOSIS: 66 y.o. woman with adenocarcinoma of the left lung, stage IV ([place]), with liver and brain metastases, now with bilateral enlarging adrenal lesions.  INTERVAL SINCE LAST RADIATION: 6 months 01/04/17-01/23/17: 35 Gy to the chest, 20 Gy to PTV 1-4, 20 Gy to sacrum  01/09/17: Karen Vasquez received stereotactic radiosurgery to the following targets: Four targets (PTV1 Rt Choroid 24 mm, PTV2 Lt Frontal 6 mm, PTV3 Rt Parietal 2 mm, PTV4 Lt Convexity 7 mm) were treated using 4 Dynamic Conformal Arcs to a prescription dose of 18-20 Gy.  ExacTrac registration was performed for each couch angle.  The 100% isodose line was prescribed.  6 MV X-rays were delivered in the flattening filter free beam mode.  NARRATIVE: Karen Vasquez is a 66 y.o. female seen at the request of Dr. Julien Vasquez for consideration of treatment options for enlarging bilateral adrenal lesions.  In summary, the patient was found to have a poorly defined 3.7 cm lesion in the mediastinal prevascular space and a 1.8 spiculated lesion in the anterior medial left upper lobe on a CT scan of the chest and neck on 11/01/16.  PET scan on 11/17/16 showed widespread malignancy with metastatic disease to the liver, bilateral adrenal glands, abdominal lymph nodes, and bony metastasis. She was seen by Dr. Julien Vasquez on 12/16/16, and was started on Carboplatin, Alimta, and Avastin on 12/22/16. She completed 3 cycles, with her last on 02/02/17.  The patient was seen in radiation oncology on 12/26/16 and completed palliative radiation to the chest, thoracic spine, and sacrum on 01/23/17.    The patient underwent Brain MRI on 12/20/2016. This scan showed a small intraparenchymal metastatic lesion within  the left frontal lobe with very mild surrounding vasogenic edema, with no mass effect or hemorrhage. Also noted was asymmetrically enlarged and contrast-enhancing right choroid plexus. In the context of known widespread metastatic disease, this is concerning for a metastasis. There was also a small left frontal convexity dural-based mass, most consistent with meningioma. However, in this scenario, a dural-based metastasis would be difficult to exclude.   Repeat MRI on 01/01/17 for [facility] planning purposes demonstrated a new 12 mm curvilinear enhancement in the posterior right cerebellum; indeterminate but felt to most resemble a subacute lacunar infarct.  She has completed SRS to the brain lesions on 01/09/17.  Surveillance CT C/A/P on 02/21/17 demonstrated disease progression with increased size and number of liver metastases despite continued systemic chemotherapy. The LUL nodule measured 0.8 x 0.6 cm, decreased from 1.7 x 1.7 cm previously and mediastinal adenopathy showed improvement as well. New moderate pathologic T12 vertebral compression fracture. Small newly sclerotic lesions are noted throughout the thoracic spine, sternum and bilateral ribs but may represent treatment effect. Adrenal mets are stable to decreased in size.   Carboplatin, Alimta, and Avastin were discontinued due to disease progression and she was started on immunotherapy with Keytruda (pembrolizumab) 200 MG IV every 3 weeks beginning on 03/02/17.  Follow up MRI brain 05/08/17 demonstrated stability of her metastatic brain disease with no new or enlarging tumors.  There is a stable 1 cm frontal convexity meningioma.    She continued on Keytruda immunotherapy, status post 6 cycles, under the care and direction of Dr. Earlie Vasquez.  Unfortunately, recent  follow up imaging studies have demonstrated progressive enlarging metastatic adrenal lesions (Left adrenal mass 4.4 x 3.6 cm, formerly 4.1 x 3.3 cm. Medial limb right adrenal mass 2.2 x 4.5 cm,  formerly 1.7 x 3.7 cm. Lateral limb right adrenal mass 3.3 x 2.7 cm, formerly 3.3 x 2.3 cm.) as well as mild increase in the porta hepatis adenopathy.  There was a stable appearance of the lung and liver lesions.  She has recently consulted with Dr. Aniceto Vasquez at [facility] and is being considered for enrollment into a clinical trial for disease management.  Currently, systemic treatment is on hold while she awaits the decision regarding acceptance into the clinical trial.  ROS: She reports that overall she feels well aside from persistent fatigue. She continues with intermittent blurred vision for short periods of time, mild shortness of breath when talking and with exertion, and a dry cough. 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 She is tolerating the [facility] immunotherapy very well without nausea or vomiting.  She denies headaches, productive cough, pain with swallowing, or skin irritation. She reports a little bit of memory issues that do not affect her daily life. Today, she was able to answer all questions appropriately and without difficulty or problems with word finding.     PAST MEDICAL HISTORY:  Past Medical History:  Diagnosis Date  . Adenocarcinoma of left lung, stage 4 ([place]) 12/16/2016  . Arthritis   . Asthma due to seasonal allergies   . Chronic fatigue 02/23/2017  . Encounter for antineoplastic chemotherapy 12/17/2016  . GERD (gastroesophageal reflux disease)   . Goals of care, counseling/discussion 12/17/2016  . Hernia of abdominal wall    umbilical  . Hypertension 01/09/2017  . No pertinent past medical history    COLD UPPER RESP STARTED ZPAK 6/14  . Recurrent upper respiratory infection (URI)    recent sinus inf  . Sinus infection 02/25/2017  . Thyroid nodule   . Urine blood    hematuria      PAST SURGICAL HISTORY: Past Surgical History:  Procedure Laterality Date  . ABDOMINAL HYSTERECTOMY  08   vag, rectocele repair , bladder sling  . ANTERIOR  CERVICAL DECOMP/DISCECTOMY FUSION  05/09/2012   Procedure: ANTERIOR CERVICAL DECOMPRESSION/DISCECTOMY FUSION 1 LEVEL;  Surgeon: Eustace Moore, MD;  Location: [place] [facility];  Service: Neurosurgery;  Laterality: N/A;  anterior cervical decompression fusion cervical five-six  . BREAST CYST EXCISION  96   fibroadenoma lft  . BREAST SURGERY     03  . CARPAL TUNNEL RELEASE  05   bil  . DILATION AND CURETTAGE OF UTERUS  03    polyps  . EYE SURGERY     cataracts bilat; left eye surgery   . IR FLUORO GUIDE PORT INSERTION RIGHT  05/09/2017  . IR US GUIDE VASC ACCESS RIGHT  05/09/2017  . TUBAL LIGATION  88    FAMILY HISTORY:  Family History  Problem Relation Age of Onset  . Cancer Mother        breast  . Cancer Father        prostate  . Muscular dystrophy Son     SOCIAL HISTORY:  Social History   Social History  . Marital status: Married    Spouse name: N/A  . Number of children: N/A  . Years of education: N/A   Occupational History  . Not on file.   Social History Main Topics  . Smoking status: Never Smoker  . Smokeless tobacco:  Never Used  . Alcohol use No  . Drug use: No  . Sexual activity: Not Currently   Other Topics Concern  . Not on file   Social History Narrative  . No narrative on file    ALLERGIES: Prednisone; Fenofibrate; Quinolones; Statins; and Septra [sulfamethoxazole-trimethoprim]  MEDICATIONS:  Current Outpatient Prescriptions  Medication Sig Dispense Refill  . aspirin EC 81 MG tablet Take 81 mg by mouth daily.    . calcium gluconate 500 MG tablet Take 1 tablet by mouth 2 (two) times daily.    . Cholecalciferol (VITAMIN D) 2000 units tablet Take 2,000 Units by mouth daily.    Marland Kitchen conjugated estrogens (PREMARIN) vaginal cream Place 1 Applicatorful vaginally daily.    . Cranberry 500 MG TABS Take 1 tablet by mouth daily.     Marland Kitchen ezetimibe (ZETIA) 10 MG tablet Take 10 mg by mouth daily.    Marland Kitchen ketotifen (ZADITOR) 0.025 % ophthalmic solution Place 1 drop into  both eyes 2 (two) times daily.    Marland Kitchen lidocaine-prilocaine (EMLA) cream Apply 1 application topically as needed. Apply 1-2 tsp 1-2 hours prior to labs or chemotherapy 30 g 0  . loratadine (CLARITIN) 10 MG tablet Take 10 mg by mouth daily.    Marland Kitchen LORazepam (ATIVAN) 0.5 MG tablet Take 1 tablet (0.5 mg total) by mouth every 8 (eight) hours. 30 tablet 0  . LORazepam (ATIVAN) 0.5 MG tablet 1 tablet po 30 minutes prior to radiation or MRI 30 tablet 0  . Multiple Vitamin (MULTIVITAMIN) tablet Take 1 tablet by mouth daily.    Marland Kitchen oxyCODONE (OXY IR/ROXICODONE) 5 MG immediate release tablet Take 1-3 tablets (5-15 mg total) by mouth every 4 (four) hours as needed for severe pain. 100 tablet 0  . pantoprazole (PROTONIX) 40 MG tablet Take 40 mg by mouth 2 (two) times daily.     . raloxifene (EVISTA) 60 MG tablet Take 60 mg by mouth daily.    . sodium chloride (OCEAN) 0.65 % nasal spray Place 1 spray into the nose daily as needed for congestion.     . temazepam (RESTORIL) 30 MG capsule Take 1 capsule (30 mg total) by mouth at bedtime as needed for sleep. 30 capsule 0  . trimethoprim (TRIMPEX) 100 MG tablet Take 100 mg by mouth daily.     Marland Kitchen VASCEPA 1 g CAPS Take 1 tablet by mouth 2 (two) times daily.    . vitamin C (ASCORBIC ACID) 250 MG tablet Take 250 mg by mouth daily.    . ondansetron (ZOFRAN) 8 MG tablet Take 1 tablet (8 mg total) by mouth every 8 (eight) hours as needed for nausea or vomiting. (Patient not taking: Reported on 06/15/2017) 30 tablet 0  . prochlorperazine (COMPAZINE) 10 MG tablet TAKE ONE TABLET BY MOUTH EVERY 6 HOURS AS NEEDED FOR NAUSEA/VOMITING (Patient not taking: Reported on 06/15/2017) 30 tablet 0   No current facility-administered medications for this encounter.    PHYSICAL EXAM:  Wt Readings from Last 3 Encounters:  07/19/17 144 lb (65.3 kg)  07/06/17 144 lb 14.4 oz (65.7 kg)  06/15/17 142 lb 14.4 oz (64.8 kg)   Temp Readings from Last 3 Encounters:  07/19/17 98.5 F (36.9 C) (Oral)    07/06/17 98.7 F (37.1 C) (Oral)  06/15/17 98.6 F (37 C) (Oral)   BP Readings from Last 3 Encounters:  07/19/17 113/66  07/06/17 119/63  06/15/17 (!) 117/56   Pulse Readings from Last 3 Encounters:  07/19/17 91  07/06/17 82  06/15/17 84   Pain Assessment Pain Score: 1  (Back)/10  In general this is a well appearing caucasian female in no acute distress. She is alert and oriented x4 and appropriate throughout the examination. Cardiopulmonary assessment is negative for acute distress and she exhibits normal effort.   LABORATORY DATA:  Lab Results  Component Value Date   WBC 17.9 (H) 07/06/2017   HGB 10.0 (L) 07/06/2017   HCT 30.5 (L) 07/06/2017   MCV 88.9 07/06/2017   PLT 304 07/06/2017   Lab Results  Component Value Date   NA 139 07/06/2017   K 4.3 07/06/2017   CL 105 06/06/2017   CO2 24 07/06/2017   Lab Results  Component Value Date   ALT 17 07/06/2017   AST 22 07/06/2017   ALKPHOS 106 07/06/2017   BILITOT 0.34 07/06/2017     RADIOGRAPHY: Ct Chest W Contrast  Result Date: 07/04/2017 CLINICAL DATA:  Metastatic left lung cancer. Prior chemotherapy and radiation therapy, Keytruda in progress. Left chest discomfort for 2 weeks. Nausea. EXAM: CT CHEST, ABDOMEN, AND PELVIS WITH CONTRAST TECHNIQUE: Multidetector CT imaging of the chest, abdomen and pelvis was performed following the standard protocol during bolus administration of intravenous contrast. CONTRAST:  151mL ISOVUE-300 IOPAMIDOL (ISOVUE-300) INJECTION 61% COMPARISON:  Multiple exams, including 05/12/2017 FINDINGS: CT CHEST FINDINGS Cardiovascular: Atherosclerotic aortic arch. Right IJ Port-A-Cath tip: Right atrium. Mediastinum/Nodes: Small hiatal hernia. Mild wall thickening diffusely in the thoracic esophagus. Right hilar node 0.9 cm in short axis on image 27/2, previously 0.8 cm. Lungs/Pleura: Bilateral paramediastinal densities favoring radiation fibrosis, slightly more confluent in the right upper lobe with  some mild worsening of volume loss. Slightly more right paramediastinal volume loss as well. In the superior segment right lower lobe there is a 1.4 by 1.2 cm sub solid nodule on image 58/4 which was not present previously. Increased atelectasis in the lingula. Musculoskeletal: Lower cervical plate and screw fixator. Several old rib deformities favoring remote prior fractures. Stable heterogeneity in the thoracic spine with scattered sclerotic lesions compatible with prior osseous metastatic disease, not appreciably changed. Stable 50% compression fracture at T12 with about 3 mm of posterior bony retropulsion. CT ABDOMEN PELVIS FINDINGS Hepatobiliary: Hypodense liver lesions are most concentrated in the dome of the right hepatic lobe right dominant primarily cystic lesion measures 3.6 cm transversely, not changed from prior. Overall the appearance and distribution is stable. Note is made of an enhancing 1.1 cm lesion in segment 6 on image 72/6 which is stable and probably a small flash filling hemangioma. Gallbladder unremarkable. Pancreas: Unremarkable Spleen: Unremarkable Adrenals/Urinary Tract: Left adrenal mass 4.4 by 3.6 cm, formerly 4.1 by 3.3 cm. Medial limb right adrenal mass 2.2 by 4.5 cm, formerly 1.7 by 3.7 cm. Lateral limb right adrenal mass 3.3 by 2.7 cm, formerly 3.3 by 2.3 cm. Benign-appearing right kidney lower pole cyst. 1.8 cm stone along the right-sided the urinary bladder. Stomach/Bowel: Unremarkable Vascular/Lymphatic: Portacaval node 1.6 cm in short axis on image 61/2, formerly 1.2 cm. A porta hepatis node measures 1.1 cm in short axis on image 59/2, formerly 1.0 cm. Reproductive: 9 cm in diameter vaginal ring. Uterus absent. No adnexal abnormality. Other: No supplemental non-categorized findings. Musculoskeletal: Pelvic floor laxity with low position of the anorectal junction. Grade 1 anterolisthesis at L4-5 appears degenerative in nature. Transitional L5 vertebra. Umbilical hernia contains  adipose tissue. Stable osseous manifestations metastatic disease in the lumbar spine and pelvis. Suspected small right hip joint effusion. Bilateral degenerative arthropathy of both hips. IMPRESSION:  1. Enlarging metastatic adrenal lesions. Mild increase in porta hepatis adenopathy. Appearance compatible with progression. 2. The liver lesions and bony lesions appear stable. 3. 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 4. Sub solid irregular lesion medially in the superior segment right lower lobe, probably inflammatory or related to radiation therapy, warrants surveillance. This is new compared to prior. 5. Stable appearance of 50% compression fracture T12. 6. Pelvic floor laxity with low position of the anorectal junction. A vaginal ring is in place. 7. Suspected small right hip joint effusion. Electronically Signed   By: Van Clines M.D.   On: 07/04/2017 13:25   Ct Abdomen Pelvis W Contrast  Result Date: 07/04/2017 CLINICAL DATA:  Metastatic left lung cancer. Prior chemotherapy and radiation therapy, Keytruda in progress. Left chest discomfort for 2 weeks. Nausea. EXAM: CT CHEST, ABDOMEN, AND PELVIS WITH CONTRAST TECHNIQUE: Multidetector CT imaging of the chest, abdomen and pelvis was performed following the standard protocol during bolus administration of intravenous contrast. CONTRAST:  120mL ISOVUE-300 IOPAMIDOL (ISOVUE-300) INJECTION 61% COMPARISON:  Multiple exams, including 05/12/2017 FINDINGS: CT CHEST FINDINGS Cardiovascular: Atherosclerotic aortic arch. Right IJ Port-A-Cath tip: Right atrium. Mediastinum/Nodes: Small hiatal hernia. Mild wall thickening diffusely in the thoracic esophagus. Right hilar node 0.9 cm in short axis on image 27/2, previously 0.8 cm. Lungs/Pleura: Bilateral paramediastinal densities favoring radiation fibrosis, slightly more confluent in the right upper lobe with some mild worsening of volume loss. Slightly more right  paramediastinal volume loss as well. In the superior segment right lower lobe there is a 1.4 by 1.2 cm sub solid nodule on image 58/4 which was not present previously. Increased atelectasis in the lingula. Musculoskeletal: Lower cervical plate and screw fixator. Several old rib deformities favoring remote prior fractures. Stable heterogeneity in the thoracic spine with scattered sclerotic lesions compatible with prior osseous metastatic disease, not appreciably changed. Stable 50% compression fracture at T12 with about 3 mm of posterior bony retropulsion. CT ABDOMEN PELVIS FINDINGS Hepatobiliary: Hypodense liver lesions are most concentrated in the dome of the right hepatic lobe right dominant primarily cystic lesion measures 3.6 cm transversely, not changed from prior. Overall the appearance and distribution is stable. Note is made of an enhancing 1.1 cm lesion in segment 6 on image 72/6 which is stable and probably a small flash filling hemangioma. Gallbladder unremarkable. Pancreas: Unremarkable Spleen: Unremarkable Adrenals/Urinary Tract: Left adrenal mass 4.4 by 3.6 cm, formerly 4.1 by 3.3 cm. Medial limb right adrenal mass 2.2 by 4.5 cm, formerly 1.7 by 3.7 cm. Lateral limb right adrenal mass 3.3 by 2.7 cm, formerly 3.3 by 2.3 cm. Benign-appearing right kidney lower pole cyst. 1.8 cm stone along the right-sided the urinary bladder. Stomach/Bowel: Unremarkable Vascular/Lymphatic: Portacaval node 1.6 cm in short axis on image 61/2, formerly 1.2 cm. A porta hepatis node measures 1.1 cm in short axis on image 59/2, formerly 1.0 cm. Reproductive: 9 cm in diameter vaginal ring. Uterus absent. No adnexal abnormality. Other: No supplemental non-categorized findings. Musculoskeletal: Pelvic floor laxity with low position of the anorectal junction. Grade 1 anterolisthesis at L4-5 appears degenerative in nature. Transitional L5 vertebra. Umbilical hernia contains adipose tissue. Stable osseous manifestations metastatic  disease in the lumbar spine and pelvis. Suspected small right hip joint effusion. Bilateral degenerative arthropathy of both hips. IMPRESSION: 1. Enlarging metastatic adrenal lesions. Mild increase in porta hepatis adenopathy. Appearance compatible with progression. 2. The liver lesions and bony lesions appear stable. 3. Mild progression in presumed paramediastinal fibrosis related to prior radiation  therapy. 4. Sub solid irregular lesion medially in the superior segment right lower lobe, probably inflammatory or related to radiation therapy, warrants surveillance. This is new compared to prior. 5. Stable appearance of 50% compression fracture T12. 6. Pelvic floor laxity with low position of the anorectal junction. A vaginal ring is in place. 7. Suspected small right hip joint effusion. Electronically Signed   By: Van Clines M.D.   On: 07/04/2017 13:25      IMPRESSION/PLAN: 1. 66 y.o. woman with adenocarcinoma of the left lung, stage IV ([place]), with liver and brain metastases, now with enlarging bilateral adrenal metastases.  Her systemic treatment is currently on hold pending qualification for participation in a clinical trial at [facility]. Today, we talked to the patient about the findings and workup thus far. We discussed the natural history of metastatic carcinoma and general treatment, highlighting the role of radiotherapy in the management. We discussed the available radiation techniques, and focused on the details of logistics and delivery. We reviewed the anticipated acute and late sequelae associated with radiation in this setting. The patient was encouraged to ask questions that were answered to her satisfaction. If she does not qualify for the clinical trial at [facility], she is interested in proceeding with SRS to the bilateral adrenal lesions.  We will plan to remain in close contact with her regarding her acceptance and/or progress in the clinical trial at [facility].  She knows that we are here for her and  would be happy to participate in her care as needed.  2. Brain mets. Recent MRI brain 05/08/17 shows stability of all treated lesions.  No new lesions were noted. She is scheduled for repeat brain MRI on 08/08/17.  The plan is to present her case at multidisciplinary conference and she will follow up in the office thereafter to review results and recommendations.  She knows to call sooner with any questions or concerns.    Nicholos Johns, PA-C    Tyler Pita, MD  [facility] Direct Dial: [phone]  Fax: [phone] [email or web address]  Skype  [email or web address]    Page Me

## 2017-07-19 NOTE — Addendum Note (Signed)
Encounter addended by: Freeman Caldron, PA-C on: 07/19/2017 10:38 PM<BR>    Actions taken: Pend clinical note, LOS modified, Follow-up modified

## 2017-07-19 NOTE — Telephone Encounter (Signed)
I spoke with Karen Vasquez today to confirm her intention regarding her scheduled follow up visit later today for discussion of SRS to the adrenal metastasis.  I had received a message from upstairs stating that the patient was starting a clinical trial at [facility] and had been advised against any further tx, systemic and/or XRT, at this time and therefore likely would not be coming in for her scheduled appointment.  She was recently evaluated at [facility] and is being considered for enrollment in a clinical trial.  If she is selected for the clinical trial, she will be randomized into 1 of 3 groups receiving either immunotherapy alone, immunotherapy with low dose XRT or immunotherapy with SRS at [facility].  She is required to have a biopsy of the adrenals at [facility] prior to enrollment in the clinical trial and therefore has been advised against pursuing any further treatment until after that has been completed.  She is still interested in coming in for her appointment today to discuss [place] in case she is not selected for the clinical trial at [facility].  I advised her that we are more than happy to see her today for further discussion and consideration and look forward to speaking with her.   Karen Johns, PA-C

## 2017-07-21 DIAGNOSIS — J3801 Paralysis of vocal cords and larynx, unilateral: Secondary | ICD-10-CM | POA: Diagnosis not present

## 2017-07-21 DIAGNOSIS — Z85118 Personal history of other malignant neoplasm of bronchus and lung: Secondary | ICD-10-CM | POA: Diagnosis not present

## 2017-07-22 NOTE — Addendum Note (Signed)
Encounter addended by: Tyler Pita, MD on: 07/22/2017  1:14 PM<BR>    Actions taken: Problem List reviewed, Visit diagnoses modified, Sign clinical note

## 2017-07-27 ENCOUNTER — Other Ambulatory Visit: Payer: BLUE CROSS/BLUE SHIELD

## 2017-07-27 ENCOUNTER — Ambulatory Visit: Payer: BLUE CROSS/BLUE SHIELD

## 2017-07-27 ENCOUNTER — Ambulatory Visit: Payer: BLUE CROSS/BLUE SHIELD | Admitting: Internal Medicine

## 2017-07-28 ENCOUNTER — Other Ambulatory Visit: Payer: Self-pay | Admitting: Otolaryngology

## 2017-07-31 ENCOUNTER — Encounter (HOSPITAL_COMMUNITY)
Admission: RE | Admit: 2017-07-31 | Discharge: 2017-07-31 | Disposition: A | Discharge status: Home / Self Care | Payer: BLUE CROSS/BLUE SHIELD | Source: Ambulatory Visit | Attending: Otolaryngology | Admitting: Otolaryngology

## 2017-07-31 ENCOUNTER — Encounter (HOSPITAL_COMMUNITY): Payer: Self-pay

## 2017-07-31 DIAGNOSIS — Z888 Allergy status to other drugs, medicaments and biological substances status: Secondary | ICD-10-CM | POA: Diagnosis not present

## 2017-07-31 DIAGNOSIS — Z7982 Long term (current) use of aspirin: Secondary | ICD-10-CM | POA: Diagnosis not present

## 2017-07-31 DIAGNOSIS — R9431 Abnormal electrocardiogram [ECG] [EKG]: Secondary | ICD-10-CM

## 2017-07-31 DIAGNOSIS — Z01810 Encounter for preprocedural cardiovascular examination: Secondary | ICD-10-CM | POA: Insufficient documentation

## 2017-07-31 DIAGNOSIS — I10 Essential (primary) hypertension: Secondary | ICD-10-CM | POA: Diagnosis not present

## 2017-07-31 DIAGNOSIS — Z981 Arthrodesis status: Secondary | ICD-10-CM | POA: Diagnosis not present

## 2017-07-31 DIAGNOSIS — Z79891 Long term (current) use of opiate analgesic: Secondary | ICD-10-CM | POA: Diagnosis not present

## 2017-07-31 DIAGNOSIS — Z85118 Personal history of other malignant neoplasm of bronchus and lung: Secondary | ICD-10-CM | POA: Diagnosis not present

## 2017-07-31 DIAGNOSIS — J3800 Paralysis of vocal cords and larynx, unspecified: Secondary | ICD-10-CM | POA: Insufficient documentation

## 2017-07-31 DIAGNOSIS — J3801 Paralysis of vocal cords and larynx, unilateral: Secondary | ICD-10-CM | POA: Diagnosis not present

## 2017-07-31 DIAGNOSIS — K219 Gastro-esophageal reflux disease without esophagitis: Secondary | ICD-10-CM | POA: Diagnosis not present

## 2017-07-31 LAB — BASIC METABOLIC PANEL
ANION GAP: 8 (ref 5–15)
BUN: 16 mg/dL (ref 6–20)
CO2: 25 mmol/L (ref 22–32)
Calcium: 9.6 mg/dL (ref 8.9–10.3)
Chloride: 105 mmol/L (ref 101–111)
Creatinine, Ser: 1.09 mg/dL — ABNORMAL HIGH (ref 0.44–1.00)
GFR, EST AFRICAN AMERICAN: 60 mL/min — AB (ref >60)
GFR, EST NON AFRICAN AMERICAN: 52 mL/min — AB (ref >60)
GLUCOSE: 146 mg/dL — AB (ref 65–99)
POTASSIUM: 3.6 mmol/L (ref 3.5–5.1)
Sodium: 138 mmol/L (ref 135–145)

## 2017-07-31 LAB — CBC
HEMATOCRIT: 33.5 % — AB (ref 36.0–46.0)
HEMOGLOBIN: 10.4 g/dL — AB (ref 12.0–15.0)
MCH: 27.5 pg (ref 26.0–34.0)
MCHC: 31 g/dL (ref 30.0–36.0)
MCV: 88.6 fL (ref 78.0–100.0)
Platelets: 327 10*3/uL (ref 150–400)
RBC: 3.78 MIL/uL — AB (ref 3.87–5.11)
RDW: 17.6 % — ABNORMAL HIGH (ref 11.5–15.5)
WBC: 20.8 10*3/uL — AB (ref 4.0–10.5)

## 2017-07-31 NOTE — Progress Notes (Signed)
PCP: Dr. [name]  Cardiologist: pt denies  EKG: pt denies last year  Stress test: pt denies ever  ECHO: pt denies ever  Cardiac Cath: pt denies ever  Chest x-ray: 06/06/17

## 2017-07-31 NOTE — Pre-Procedure Instructions (Signed)
Karen Vasquez  07/31/2017      [facility] [place], [place] - [address] [place] [address] Phone: [phone] Fax: [phone]    Your procedure is scheduled on August 03, 2017.  Report to [facility] Admitting at 1000 AM.  Call this number if you have problems the morning of surgery:  [phone]   Remember:  Do not eat food or drink liquids after midnight.  Take these medicines the morning of surgery with A SIP OF WATER eye drops if needed, lorazepam (ativan)- if needed, oxycodone (roxicodone)-if needed, pantoprazole (protonix), raloxifene (Evista).  7 days prior to surgery STOP taking any Aspirin, Aleve, Naproxen, Ibuprofen, Motrin, Advil, Goody's, BC's, all herbal medications, fish oil, and all vitamins   Do not wear jewelry, make-up or nail polish.  Do not wear lotions, powders, or perfumes, or deoderant.  Do not shave 48 hours prior to surgery.    Do not bring valuables to the hospital.  [facility] is not responsible for any belongings or valuables.  Contacts, dentures or bridgework may not be worn into surgery.  Leave your suitcase in the car.  After surgery it may be brought to your room.  For patients admitted to the hospital, discharge time will be determined by your treatment team.  Patients discharged the day of surgery will not be allowed to drive home.   Special instructions:   [place]- Preparing For Surgery  Before surgery, you can play an important role. Because skin is not sterile, your skin needs to be as free of germs as possible. You can reduce the number of germs on your skin by washing with CHG (chlorahexidine gluconate) Soap before surgery.  CHG is an antiseptic cleaner which kills germs and bonds with the skin to continue killing germs even after washing.  Please do not use if you have an allergy to CHG or antibacterial soaps. If your skin becomes reddened/irritated stop using the CHG.   Do not shave (including legs and underarms) for at least 48 hours prior to first CHG shower. It is OK to shave your face.  Please follow these instructions carefully.   1. Shower the NIGHT BEFORE SURGERY and the MORNING OF SURGERY with CHG.   2. If you chose to wash your hair, wash your hair first as usual with your normal shampoo.  3. After you shampoo, rinse your hair and body thoroughly to remove the shampoo.  4. Use CHG as you would any other liquid soap. You can apply CHG directly to the skin and wash gently with a scrungie or a clean washcloth.   5. Apply the CHG Soap to your body ONLY FROM THE NECK DOWN.  Do not use on open wounds or open sores. Avoid contact with your eyes, ears, mouth and genitals (private parts). Wash genitals (private parts) with your normal soap.  6. Wash thoroughly, paying special attention to the area where your surgery will be performed.  7. Thoroughly rinse your body with warm water from the neck down.  8. DO NOT shower/wash with your normal soap after using and rinsing off the CHG Soap.  9. Pat yourself dry with a CLEAN TOWEL.   10. Wear CLEAN PAJAMAS   11. Place CLEAN SHEETS on your bed the night of your first shower and DO NOT SLEEP WITH PETS.    Day of Surgery: Do not apply any deodorants/lotions. Please wear clean clothes to  the hospital/surgery center.    Please read over the following fact sheets that you were given. Pain Booklet, Coughing and Deep Breathing, MRSA Information and Surgical Site Infection Prevention

## 2017-08-01 ENCOUNTER — Telehealth: Payer: Self-pay | Admitting: Medical Oncology

## 2017-08-01 ENCOUNTER — Encounter: Payer: Self-pay | Admitting: Urology

## 2017-08-01 DIAGNOSIS — C3492 Malignant neoplasm of unspecified part of left bronchus or lung: Secondary | ICD-10-CM | POA: Diagnosis not present

## 2017-08-01 DIAGNOSIS — K7689 Other specified diseases of liver: Secondary | ICD-10-CM | POA: Diagnosis not present

## 2017-08-01 DIAGNOSIS — C7951 Secondary malignant neoplasm of bone: Secondary | ICD-10-CM | POA: Diagnosis not present

## 2017-08-01 DIAGNOSIS — C7931 Secondary malignant neoplasm of brain: Secondary | ICD-10-CM | POA: Diagnosis not present

## 2017-08-01 DIAGNOSIS — Z006 Encounter for examination for normal comparison and control in clinical research program: Secondary | ICD-10-CM | POA: Diagnosis not present

## 2017-08-01 DIAGNOSIS — Z923 Personal history of irradiation: Secondary | ICD-10-CM | POA: Diagnosis not present

## 2017-08-01 DIAGNOSIS — M4854XA Collapsed vertebra, not elsewhere classified, thoracic region, initial encounter for fracture: Secondary | ICD-10-CM | POA: Diagnosis not present

## 2017-08-01 DIAGNOSIS — C7970 Secondary malignant neoplasm of unspecified adrenal gland: Secondary | ICD-10-CM | POA: Diagnosis not present

## 2017-08-01 DIAGNOSIS — C7971 Secondary malignant neoplasm of right adrenal gland: Secondary | ICD-10-CM | POA: Diagnosis not present

## 2017-08-01 DIAGNOSIS — Z9221 Personal history of antineoplastic chemotherapy: Secondary | ICD-10-CM | POA: Diagnosis not present

## 2017-08-01 NOTE — Progress Notes (Signed)
Anesthesia Chart Review:  Pt is a 66 year old female scheduled for microlaryngoscopy with vocal cord injection on 08/03/2017 Melissa Montane, MD  - PCP is [name], MD - Oncologist is Curt Bears, MD  PMH includes:  HTN, thyroid nodule, asthma, lung cancer, GERD. Never smoker. BMI 26. S/p ACDF 05/09/12.   Medications include: ASA 81mg , zetia, protonix, trimethoprim, vascepa.   BP (!) 107/59   Pulse 90   Temp 36.8 C   Resp 18   Ht 5' 2.5" (1.588 m)   Wt 144 lb 9.6 oz (65.6 kg)   SpO2 100%   BMI 26.03 kg/m    Preoperative labs reviewed.   - WBC 20.8.  - I reached out to Dr. Julien Nordmann about pt's elevated WBC count; Dr. Julien Nordmann felt pt could proceed with surgery as scheduled. I left voicemail for Glenda in Dr. Janace Hoard' office.   CXR 06/06/17:  - No definite evidence of acute aspiration pneumonia or atelectasis. - Chronic changes in the posterior aspect of the left upper lung likely reflecting postradiation change. No CHF.  EKG 07/31/17: NSR. LAD.   CT chest, abd, pelvis 07/04/17:  1. Enlarging metastatic adrenal lesions. Mild increase in porta hepatis adenopathy. Appearance compatible with progression. 2. The liver lesions and bony lesions appear stable. 3. 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 4. Sub solid irregular lesion medially in the superior segment right lower lobe, probably inflammatory or related to radiation therapy, warrants surveillance. This is new compared to prior. 5. Stable appearance of 50% compression fracture T12. 6. Pelvic floor laxity with low position of the anorectal junction. A vaginal ring is in place. 7. Suspected small right hip joint effusion.  If no changes, I anticipate pt can proceed with surgery as scheduled.   Willeen Cass, FNP-BC [facility]/Anesthesiology Phone: [phone] 08/01/2017 11:50 AM

## 2017-08-01 NOTE — Progress Notes (Signed)
Patient called to confirm that she has been accepted into the research trial/study at [facility] and would therefore like the cancel the planned follow up MRI and follow up visit scheduled for 08/08/17 and 08/09/17 and will plan to follow up with [place] as needed.   Nicholos Johns, PA-C

## 2017-08-01 NOTE — Telephone Encounter (Signed)
Per Julien Nordmann I told Levada Dy that Karen Vasquez should be fine to have ENT procedure with elevated WBC.

## 2017-08-03 ENCOUNTER — Encounter (HOSPITAL_COMMUNITY): Payer: Self-pay | Admitting: Critical Care Medicine

## 2017-08-03 ENCOUNTER — Ambulatory Visit (HOSPITAL_COMMUNITY): Payer: BLUE CROSS/BLUE SHIELD | Admitting: Certified Registered Nurse Anesthetist

## 2017-08-03 ENCOUNTER — Ambulatory Visit (HOSPITAL_COMMUNITY): Payer: BLUE CROSS/BLUE SHIELD | Admitting: Emergency Medicine

## 2017-08-03 ENCOUNTER — Ambulatory Visit (HOSPITAL_COMMUNITY)
Admission: RE | Admit: 2017-08-03 | Discharge: 2017-08-03 | Disposition: A | Discharge status: Home / Self Care | Payer: BLUE CROSS/BLUE SHIELD | Source: Ambulatory Visit | Attending: Otolaryngology | Admitting: Otolaryngology

## 2017-08-03 ENCOUNTER — Encounter (HOSPITAL_COMMUNITY)
Admission: RE | Disposition: A | Discharge status: Home / Self Care | Payer: Self-pay | Source: Ambulatory Visit | Attending: Otolaryngology

## 2017-08-03 DIAGNOSIS — Z981 Arthrodesis status: Secondary | ICD-10-CM | POA: Insufficient documentation

## 2017-08-03 DIAGNOSIS — Z888 Allergy status to other drugs, medicaments and biological substances status: Secondary | ICD-10-CM | POA: Insufficient documentation

## 2017-08-03 DIAGNOSIS — J3800 Paralysis of vocal cords and larynx, unspecified: Secondary | ICD-10-CM | POA: Diagnosis not present

## 2017-08-03 DIAGNOSIS — Z79891 Long term (current) use of opiate analgesic: Secondary | ICD-10-CM | POA: Insufficient documentation

## 2017-08-03 DIAGNOSIS — C7970 Secondary malignant neoplasm of unspecified adrenal gland: Secondary | ICD-10-CM | POA: Diagnosis not present

## 2017-08-03 DIAGNOSIS — K219 Gastro-esophageal reflux disease without esophagitis: Secondary | ICD-10-CM | POA: Diagnosis not present

## 2017-08-03 DIAGNOSIS — Z85118 Personal history of other malignant neoplasm of bronchus and lung: Secondary | ICD-10-CM | POA: Insufficient documentation

## 2017-08-03 DIAGNOSIS — Z7982 Long term (current) use of aspirin: Secondary | ICD-10-CM | POA: Diagnosis not present

## 2017-08-03 DIAGNOSIS — C3492 Malignant neoplasm of unspecified part of left bronchus or lung: Secondary | ICD-10-CM | POA: Diagnosis not present

## 2017-08-03 DIAGNOSIS — J3801 Paralysis of vocal cords and larynx, unilateral: Secondary | ICD-10-CM | POA: Diagnosis not present

## 2017-08-03 DIAGNOSIS — I10 Essential (primary) hypertension: Secondary | ICD-10-CM | POA: Insufficient documentation

## 2017-08-03 HISTORY — PX: MICROLARYNGOSCOPY W/VOCAL CORD INJECTION: SHX2665

## 2017-08-03 SURGERY — MICROLARYNGOSCOPY, WITH VOCAL CORD INJECTION
Anesthesia: General | Site: Throat | Laterality: Left

## 2017-08-03 MED ORDER — PROPOFOL 500 MG/50ML IV EMUL
INTRAVENOUS | Status: DC | PRN
  Administered 2017-08-03: 150 ug/kg/min via INTRAVENOUS

## 2017-08-03 MED ORDER — LACTATED RINGERS IV SOLN
INTRAVENOUS | Status: DC
  Administered 2017-08-03: 10:00:00 via INTRAVENOUS

## 2017-08-03 MED ORDER — FENTANYL CITRATE (PF) 100 MCG/2ML IJ SOLN: 25.0000 ug | INTRAMUSCULAR | Status: DC | PRN

## 2017-08-03 MED ORDER — ROCURONIUM BROMIDE 10 MG/ML (PF) SYRINGE
PREFILLED_SYRINGE | INTRAVENOUS | Status: DC | PRN
  Administered 2017-08-03: 40 mg via INTRAVENOUS

## 2017-08-03 MED ORDER — MIDAZOLAM HCL 5 MG/5ML IJ SOLN
INTRAMUSCULAR | Status: DC | PRN
  Administered 2017-08-03: 2 mg via INTRAVENOUS

## 2017-08-03 MED ORDER — FENTANYL CITRATE (PF) 100 MCG/2ML IJ SOLN
INTRAMUSCULAR | Status: DC | PRN
  Administered 2017-08-03: 100 ug via INTRAVENOUS

## 2017-08-03 MED ORDER — LIDOCAINE HCL (CARDIAC) 20 MG/ML IV SOLN
INTRAVENOUS | Status: DC | PRN
  Administered 2017-08-03: 60 mg via INTRAVENOUS

## 2017-08-03 MED ORDER — FENTANYL CITRATE (PF) 250 MCG/5ML IJ SOLN
INTRAMUSCULAR | Status: AC
  Filled 2017-08-03: qty 5

## 2017-08-03 MED ORDER — PROPOFOL 10 MG/ML IV BOLUS
INTRAVENOUS | Status: DC | PRN
  Administered 2017-08-03: 50 mg via INTRAVENOUS
  Administered 2017-08-03: 100 mg via INTRAVENOUS

## 2017-08-03 MED ORDER — PROPOFOL 10 MG/ML IV BOLUS
INTRAVENOUS | Status: AC
  Filled 2017-08-03: qty 20

## 2017-08-03 MED ORDER — ONDANSETRON HCL 4 MG/2ML IJ SOLN
INTRAMUSCULAR | Status: DC | PRN
  Administered 2017-08-03: 4 mg via INTRAVENOUS

## 2017-08-03 MED ORDER — ONDANSETRON HCL 4 MG/2ML IJ SOLN: 4.0000 mg | Freq: Once | INTRAMUSCULAR | Status: DC | PRN

## 2017-08-03 MED ORDER — MIDAZOLAM HCL 2 MG/2ML IJ SOLN
INTRAMUSCULAR | Status: AC
  Filled 2017-08-03: qty 2

## 2017-08-03 MED ORDER — EPINEPHRINE HCL (NASAL) 0.1 % NA SOLN
NASAL | Status: AC
  Filled 2017-08-03: qty 30

## 2017-08-03 MED ORDER — SUGAMMADEX SODIUM 200 MG/2ML IV SOLN
INTRAVENOUS | Status: DC | PRN
  Administered 2017-08-03: 262 mg via INTRAVENOUS

## 2017-08-03 MED ORDER — 0.9 % SODIUM CHLORIDE (POUR BTL) OPTIME
TOPICAL | Status: DC | PRN
  Administered 2017-08-03: 1000 mL

## 2017-08-03 MED ORDER — PHENYLEPHRINE 40 MCG/ML (10ML) SYRINGE FOR IV PUSH (FOR BLOOD PRESSURE SUPPORT)
PREFILLED_SYRINGE | INTRAVENOUS | Status: DC | PRN
  Administered 2017-08-03: 40 ug via INTRAVENOUS
  Administered 2017-08-03 (×2): 80 ug via INTRAVENOUS

## 2017-08-03 MED ORDER — DEXAMETHASONE SODIUM PHOSPHATE 10 MG/ML IJ SOLN
INTRAMUSCULAR | Status: DC | PRN
  Administered 2017-08-03: 10 mg via INTRAVENOUS

## 2017-08-03 SURGICAL SUPPLY — 16 items
CANISTER SUCT 3000ML PPV (MISCELLANEOUS) ×3 IMPLANT
COVER BACK TABLE 60X90IN (DRAPES) ×3 IMPLANT
CRADLE DONUT ADULT HEAD (MISCELLANEOUS) ×2 IMPLANT
DRAPE HALF SHEET 40X57 (DRAPES) ×3 IMPLANT
GLOVE SS BIOGEL STRL SZ 7.5 (GLOVE) ×1 IMPLANT
GLOVE SUPERSENSE BIOGEL SZ 7.5 (GLOVE) ×2
GUARD TEETH (MISCELLANEOUS) ×2 IMPLANT
KIT BASIN OR (CUSTOM PROCEDURE TRAY) IMPLANT
KIT PROLARN PLUS GEL W/NDL (Prosthesis and Implant ENT) ×3 IMPLANT
KIT ROOM TURNOVER OR (KITS) ×3 IMPLANT
NS IRRIG 1000ML POUR BTL (IV SOLUTION) ×3 IMPLANT
PAD ARMBOARD 7.5X6 YLW CONV (MISCELLANEOUS) ×4 IMPLANT
PATTIES SURGICAL .5 X1 (DISPOSABLE) IMPLANT
TOWEL OR 17X24 6PK STRL BLUE (TOWEL DISPOSABLE) ×6 IMPLANT
TUBE CONNECTING 12'X1/4 (SUCTIONS) ×1
TUBE CONNECTING 12X1/4 (SUCTIONS) ×2 IMPLANT

## 2017-08-03 NOTE — Anesthesia Procedure Notes (Signed)
Procedure Name: Intubation Date/Time: 08/03/2017 12:57 PM Performed by: Merrilyn Puma B Pre-anesthesia Checklist: Patient identified, Emergency Drugs available, Suction available, Patient being monitored and Timeout performed Patient Re-evaluated:Patient Re-evaluated prior to induction Oxygen Delivery Method: Circle system utilized Preoxygenation: Pre-oxygenation with 100% oxygen Induction Type: IV induction and Cricoid Pressure applied Laryngoscope Size: Mac and 3 Grade View: Grade III Tube type: Oral Tube size: 6.0 mm Number of attempts: 2 Airway Equipment and Method: Stylet Placement Confirmation: ETT inserted through vocal cords under direct vision,  positive ETCO2,  CO2 detector and breath sounds checked- equal and bilateral Secured at: 21 cm Tube secured with: Tape Dental Injury: Teeth and Oropharynx as per pre-operative assessment  Comments: Pt has very limited neck mobility.  DLx1 grade 4 view - esophageal intubation. DLx1 with cricoid pressure - grade 3 view.

## 2017-08-03 NOTE — Transfer of Care (Signed)
Immediate Anesthesia Transfer of Care Note  Patient: Karen Vasquez  Procedure(s) Performed: Procedure(s): MICROLARYNGOSCOPY WITH PROLARYN VOCAL CORD INJECTION (Left)  Patient Location: PACU  Anesthesia Type:General  Level of Consciousness: awake, alert  and oriented  Airway & Oxygen Therapy: Patient Spontanous Breathing and Patient connected to nasal cannula oxygen  Post-op Assessment: Report given to RN, Post -op Vital signs reviewed and stable and Patient moving all extremities X 4  Post vital signs: Reviewed and stable  Last Vitals:  Vitals:   08/03/17 1012  BP: (!) 122/57  Pulse: 90  Resp: 19  Temp: 37.1 C  SpO2: 98%    Last Pain:  Vitals:   08/03/17 1019  TempSrc:   PainSc: 2          Complications: No apparent anesthesia complications

## 2017-08-03 NOTE — Anesthesia Preprocedure Evaluation (Addendum)
Anesthesia Evaluation  Patient identified by MRN, date of birth, ID band Patient awake    Reviewed: Allergy & Precautions, H&P , NPO status , Patient's Chart, lab work & pertinent test results  Airway Mallampati: II  TM Distance: >3 FB Neck ROM: Limited    Dental  (+) Teeth Intact, Dental Advisory Given   Pulmonary  Lung CA s/p chemo and radiation   breath sounds clear to auscultation       Cardiovascular  Rhythm:Regular Rate:Normal  ECG: NSR, rate 82, LAD   Neuro/Psych TIA   GI/Hepatic GERD  Medicated and Controlled,  Endo/Other    Renal/GU      Musculoskeletal  (+) Arthritis , Osteoarthritis,  S/p c-spine surgery   Abdominal   Peds  Hematology  (+) anemia ,   Anesthesia Other Findings vocal cord paralysis Hoarseness   Reproductive/Obstetrics                            Anesthesia Physical  Anesthesia Plan  ASA: II  Anesthesia Plan: General   Post-op Pain Management:    Induction: Intravenous  PONV Risk Score and Plan: 3 and Ondansetron, Dexamethasone, Midazolam and Propofol infusion  Airway Management Planned: Oral ETT  Additional Equipment:   Intra-op Plan:   Post-operative Plan: Extubation in OR  Informed Consent: I have reviewed the patients History and Physical, chart, labs and discussed the procedure including the risks, benefits and alternatives for the proposed anesthesia with the patient or authorized representative who has indicated his/her understanding and acceptance.   Dental advisory given  Plan Discussed with: CRNA  Anesthesia Plan Comments: (Small ETT )       Anesthesia Quick Evaluation

## 2017-08-03 NOTE — H&P (Signed)
Karen Vasquez is an 66 y.o. female.   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 Adenocarcinoma of left lung, stage 4 ([place]) 12/16/2016  . Arthritis   . Asthma due to seasonal allergies   . Chronic fatigue 02/23/2017  . Encounter for antineoplastic chemotherapy 12/17/2016  . GERD (gastroesophageal reflux disease)   . Goals of care, counseling/discussion 12/17/2016  . Hernia of abdominal wall    umbilical  . Hypertension 01/09/2017  . No pertinent past medical history    COLD UPPER RESP STARTED [facility] 6/14  . Recurrent upper respiratory infection (URI)    recent sinus inf  . Sinus infection 02/25/2017  . Thyroid nodule   . Urine blood    hematuria    Past Surgical History:  Procedure Laterality Date  . ABDOMINAL HYSTERECTOMY  08   vag, rectocele repair , bladder sling  . ANTERIOR CERVICAL DECOMP/DISCECTOMY FUSION  05/09/2012   Procedure: ANTERIOR CERVICAL DECOMPRESSION/DISCECTOMY FUSION 1 LEVEL;  Surgeon: Eustace Moore, MD;  Location: [facility];  Service: Neurosurgery;  Laterality: N/A;  anterior cervical decompression fusion cervical five-six  . BREAST CYST EXCISION  96   fibroadenoma lft  . BREAST SURGERY     03  . CARPAL TUNNEL RELEASE  05   bil  . DILATION AND CURETTAGE OF UTERUS  03    polyps  . EYE SURGERY     cataracts bilat; left eye surgery   . IR FLUORO GUIDE PORT INSERTION RIGHT  05/09/2017  . IR US GUIDE VASC ACCESS RIGHT  05/09/2017  . TUBAL LIGATION  83    Family History  Problem Relation Age of Onset  . Cancer Mother        breast  . Cancer Father        prostate  . Muscular dystrophy Son    Social History:  reports that she has never smoked. She has never used smokeless tobacco. She reports that she does not drink alcohol or use drugs.  Allergies:  Allergies  Allergen Reactions  . Prednisone Other (See Comments)    "Steroids give me chest pain"  . Fenofibrate Other (See Comments)     Muscle aches  . Quinolones Other (See Comments)    Wants to avoid due to possibility of tendon rupture.   . Statins Other (See Comments)    Muscle pain   . Septra [Sulfamethoxazole-Trimethoprim] Rash    Medications Prior to Admission  Medication Sig Dispense Refill  . aspirin EC 81 MG tablet Take 81 mg by mouth daily.    . calcium gluconate 500 MG tablet Take 500 mg by mouth 2 (two) times daily.     . Cholecalciferol (VITAMIN D) 2000 units tablet Take 2,000 Units by mouth at bedtime.     . conjugated estrogens (PREMARIN) vaginal cream Place 1 Applicatorful vaginally daily.    . Cranberry 500 MG TABS Take 500 mg by mouth daily.     Marland Kitchen ezetimibe (ZETIA) 10 MG tablet Take 10 mg by mouth at bedtime.     Marland Kitchen ketotifen (ZADITOR) 0.025 % ophthalmic solution Place 1 drop into both eyes 2 (two) times daily.    Marland Kitchen lidocaine-prilocaine (EMLA) cream Apply 1 application topically as needed. Apply 1-2 tsp 1-2 hours prior to labs or chemotherapy 30 g 0  . loratadine (CLARITIN) 10 MG tablet Take 10 mg by mouth at bedtime.     Marland Kitchen LORazepam (ATIVAN) 0.5  MG tablet Take 1 tablet (0.5 mg total) by mouth every 8 (eight) hours. (Patient taking differently: Take 0.5 mg by mouth every 8 (eight) hours as needed (nausea). ) 30 tablet 0  . Multiple Vitamin (MULTIVITAMIN) tablet Take 1 tablet by mouth daily.    . nitrofurantoin (MACRODANTIN) 100 MG capsule Take 100 mg by mouth 2 (two) times daily.    Marland Kitchen oxyCODONE (OXY IR/ROXICODONE) 5 MG immediate release tablet Take 1-3 tablets (5-15 mg total) by mouth every 4 (four) hours as needed for severe pain. 100 tablet 0  . pantoprazole (PROTONIX) 40 MG tablet Take 40 mg by mouth 2 (two) times daily before a meal.     . raloxifene (EVISTA) 60 MG tablet Take 60 mg by mouth daily.    . temazepam (RESTORIL) 30 MG capsule Take 1 capsule (30 mg total) by mouth at bedtime as needed for sleep. 30 capsule 0  . trimethoprim (TRIMPEX) 100 MG tablet Take 100 mg by mouth at bedtime.     Marland Kitchen  VASCEPA 1 g CAPS Take 2 g by mouth 2 (two) times daily.     . vitamin C (ASCORBIC ACID) 250 MG tablet Take 250 mg by mouth daily.    Marland Kitchen LORazepam (ATIVAN) 0.5 MG tablet 1 tablet po 30 minutes prior to radiation or MRI 30 tablet 0  . ondansetron (ZOFRAN) 8 MG tablet Take 1 tablet (8 mg total) by mouth every 8 (eight) hours as needed for nausea or vomiting. (Patient not taking: Reported on 06/15/2017) 30 tablet 0  . prochlorperazine (COMPAZINE) 10 MG tablet TAKE ONE TABLET BY MOUTH EVERY 6 HOURS AS NEEDED FOR NAUSEA/VOMITING (Patient not taking: Reported on 06/15/2017) 30 tablet 0  . sodium chloride (OCEAN) 0.65 % nasal spray Place 1 spray into the nose daily as needed for congestion.       No results found for this or any previous visit (from the past 48 hour(s)). No results found.  Review of Systems  Constitutional: Negative.   HENT: Negative.   Eyes: Negative.   Respiratory: Negative.   Cardiovascular: Negative.   Skin: Negative.     Blood pressure (!) 122/57, pulse 90, temperature 98.7 F (37.1 C), temperature source Oral, resp. rate 19, SpO2 98 %. Physical Exam  Constitutional: She appears well-developed and well-nourished.  HENT:  Head: Normocephalic and atraumatic.  Nose: Nose normal.  Eyes: Pupils are equal, round, and reactive to light. Conjunctivae are normal.  Neck: Normal range of motion. Neck supple.  Cardiovascular: Normal rate.   Respiratory: Effort normal.  GI: Soft.  Musculoskeletal: Normal range of motion.     Assessment/Plan Left TVC paralysis- she is struggling and ready for injection. We discussed the procedure/  Melissa Montane, MD 08/03/2017, 12:39 PM

## 2017-08-03 NOTE — Op Note (Signed)
Preop/postop diagnosis: Left vocal cord paralysis Procedure: Prolarynx injection of hydroxyapetite with direct microlaryngoscopy Anesthesia: Gen. 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 She is under the care of oncology. She has struggled with her very low volume voice and difficulty with swallowing and wants to do something about the cord. She's elected to have an injection. She does have a neck fusion so her direct laryngoscopy may be difficult. She was informed risks and benefits of the procedure and options were discussed all questions are answered and consent was obtained. Procedure: Patient is taken the operating placed in supine position after general endotracheal tube anesthesia was placed in a neutral position as her neck does not extend at all. The anterior commissure scope was inserted and the epiglottis was visualized and then placed underneath the epiglottis the posterior aspect of the larynx could be visualized but not the anterior glottis. The patient then had laryngeal pressure and the vocal cords came into view with this pressure. Piece of tape was placed over the neck to hold this in position but is still required manipulation. The left cord posterior two thirds could be visualized and laterally the injection was performed. 0.2 mL was injected. This appeared to plump the cord up well but the endotracheal tube was not removed given the difficulty of her airway. He did move the cord further toward midline. The cords both of him the posterior aspect look normal. The residual material that came out of the injection site was suctioned off. There was no bleeding. The scope was removed. Patient was awake and brought to cover stable condition counts correct

## 2017-08-03 NOTE — Progress Notes (Signed)
Care of pt assumed by MA Shelba Susi RN 

## 2017-08-04 ENCOUNTER — Encounter (HOSPITAL_COMMUNITY): Payer: Self-pay | Admitting: Otolaryngology

## 2017-08-04 ENCOUNTER — Telehealth: Payer: Self-pay | Admitting: Radiation Oncology

## 2017-08-04 NOTE — Telephone Encounter (Signed)
Ms. Bachicha just called saying that she wants to cancel her brain MRI that was set up for her since she is enrolling in a clinical trial at [facility]. Gave this information to Manuela Schwartz to cancel with GI.

## 2017-08-04 NOTE — Anesthesia Postprocedure Evaluation (Signed)
Anesthesia Post Note  Patient: Karen Vasquez  Procedure(s) Performed: Procedure(s) (LRB): MICROLARYNGOSCOPY WITH PROLARYN VOCAL CORD INJECTION (Left)     Patient location during evaluation: PACU Anesthesia Type: General Level of consciousness: awake and alert Pain management: pain level controlled Vital Signs Assessment: post-procedure vital signs reviewed and stable Respiratory status: spontaneous breathing, nonlabored ventilation, respiratory function stable and patient connected to nasal cannula oxygen Cardiovascular status: blood pressure returned to baseline and stable Postop Assessment: no apparent nausea or vomiting Anesthetic complications: no    Last Vitals:  Vitals:   08/03/17 1354 08/03/17 1400  BP: 115/65 117/85  Pulse: 81 78  Resp: 18 18  Temp: (!) 36.1 C (!) 36.1 C  SpO2: 96% 95%    Last Pain:  Vitals:   08/03/17 1400  TempSrc:   PainSc: 0-No pain                 Ryan P Ellender

## 2017-08-07 ENCOUNTER — Other Ambulatory Visit: Payer: BLUE CROSS/BLUE SHIELD

## 2017-08-08 ENCOUNTER — Encounter (HOSPITAL_COMMUNITY): Payer: Self-pay | Admitting: Otolaryngology

## 2017-08-08 ENCOUNTER — Other Ambulatory Visit: Payer: BLUE CROSS/BLUE SHIELD

## 2017-08-09 ENCOUNTER — Ambulatory Visit: Payer: Self-pay | Admitting: Urology

## 2017-08-11 ENCOUNTER — Encounter: Payer: Self-pay | Admitting: Radiation Therapy

## 2017-08-14 DIAGNOSIS — Z006 Encounter for examination for normal comparison and control in clinical research program: Secondary | ICD-10-CM | POA: Diagnosis not present

## 2017-08-14 DIAGNOSIS — C7970 Secondary malignant neoplasm of unspecified adrenal gland: Secondary | ICD-10-CM | POA: Diagnosis not present

## 2017-08-14 DIAGNOSIS — C3492 Malignant neoplasm of unspecified part of left bronchus or lung: Secondary | ICD-10-CM | POA: Diagnosis not present

## 2017-08-17 ENCOUNTER — Ambulatory Visit: Payer: BLUE CROSS/BLUE SHIELD

## 2017-08-17 ENCOUNTER — Other Ambulatory Visit: Payer: BLUE CROSS/BLUE SHIELD

## 2017-08-17 ENCOUNTER — Ambulatory Visit: Payer: BLUE CROSS/BLUE SHIELD | Admitting: Internal Medicine

## 2017-08-17 DIAGNOSIS — C3492 Malignant neoplasm of unspecified part of left bronchus or lung: Secondary | ICD-10-CM | POA: Diagnosis not present

## 2017-08-17 DIAGNOSIS — R52 Pain, unspecified: Secondary | ICD-10-CM | POA: Diagnosis not present

## 2017-08-17 DIAGNOSIS — R300 Dysuria: Secondary | ICD-10-CM | POA: Diagnosis not present

## 2017-08-17 DIAGNOSIS — G4700 Insomnia, unspecified: Secondary | ICD-10-CM | POA: Diagnosis not present

## 2017-08-17 DIAGNOSIS — Z9221 Personal history of antineoplastic chemotherapy: Secondary | ICD-10-CM | POA: Diagnosis not present

## 2017-08-17 DIAGNOSIS — Z5112 Encounter for antineoplastic immunotherapy: Secondary | ICD-10-CM | POA: Diagnosis not present

## 2017-08-17 DIAGNOSIS — Z87898 Personal history of other specified conditions: Secondary | ICD-10-CM | POA: Diagnosis not present

## 2017-08-17 DIAGNOSIS — C7931 Secondary malignant neoplasm of brain: Secondary | ICD-10-CM | POA: Diagnosis not present

## 2017-08-17 DIAGNOSIS — Z006 Encounter for examination for normal comparison and control in clinical research program: Secondary | ICD-10-CM | POA: Diagnosis not present

## 2017-08-17 DIAGNOSIS — R110 Nausea: Secondary | ICD-10-CM | POA: Diagnosis not present

## 2017-08-17 DIAGNOSIS — C7971 Secondary malignant neoplasm of right adrenal gland: Secondary | ICD-10-CM | POA: Diagnosis not present

## 2017-08-17 DIAGNOSIS — Z923 Personal history of irradiation: Secondary | ICD-10-CM | POA: Diagnosis not present

## 2017-08-26 DIAGNOSIS — Z23 Encounter for immunization: Secondary | ICD-10-CM | POA: Diagnosis not present

## 2017-08-31 DIAGNOSIS — Z006 Encounter for examination for normal comparison and control in clinical research program: Secondary | ICD-10-CM | POA: Diagnosis not present

## 2017-08-31 DIAGNOSIS — C7970 Secondary malignant neoplasm of unspecified adrenal gland: Secondary | ICD-10-CM | POA: Diagnosis not present

## 2017-08-31 DIAGNOSIS — C3492 Malignant neoplasm of unspecified part of left bronchus or lung: Secondary | ICD-10-CM | POA: Diagnosis not present

## 2017-08-31 DIAGNOSIS — R110 Nausea: Secondary | ICD-10-CM | POA: Diagnosis not present

## 2017-08-31 DIAGNOSIS — R52 Pain, unspecified: Secondary | ICD-10-CM | POA: Diagnosis not present

## 2017-08-31 DIAGNOSIS — C7931 Secondary malignant neoplasm of brain: Secondary | ICD-10-CM | POA: Diagnosis not present

## 2017-08-31 DIAGNOSIS — G4700 Insomnia, unspecified: Secondary | ICD-10-CM | POA: Diagnosis not present

## 2017-08-31 DIAGNOSIS — D72829 Elevated white blood cell count, unspecified: Secondary | ICD-10-CM | POA: Diagnosis not present

## 2017-08-31 DIAGNOSIS — R079 Chest pain, unspecified: Secondary | ICD-10-CM | POA: Diagnosis not present

## 2017-08-31 DIAGNOSIS — Z9221 Personal history of antineoplastic chemotherapy: Secondary | ICD-10-CM | POA: Diagnosis not present

## 2017-08-31 DIAGNOSIS — C7971 Secondary malignant neoplasm of right adrenal gland: Secondary | ICD-10-CM | POA: Diagnosis not present

## 2017-09-14 DIAGNOSIS — D72829 Elevated white blood cell count, unspecified: Secondary | ICD-10-CM | POA: Diagnosis not present

## 2017-09-14 DIAGNOSIS — R634 Abnormal weight loss: Secondary | ICD-10-CM | POA: Diagnosis not present

## 2017-09-14 DIAGNOSIS — R52 Pain, unspecified: Secondary | ICD-10-CM | POA: Diagnosis not present

## 2017-09-14 DIAGNOSIS — Z006 Encounter for examination for normal comparison and control in clinical research program: Secondary | ICD-10-CM | POA: Diagnosis not present

## 2017-09-14 DIAGNOSIS — G4700 Insomnia, unspecified: Secondary | ICD-10-CM | POA: Diagnosis not present

## 2017-09-14 DIAGNOSIS — Z5112 Encounter for antineoplastic immunotherapy: Secondary | ICD-10-CM | POA: Diagnosis not present

## 2017-09-14 DIAGNOSIS — K5900 Constipation, unspecified: Secondary | ICD-10-CM | POA: Diagnosis not present

## 2017-09-14 DIAGNOSIS — C7970 Secondary malignant neoplasm of unspecified adrenal gland: Secondary | ICD-10-CM | POA: Diagnosis not present

## 2017-09-14 DIAGNOSIS — R630 Anorexia: Secondary | ICD-10-CM | POA: Diagnosis not present

## 2017-09-14 DIAGNOSIS — C3492 Malignant neoplasm of unspecified part of left bronchus or lung: Secondary | ICD-10-CM | POA: Diagnosis not present

## 2017-09-14 DIAGNOSIS — R110 Nausea: Secondary | ICD-10-CM | POA: Diagnosis not present

## 2017-10-02 DIAGNOSIS — Z006 Encounter for examination for normal comparison and control in clinical research program: Secondary | ICD-10-CM | POA: Diagnosis not present

## 2017-10-02 DIAGNOSIS — C3492 Malignant neoplasm of unspecified part of left bronchus or lung: Secondary | ICD-10-CM | POA: Diagnosis not present

## 2017-10-02 DIAGNOSIS — C7970 Secondary malignant neoplasm of unspecified adrenal gland: Secondary | ICD-10-CM | POA: Diagnosis not present

## 2017-10-02 DIAGNOSIS — K769 Liver disease, unspecified: Secondary | ICD-10-CM | POA: Diagnosis not present

## 2017-10-02 DIAGNOSIS — C7951 Secondary malignant neoplasm of bone: Secondary | ICD-10-CM | POA: Diagnosis not present

## 2017-10-02 DIAGNOSIS — R591 Generalized enlarged lymph nodes: Secondary | ICD-10-CM | POA: Diagnosis not present

## 2017-10-04 DIAGNOSIS — E118 Type 2 diabetes mellitus with unspecified complications: Secondary | ICD-10-CM | POA: Diagnosis not present

## 2017-10-04 DIAGNOSIS — I10 Essential (primary) hypertension: Secondary | ICD-10-CM | POA: Diagnosis not present

## 2017-10-11 DIAGNOSIS — Z79899 Other long term (current) drug therapy: Secondary | ICD-10-CM | POA: Diagnosis not present

## 2017-10-11 DIAGNOSIS — Z0000 Encounter for general adult medical examination without abnormal findings: Secondary | ICD-10-CM | POA: Diagnosis not present

## 2017-10-11 DIAGNOSIS — E118 Type 2 diabetes mellitus with unspecified complications: Secondary | ICD-10-CM | POA: Diagnosis not present

## 2017-10-11 DIAGNOSIS — E559 Vitamin D deficiency, unspecified: Secondary | ICD-10-CM | POA: Diagnosis not present

## 2017-10-12 DIAGNOSIS — C7931 Secondary malignant neoplasm of brain: Secondary | ICD-10-CM | POA: Diagnosis not present

## 2017-10-12 DIAGNOSIS — Z006 Encounter for examination for normal comparison and control in clinical research program: Secondary | ICD-10-CM | POA: Diagnosis not present

## 2017-10-12 DIAGNOSIS — C7970 Secondary malignant neoplasm of unspecified adrenal gland: Secondary | ICD-10-CM | POA: Diagnosis not present

## 2017-10-12 DIAGNOSIS — Z5112 Encounter for antineoplastic immunotherapy: Secondary | ICD-10-CM | POA: Diagnosis not present

## 2017-10-12 DIAGNOSIS — G893 Neoplasm related pain (acute) (chronic): Secondary | ICD-10-CM | POA: Diagnosis not present

## 2017-10-12 DIAGNOSIS — C3492 Malignant neoplasm of unspecified part of left bronchus or lung: Secondary | ICD-10-CM | POA: Diagnosis not present

## 2017-10-12 DIAGNOSIS — R110 Nausea: Secondary | ICD-10-CM | POA: Diagnosis not present

## 2017-10-12 DIAGNOSIS — R630 Anorexia: Secondary | ICD-10-CM | POA: Diagnosis not present

## 2017-11-01 ENCOUNTER — Other Ambulatory Visit: Payer: Self-pay

## 2017-11-01 ENCOUNTER — Inpatient Hospital Stay (HOSPITAL_COMMUNITY)
Admission: EM | Admit: 2017-11-01 | Discharge: 2017-11-06 | DRG: 872 | Disposition: A | Discharge status: Home / Self Care | Payer: BLUE CROSS/BLUE SHIELD | Attending: Internal Medicine | Admitting: Internal Medicine

## 2017-11-01 ENCOUNTER — Encounter (HOSPITAL_COMMUNITY): Payer: Self-pay

## 2017-11-01 DIAGNOSIS — D649 Anemia, unspecified: Secondary | ICD-10-CM | POA: Diagnosis not present

## 2017-11-01 DIAGNOSIS — E440 Moderate protein-calorie malnutrition: Secondary | ICD-10-CM

## 2017-11-01 DIAGNOSIS — G894 Chronic pain syndrome: Secondary | ICD-10-CM | POA: Diagnosis not present

## 2017-11-01 DIAGNOSIS — K219 Gastro-esophageal reflux disease without esophagitis: Secondary | ICD-10-CM | POA: Diagnosis not present

## 2017-11-01 DIAGNOSIS — R112 Nausea with vomiting, unspecified: Secondary | ICD-10-CM | POA: Diagnosis not present

## 2017-11-01 DIAGNOSIS — C3492 Malignant neoplasm of unspecified part of left bronchus or lung: Secondary | ICD-10-CM | POA: Diagnosis present

## 2017-11-01 DIAGNOSIS — A419 Sepsis, unspecified organism: Principal | ICD-10-CM

## 2017-11-01 DIAGNOSIS — E785 Hyperlipidemia, unspecified: Secondary | ICD-10-CM | POA: Diagnosis present

## 2017-11-01 DIAGNOSIS — E860 Dehydration: Secondary | ICD-10-CM | POA: Diagnosis present

## 2017-11-01 DIAGNOSIS — D72829 Elevated white blood cell count, unspecified: Secondary | ICD-10-CM

## 2017-11-01 DIAGNOSIS — N3000 Acute cystitis without hematuria: Secondary | ICD-10-CM | POA: Diagnosis not present

## 2017-11-01 DIAGNOSIS — Z7982 Long term (current) use of aspirin: Secondary | ICD-10-CM | POA: Diagnosis not present

## 2017-11-01 DIAGNOSIS — Z881 Allergy status to other antibiotic agents status: Secondary | ICD-10-CM

## 2017-11-01 DIAGNOSIS — Z79899 Other long term (current) drug therapy: Secondary | ICD-10-CM

## 2017-11-01 DIAGNOSIS — R197 Diarrhea, unspecified: Secondary | ICD-10-CM | POA: Diagnosis not present

## 2017-11-01 DIAGNOSIS — Z79891 Long term (current) use of opiate analgesic: Secondary | ICD-10-CM

## 2017-11-01 DIAGNOSIS — N390 Urinary tract infection, site not specified: Secondary | ICD-10-CM | POA: Diagnosis present

## 2017-11-01 DIAGNOSIS — Z888 Allergy status to other drugs, medicaments and biological substances status: Secondary | ICD-10-CM | POA: Diagnosis not present

## 2017-11-01 DIAGNOSIS — R9431 Abnormal electrocardiogram [ECG] [EKG]: Secondary | ICD-10-CM | POA: Diagnosis not present

## 2017-11-01 DIAGNOSIS — Z6824 Body mass index (BMI) 24.0-24.9, adult: Secondary | ICD-10-CM

## 2017-11-01 LAB — COMPREHENSIVE METABOLIC PANEL
ALBUMIN: 3.2 g/dL — AB (ref 3.5–5.0)
ALK PHOS: 121 U/L (ref 38–126)
ALT: 8 U/L — ABNORMAL LOW (ref 14–54)
AST: 18 U/L (ref 15–41)
Anion gap: 10 (ref 5–15)
BILIRUBIN TOTAL: 0.4 mg/dL (ref 0.3–1.2)
BUN: 14 mg/dL (ref 6–20)
CALCIUM: 8.9 mg/dL (ref 8.9–10.3)
CO2: 22 mmol/L (ref 22–32)
Chloride: 102 mmol/L (ref 101–111)
Creatinine, Ser: 1.03 mg/dL — ABNORMAL HIGH (ref 0.44–1.00)
GFR calc Af Amer: >60 mL/min (ref >60)
GFR calc non Af Amer: 55 mL/min — ABNORMAL LOW (ref >60)
GLUCOSE: 98 mg/dL (ref 65–99)
Potassium: 3.8 mmol/L (ref 3.5–5.1)
Sodium: 134 mmol/L — ABNORMAL LOW (ref 135–145)
TOTAL PROTEIN: 6.7 g/dL (ref 6.5–8.1)

## 2017-11-01 LAB — CBC WITH DIFFERENTIAL/PLATELET
Basophils Absolute: 0 10*3/uL (ref 0.0–0.1)
Basophils Relative: 0 %
Eosinophils Absolute: 2.3 10*3/uL — ABNORMAL HIGH (ref 0.0–0.7)
Eosinophils Relative: 9 %
HCT: 29.8 % — ABNORMAL LOW (ref 36.0–46.0)
Hemoglobin: 9.7 g/dL — ABNORMAL LOW (ref 12.0–15.0)
Lymphocytes Relative: 8 %
Lymphs Abs: 2 10*3/uL (ref 0.7–4.0)
MCH: 27.4 pg (ref 26.0–34.0)
MCHC: 32.6 g/dL (ref 30.0–36.0)
MCV: 84.2 fL (ref 78.0–100.0)
Monocytes Absolute: 1.8 10*3/uL — ABNORMAL HIGH (ref 0.1–1.0)
Monocytes Relative: 8 %
Neutro Abs: 18.3 10*3/uL — ABNORMAL HIGH (ref 1.7–7.7)
Neutrophils Relative %: 75 %
Platelets: 388 10*3/uL (ref 150–400)
RBC: 3.54 MIL/uL — ABNORMAL LOW (ref 3.87–5.11)
RDW: 16 % — ABNORMAL HIGH (ref 11.5–15.5)
WBC: 24.5 10*3/uL — ABNORMAL HIGH (ref 4.0–10.5)

## 2017-11-01 LAB — LIPASE, BLOOD: Lipase: 23 U/L (ref 11–51)

## 2017-11-01 LAB — MAGNESIUM: Magnesium: 1.6 mg/dL — ABNORMAL LOW (ref 1.7–2.4)

## 2017-11-01 MED ORDER — OXYCODONE HCL ER 10 MG PO T12A
20.0000 mg | EXTENDED_RELEASE_TABLET | Freq: Once | ORAL | Status: AC
  Administered 2017-11-01: 20 mg via ORAL
  Filled 2017-11-01: qty 2

## 2017-11-01 MED ORDER — PROMETHAZINE HCL 25 MG/ML IJ SOLN
12.5000 mg | Freq: Once | INTRAMUSCULAR | Status: AC
  Administered 2017-11-01: 12.5 mg via INTRAVENOUS
  Filled 2017-11-01: qty 1

## 2017-11-01 MED ORDER — MAGNESIUM SULFATE 2 GM/50ML IV SOLN
2.0000 g | Freq: Once | INTRAVENOUS | Status: AC
  Administered 2017-11-01: 2 g via INTRAVENOUS
  Filled 2017-11-01: qty 50

## 2017-11-01 MED ORDER — SODIUM CHLORIDE 0.9 % IV BOLUS (SEPSIS): 500.0000 mL | Freq: Once | INTRAVENOUS | Status: DC

## 2017-11-01 MED ORDER — SODIUM CHLORIDE 0.9 % IV BOLUS (SEPSIS)
1000.0000 mL | Freq: Once | INTRAVENOUS | Status: AC
  Administered 2017-11-01: 1000 mL via INTRAVENOUS

## 2017-11-01 MED ORDER — LACTATED RINGERS IV BOLUS (SEPSIS)
1000.0000 mL | Freq: Once | INTRAVENOUS | Status: AC
  Administered 2017-11-01: 1000 mL via INTRAVENOUS

## 2017-11-01 NOTE — ED Triage Notes (Signed)
Patient c/o occasional abdominal pain and states she vomited x 2 yesterday. Patient is currently receiving chemo for stage 4 lung cancer

## 2017-11-01 NOTE — ED Notes (Addendum)
Pt aware of need for urine specimen, but cannot provide one at this time. Will ask again when more fluids run. RN aware.

## 2017-11-01 NOTE — ED Notes (Signed)
Patient requesting nausea medicine. PA made aware.

## 2017-11-01 NOTE — ED Provider Notes (Signed)
[facility] DEPT Provider Note   CSN: [id number] Arrival date & time: 11/01/17  1751     History   Chief Complaint Chief Complaint  Patient presents with  . Emesis  . Abdominal Pain  . chemo patient    HPI Karen Vasquez is a 66 y.o. 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  She has had ongoing nausea and vomiting for the past month or so with worsening over the past 2 days.  She notes nausea is persistent, and she had 2 episodes of nonbloody nonbilious emesis yesterday.  She notes intermittent mild aching of the middle of the abdomen which does not radiate, worsens after meals.  She notes decreased appetite but this has been ongoing for several weeks. She is a patient of Dr. Aniceto Boss at [facility] undergoing clinical trial.  She received chemotherapy every 4 weeks and is scheduled to follow-up next week.  She has had ongoing nausea and vomiting after starting chemotherapy.  She is on Zofran, Compazine, and olanzapine for her nausea which she states is not helpful.  She also notes decreased appetite and anorexia. Denies fevers or chills.  No worsening of her chronic chest pain shortness of breath. Denies urinary symptoms but states she is producing less urine secondary to decreased oral intake.  Has had one episode of watery nonbloody stools.  The history is provided by the patient.    Past Medical History:  Diagnosis Date  . Adenocarcinoma of left lung, stage 4 ([place]) 12/16/2016  . Arthritis   . Asthma due to seasonal allergies   . Chronic fatigue 02/23/2017  . Encounter for antineoplastic chemotherapy 12/17/2016  . GERD (gastroesophageal reflux disease)   . Goals of care, counseling/discussion 12/17/2016  . Hernia of abdominal wall    umbilical  . No pertinent past medical history    COLD UPPER RESP STARTED ZPAK 6/14  . Recurrent upper respiratory infection (URI)    recent sinus inf  . Sinus infection 02/25/2017  . Thyroid nodule   . Urine blood    hematuria    Patient Active Problem List   Diagnosis Date Noted  . Port catheter in place 07/06/2017  . Sinus infection 02/25/2017  . Chronic fatigue 02/23/2017  . Hypertension 01/09/2017  . Brain metastasis ([place]) 12/26/2016  . Bone metastasis ([place]) 12/26/2016  . 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 Hypersensitivity reaction 12/22/2016  . Encounter for antineoplastic immunotherapy 12/17/2016  . Encounter for antineoplastic chemotherapy 12/17/2016  . Adenocarcinoma of left lung, stage 4 ([place]) 12/16/2016  . Liver metastasis ([place]) 11/18/2016  . Unilateral vocal cord paralysis 11/10/2016    Past Surgical History:  Procedure Laterality Date  . ABDOMINAL HYSTERECTOMY  08   vag, rectocele repair , bladder sling  . ANTERIOR CERVICAL DECOMP/DISCECTOMY FUSION  05/09/2012   Procedure: ANTERIOR CERVICAL DECOMPRESSION/DISCECTOMY FUSION 1 LEVEL;  Surgeon: Eustace Moore, MD;  Location: [facility];  Service: Neurosurgery;  Laterality: N/A;  anterior cervical decompression fusion cervical five-six  . BREAST CYST EXCISION  96   fibroadenoma lft  . BREAST SURGERY     03  . CARPAL TUNNEL RELEASE  05   bil  . DILATION AND CURETTAGE OF UTERUS  03    polyps  . EYE SURGERY     cataracts bilat; left eye surgery   . IR FLUORO GUIDE  PORT INSERTION RIGHT  05/09/2017  . IR US GUIDE VASC ACCESS RIGHT  05/09/2017  . MICROLARYNGOSCOPY W/VOCAL CORD INJECTION Left 08/03/2017   Procedure: MICROLARYNGOSCOPY WITH PROLARYN VOCAL CORD INJECTION;  Surgeon: Melissa Montane, MD;  Location: [facility];  Service: ENT;  Laterality: Left;  . TUBAL LIGATION  88    OB History    No data available       Home Medications    Prior to Admission medications   Medication Sig Start Date End Date Taking? Authorizing Provider  aspirin EC 81 MG tablet  Take 81 mg by mouth daily.   Yes [provider]  Cholecalciferol (VITAMIN D) 2000 units tablet Take 2,000 Units by mouth at bedtime.    Yes [provider]  conjugated estrogens (PREMARIN) vaginal cream Place 1 Applicatorful vaginally daily.   Yes [provider]  Cranberry 500 MG TABS Take 500 mg by mouth daily.    Yes [provider]  ezetimibe (ZETIA) 10 MG tablet Take 10 mg by mouth at bedtime.    Yes [provider]  ketotifen (ZADITOR) 0.025 % ophthalmic solution Place 1 drop into both eyes 2 (two) times daily.   Yes [provider]  lidocaine-prilocaine (EMLA) cream Apply 1 application topically as needed. Apply 1-2 tsp 1-2 hours prior to labs or chemotherapy 05/22/17  Yes Curt Bears, MD  loratadine (CLARITIN) 10 MG tablet Take 10 mg by mouth at bedtime.    Yes [provider]  LORazepam (ATIVAN) 0.5 MG tablet Take 1 tablet (0.5 mg total) by mouth every 8 (eight) hours. 02/16/17  Yes Curt Bears, MD  Multiple Vitamin (MULTIVITAMIN) tablet Take 1 tablet by mouth daily.   Yes [provider]  ondansetron (ZOFRAN) 8 MG tablet Take 1 tablet (8 mg total) by mouth every 8 (eight) hours as needed for nausea or vomiting. 01/16/17  Yes Curt Bears, MD  oxyCODONE (OXY IR/ROXICODONE) 5 MG immediate release tablet Take 1-3 tablets (5-15 mg total) by mouth every 4 (four) hours as needed for severe pain. 07/06/17  Yes Curt Bears, MD  OXYCONTIN 20 MG 12 hr tablet Take 20 mg by mouth 2 (two) times daily. 10/12/17  Yes [provider]  pantoprazole (PROTONIX) 40 MG tablet Take 40 mg by mouth 2 (two) times daily before a meal.    Yes [provider]  prochlorperazine (COMPAZINE) 10 MG tablet TAKE ONE TABLET BY MOUTH EVERY 6 HOURS AS NEEDED FOR NAUSEA/VOMITING 01/16/17  Yes Curt Bears, MD  raloxifene (EVISTA) 60 MG tablet Take 60 mg by mouth daily.   Yes [provider]  sodium chloride (OCEAN)  0.65 % nasal spray Place 1 spray into the nose daily as needed for congestion.    Yes [provider]  temazepam (RESTORIL) 30 MG capsule Take 1 capsule (30 mg total) by mouth at bedtime as needed for sleep. 07/06/17  Yes Curt Bears, MD  trimethoprim (TRIMPEX) 100 MG tablet Take 100 mg by mouth at bedtime.    Yes [provider]  VASCEPA 1 g CAPS Take 2 g by mouth 2 (two) times daily.  03/09/17  Yes [provider]  LORazepam (ATIVAN) 0.5 MG tablet 1 tablet po 30 minutes prior to radiation or MRI Patient not taking: Reported on 11/01/2017 02/23/17   Curt Bears, MD    Family History Family History  Problem Relation Age of Onset  . Cancer Mother        breast  . Cancer Father  prostate  . Muscular dystrophy Son     Social History Social History   Tobacco Use  . Smoking status: Never Smoker  . Smokeless tobacco: Never Used  Substance Use Topics  . Alcohol use: No  . Drug use: No     Allergies   Prednisone; Fenofibrate; Quinolones; Statins; and Septra [sulfamethoxazole-trimethoprim]   Review of Systems Review of Systems  Constitutional: Negative for chills and fever.  Respiratory: Negative for cough and shortness of breath.   Cardiovascular: Negative for chest pain.  Gastrointestinal: Positive for abdominal pain, nausea and vomiting. Negative for constipation.  Genitourinary: Positive for decreased urine volume. Negative for dysuria, flank pain and hematuria.  All other systems reviewed and are negative.    Physical Exam Updated Vital Signs BP 102/61 (BP Location: Right Arm)   Pulse 70   Temp 98.1 F (36.7 C) (Oral)   Resp 16   Ht 5\' 2"  (1.575 m)   Wt 59 kg (130 lb)   SpO2 94%   BMI 23.78 kg/m   Physical Exam  Constitutional: She appears well-developed and well-nourished. No distress.  HENT:  Head: Normocephalic and atraumatic.  Hoarse voice, per patient this is chronic and unchanged  Eyes: Conjunctivae are normal.  Right eye exhibits no discharge. Left eye exhibits no discharge.  Neck: No JVD present. No tracheal deviation present.  Cardiovascular:  Mildly tachycardic, port in right chest wall nontender with no erythema or swelling.   Pulmonary/Chest: Effort normal and breath sounds normal.  Equal rise and fall of chest, no increased work of breathing, speaking in full sentences without difficulty  Abdominal: Soft. She exhibits no distension. Bowel sounds are increased. There is no tenderness. There is no rigidity, no rebound, no guarding and no CVA tenderness.  Musculoskeletal: She exhibits no edema.  Neurological: She is alert.  Skin: Skin is warm and dry. No erythema.  Psychiatric: She has a normal mood and affect. Her behavior is normal.  Nursing note and vitals reviewed.    ED Treatments / Results  Labs (all labs ordered are listed, but only abnormal results are displayed) Labs Reviewed  COMPREHENSIVE METABOLIC PANEL - Abnormal; Notable for the following components:      Result Value   Sodium 134 (*)    Creatinine, Ser 1.03 (*)    Albumin 3.2 (*)    ALT 8 (*)    GFR calc non Af Amer 55 (*)    All other components within normal limits  CBC WITH DIFFERENTIAL/PLATELET - Abnormal; Notable for the following components:   WBC 24.5 (*)    RBC 3.54 (*)    Hemoglobin 9.7 (*)    HCT 29.8 (*)    RDW 16.0 (*)    Neutro Abs 18.3 (*)    Monocytes Absolute 1.8 (*)    Eosinophils Absolute 2.3 (*)    All other components within normal limits  MAGNESIUM - Abnormal; Notable for the following components:   Magnesium 1.6 (*)    All other components within normal limits  LIPASE, BLOOD  URINALYSIS, ROUTINE W REFLEX MICROSCOPIC    EKG  EKG Interpretation  Date/Time:  Wednesday November 01 2017 20:15:20 EST Ventricular Rate:  83 PR Interval:    QRS Duration: 89 QT Interval:  367 QTC Calculation: 432 R Axis:   -27 Text Interpretation:  Sinus rhythm Borderline left axis deviation Low voltage,  precordial leads No significant change since last tracing Confirmed by Duffy Bruce [phone]) on 11/01/2017 11:49:14 PM  Radiology No results found.  Procedures Procedures (including critical care time)  Medications Ordered in ED Medications  sodium chloride 0.9 % bolus 1,000 mL (0 mLs Intravenous Stopped 11/01/17 2238)  oxyCODONE (OXYCONTIN) 12 hr tablet 20 mg (20 mg Oral Given 11/01/17 2234)  promethazine (PHENERGAN) injection 12.5 mg (12.5 mg Intravenous Given 11/01/17 2301)  lactated ringers bolus 1,000 mL (1,000 mLs Intravenous New Bag/Given 11/01/17 2238)  magnesium sulfate IVPB 2 g 50 mL (0 g Intravenous Stopped 11/02/17 0133)  fentaNYL (SUBLIMAZE) injection 50 mcg (50 mcg Intravenous Given 11/02/17 0059)     Initial Impression / Assessment and Plan / ED Course  I have reviewed the triage vital signs and the nursing notes.  Pertinent labs & imaging results that were available during my care of the patient were reviewed by me and considered in my medical decision making (see chart for details).     Patient with complaint of nausea, vomiting, and diarrhea.  Has had decreased oral intake for several days.  Afebrile, vital signs are at patient's baseline.  She is nontoxic in appearance.  Suspect nausea, vomiting, diarrhea, and decreased appetite secondary to chemotherapy.  She is in a chemotherapy drug trial and her nausea has been ongoing and worsening.  She has a leukocytosis of 24, however this appears to be her baseline.  Magnesium levels slightly low, will replenish while in the ED.  Remainder of lab work appears to be at patient's baseline.  On reevaluation, patient is resting comfortably and states her pain has improved after administration of fentanyl.  Repeat abdominal examination is unremarkable.  She has tolerated several sips of apple juice and a cracker without emesis.  She has not had any vomiting or diarrhea while in the ED today.  She states her appetite is still  "nonexistent".  Last oncology visit note mentions possibility of initiating Marinol for this.  She has oncology follow-up scheduled for next week.  Patient is ambulatory without difficulty while in the ED today.  She feels comfortable with discharge home.  Awaiting urine sample to rule out UTI.   1:42 AM Signed out to oncoming provider PA [facility]. Awaiting urine sample.  If consistent with UTI, will culture and treat.  Patient likely stable for discharge home with follow-up with oncology as scheduled.  I have discussed indications for return to the ED, and patient and patient's son verbalized understanding of and agree.  Patient seen and evaluated by Dr. Ellender Hose who agrees with assessment and plan.  Final Clinical Impressions(s) / ED Diagnoses   Final diagnoses:  Nausea vomiting and diarrhea  Dehydration    ED Discharge Orders    None       Renita Papa, PA-C 11/02/17 0143    Duffy Bruce, MD 11/02/17 1615

## 2017-11-01 NOTE — ED Notes (Signed)
Bed: WLPT2 Expected date:  Expected time:  Means of arrival:  Comments: 

## 2017-11-01 NOTE — ED Notes (Signed)
Pt requesting blood work to be accessed via port.

## 2017-11-02 ENCOUNTER — Encounter (HOSPITAL_COMMUNITY): Payer: Self-pay | Admitting: Internal Medicine

## 2017-11-02 DIAGNOSIS — Z7982 Long term (current) use of aspirin: Secondary | ICD-10-CM | POA: Diagnosis not present

## 2017-11-02 DIAGNOSIS — A419 Sepsis, unspecified organism: Secondary | ICD-10-CM | POA: Diagnosis present

## 2017-11-02 DIAGNOSIS — D649 Anemia, unspecified: Secondary | ICD-10-CM | POA: Diagnosis present

## 2017-11-02 DIAGNOSIS — G894 Chronic pain syndrome: Secondary | ICD-10-CM | POA: Diagnosis present

## 2017-11-02 DIAGNOSIS — N3000 Acute cystitis without hematuria: Secondary | ICD-10-CM | POA: Diagnosis not present

## 2017-11-02 DIAGNOSIS — R197 Diarrhea, unspecified: Secondary | ICD-10-CM | POA: Diagnosis not present

## 2017-11-02 DIAGNOSIS — Z881 Allergy status to other antibiotic agents status: Secondary | ICD-10-CM | POA: Diagnosis not present

## 2017-11-02 DIAGNOSIS — Z6824 Body mass index (BMI) 24.0-24.9, adult: Secondary | ICD-10-CM | POA: Diagnosis not present

## 2017-11-02 DIAGNOSIS — E440 Moderate protein-calorie malnutrition: Secondary | ICD-10-CM | POA: Diagnosis not present

## 2017-11-02 DIAGNOSIS — K219 Gastro-esophageal reflux disease without esophagitis: Secondary | ICD-10-CM | POA: Diagnosis present

## 2017-11-02 DIAGNOSIS — Z79891 Long term (current) use of opiate analgesic: Secondary | ICD-10-CM | POA: Diagnosis not present

## 2017-11-02 DIAGNOSIS — Z79899 Other long term (current) drug therapy: Secondary | ICD-10-CM | POA: Diagnosis not present

## 2017-11-02 DIAGNOSIS — N390 Urinary tract infection, site not specified: Secondary | ICD-10-CM | POA: Diagnosis present

## 2017-11-02 DIAGNOSIS — R112 Nausea with vomiting, unspecified: Secondary | ICD-10-CM | POA: Diagnosis not present

## 2017-11-02 DIAGNOSIS — C3492 Malignant neoplasm of unspecified part of left bronchus or lung: Secondary | ICD-10-CM | POA: Diagnosis not present

## 2017-11-02 DIAGNOSIS — Z888 Allergy status to other drugs, medicaments and biological substances status: Secondary | ICD-10-CM | POA: Diagnosis not present

## 2017-11-02 DIAGNOSIS — E860 Dehydration: Secondary | ICD-10-CM | POA: Diagnosis not present

## 2017-11-02 DIAGNOSIS — E785 Hyperlipidemia, unspecified: Secondary | ICD-10-CM | POA: Diagnosis present

## 2017-11-02 LAB — URINALYSIS, ROUTINE W REFLEX MICROSCOPIC
BILIRUBIN URINE: NEGATIVE
GLUCOSE, UA: NEGATIVE mg/dL
Ketones, ur: 20 mg/dL — AB
Nitrite: POSITIVE — AB
Protein, ur: NEGATIVE mg/dL
SPECIFIC GRAVITY, URINE: 1.011 (ref 1.005–1.030)
pH: 7 (ref 5.0–8.0)

## 2017-11-02 LAB — HEPATIC FUNCTION PANEL
ALBUMIN: 2.8 g/dL — AB (ref 3.5–5.0)
ALK PHOS: 103 U/L (ref 38–126)
ALT: 9 U/L — ABNORMAL LOW (ref 14–54)
AST: 15 U/L (ref 15–41)
BILIRUBIN TOTAL: 0.4 mg/dL (ref 0.3–1.2)
Total Protein: 6.1 g/dL — ABNORMAL LOW (ref 6.5–8.1)

## 2017-11-02 LAB — BASIC METABOLIC PANEL
Anion gap: 8 (ref 5–15)
BUN: 10 mg/dL (ref 6–20)
CALCIUM: 8.5 mg/dL — AB (ref 8.9–10.3)
CHLORIDE: 105 mmol/L (ref 101–111)
CO2: 23 mmol/L (ref 22–32)
CREATININE: 0.83 mg/dL (ref 0.44–1.00)
GFR calc non Af Amer: >60 mL/min (ref >60)
Glucose, Bld: 95 mg/dL (ref 65–99)
Potassium: 3.7 mmol/L (ref 3.5–5.1)
SODIUM: 136 mmol/L (ref 135–145)

## 2017-11-02 MED ORDER — ICOSAPENT ETHYL 1 G PO CAPS: 2.0000 g | ORAL_CAPSULE | Freq: Two times a day (BID) | ORAL | Status: DC

## 2017-11-02 MED ORDER — SODIUM CHLORIDE 0.9% FLUSH
10.0000 mL | INTRAVENOUS | Status: DC | PRN
  Administered 2017-11-03: 10 mL
  Filled 2017-11-02: qty 40

## 2017-11-02 MED ORDER — PROCHLORPERAZINE EDISYLATE 5 MG/ML IJ SOLN
5.0000 mg | Freq: Four times a day (QID) | INTRAMUSCULAR | Status: DC | PRN
  Administered 2017-11-02: 5 mg via INTRAVENOUS
  Filled 2017-11-02: qty 2

## 2017-11-02 MED ORDER — CEFTRIAXONE SODIUM 2 G IJ SOLR
2.0000 g | INTRAMUSCULAR | Status: DC
  Administered 2017-11-02 – 2017-11-04 (×3): 2 g via INTRAVENOUS
  Filled 2017-11-02 (×4): qty 2

## 2017-11-02 MED ORDER — ONDANSETRON HCL 4 MG PO TABS
4.0000 mg | ORAL_TABLET | Freq: Four times a day (QID) | ORAL | Status: DC | PRN
  Filled 2017-11-02: qty 1

## 2017-11-02 MED ORDER — PANTOPRAZOLE SODIUM 40 MG PO TBEC
40.0000 mg | DELAYED_RELEASE_TABLET | Freq: Two times a day (BID) | ORAL | Status: DC
  Administered 2017-11-02 – 2017-11-06 (×9): 40 mg via ORAL
  Filled 2017-11-02 (×9): qty 1

## 2017-11-02 MED ORDER — ENOXAPARIN SODIUM 40 MG/0.4ML ~~LOC~~ SOLN
40.0000 mg | SUBCUTANEOUS | Status: DC
  Administered 2017-11-02 – 2017-11-06 (×5): 40 mg via SUBCUTANEOUS
  Filled 2017-11-02 (×6): qty 0.4

## 2017-11-02 MED ORDER — ACETAMINOPHEN 650 MG RE SUPP: 650.0000 mg | Freq: Four times a day (QID) | RECTAL | Status: DC | PRN

## 2017-11-02 MED ORDER — SODIUM CHLORIDE 0.9 % IV SOLN
INTRAVENOUS | Status: AC
  Administered 2017-11-02: 05:00:00 via INTRAVENOUS

## 2017-11-02 MED ORDER — ONDANSETRON HCL 4 MG/2ML IJ SOLN
4.0000 mg | Freq: Four times a day (QID) | INTRAMUSCULAR | Status: DC | PRN
  Administered 2017-11-03 – 2017-11-04 (×3): 4 mg via INTRAVENOUS
  Filled 2017-11-02 (×3): qty 2

## 2017-11-02 MED ORDER — OXYCODONE HCL ER 20 MG PO T12A
20.0000 mg | EXTENDED_RELEASE_TABLET | Freq: Two times a day (BID) | ORAL | Status: DC
  Administered 2017-11-02 – 2017-11-06 (×9): 20 mg via ORAL
  Filled 2017-11-02 (×2): qty 1
  Filled 2017-11-02: qty 2
  Filled 2017-11-02 (×7): qty 1

## 2017-11-02 MED ORDER — RISAQUAD PO CAPS
2.0000 | ORAL_CAPSULE | Freq: Three times a day (TID) | ORAL | Status: DC
  Administered 2017-11-02 – 2017-11-06 (×13): 2 via ORAL
  Filled 2017-11-02 (×16): qty 2

## 2017-11-02 MED ORDER — DEXTROSE 5 % IV SOLN
1.0000 g | Freq: Once | INTRAVENOUS | Status: AC
  Administered 2017-11-02: 1 g via INTRAVENOUS
  Filled 2017-11-02: qty 10

## 2017-11-02 MED ORDER — ACETAMINOPHEN 325 MG PO TABS: 650.0000 mg | ORAL_TABLET | Freq: Four times a day (QID) | ORAL | Status: DC | PRN

## 2017-11-02 MED ORDER — TEMAZEPAM 15 MG PO CAPS
30.0000 mg | ORAL_CAPSULE | Freq: Every evening | ORAL | Status: DC | PRN
Start: 2017-11-02 — End: 2017-11-06

## 2017-11-02 MED ORDER — FENTANYL CITRATE (PF) 100 MCG/2ML IJ SOLN
50.0000 ug | Freq: Once | INTRAMUSCULAR | Status: AC
  Administered 2017-11-02: 50 ug via INTRAVENOUS
  Filled 2017-11-02: qty 2

## 2017-11-02 MED ORDER — OXYCODONE HCL 5 MG PO TABS
5.0000 mg | ORAL_TABLET | ORAL | Status: DC | PRN
  Administered 2017-11-03 – 2017-11-05 (×6): 5 mg via ORAL
  Filled 2017-11-02 (×6): qty 1

## 2017-11-02 NOTE — ED Provider Notes (Signed)
Patient signed out to me at shift change.  Patient with stage IV lung cancer.  Here with nausea, vomiting, which she attributes to chemotherapy.  Also has some abdominal discomfort.  Urinalysis is consistent with UTI.  Given patient's nausea and vomiting, I do not believe her to be appropriate for outpatient treatment with p.o. antibiotics.  Therefore, will admit to medicine for IV antibiotics.  Appreciate Dr. Hal Hope for admitting the patient.   Montine Circle, PA-C 11/02/17 7011    Duffy Bruce, MD 11/02/17 [phone]

## 2017-11-02 NOTE — Progress Notes (Addendum)
PROGRESS NOTE                                                                                                                                                                                                             Patient Demographics:    Karen Vasquez, is a 66 y.o. female, DOB - [birth year], OMB:[id number]  Admit date - 11/01/2017   Admitting Physician No admitting provider for patient encounter.  Outpatient Primary MD for the patient is [name], MD  LOS - 0  Outpatient Specialists: Oncologist at [facility] Complaint  Patient presents with  . Emesis  . Abdominal Pain  . chemo patient       Brief Narrative   66 year old female with stage IV lung cancer on tried immunotherapy at [facility] presented to the ED with nausea and vomiting progressive over the past 2 weeks and one episode of diarrhea. Denied any fevers, chills, abdominal pain, dysuria, hematemesis, melena, bright red blood per rectum. Denies recent results of antibiotic. Reports very poor by mouth intake and generalized weakness. In the ED she was found to be tachycardic, tachypneic with low normal blood pressure. Blood work showed significant leukocytosis (24.5K), hemoglobin 9.7 and normal renal function. Lactate was negative. UA suggestive of UTI. Patient admitted for SIRS with UTI.   Subjective:   No further vomiting. Able to tolerate full liquid diet.   Assessment  & Plan :    Principal Problem: Systemic inflammatory response syndrome (SIRS) (HCC)   Nausea vomiting and diarrhea Secondary to UTI. No further GI symptoms since admission. Tolerating full liquid diet. Continue IV fluids. Supportive care with Tylenol and antiemetics. Follow urine culture. Continue empiric Rocephin.  UTI As above. Follow cultures. Empiric antibiotics.  Leukocytosis Appears to be chronic and worsened with UTI. Monitor for now.  Chronic pain Continue home pain  medications.  Adenocarcinoma for left lung, stage IV Followed at [facility] and on trying immunotherapy.  Hyperlipidemia Continue Zetia.   Hypomagnesemia Replenished     Code Status :  Full code  Family Communication  :  None at bedside  Disposition Plan  :  Home pending clinical improvement, possibly in the next 48 hours  Barriers For Discharge :  Active symptoms  Consults  :  None  Procedures  :  None  DVT Prophylaxis  :  Lovenox Lab Results  Component Value Date   PLT 388 11/01/2017    Antibiotics  :   Anti-infectives (From admission, onward)   Start     Dose/Rate Route Frequency Ordered Stop   11/02/17 1200  cefTRIAXone (ROCEPHIN) 2 g in dextrose 5 % 50 mL IVPB     2 g 100 mL/hr over 30 Minutes Intravenous Every 24 hours 11/02/17 0417     11/02/17 0215  cefTRIAXone (ROCEPHIN) 1 g in dextrose 5 % 50 mL IVPB     1 g 100 mL/hr over 30 Minutes Intravenous  Once 11/02/17 0208 11/02/17 0405        Objective:   Vitals:   11/02/17 0500 11/02/17 0600 11/02/17 0917 11/02/17 0923  BP: 103/65 106/73 (!) 93/59 (!) 93/59  Pulse: 70 77  77  Resp: 16  (!) 31 18  Temp:      TempSrc:    Oral  SpO2: 94% 92%  96%  Weight:      Height:        Wt Readings from Last 3 Encounters:  11/01/17 59 kg (130 lb)  07/31/17 65.6 kg (144 lb 9.6 oz)  07/19/17 65.3 kg (144 lb)     Intake/Output Summary (Last 24 hours) at 11/02/2017 1018 Last data filed at 11/02/2017 0405 Gross per 24 hour  Intake 1890 ml  Output -  Net 1890 ml     Physical Exam  Gen: not in distress ,  fatigued  HEENT:  Pallor present, dry oral  mucosa, supple neck Chest: clear b/l, no added sounds, right-sided Port-A-Cath  CVS: N S1&S2, no murmurs,  GI: soft, NT, ND, BS+ Musculoskeletal: warm, no edema     Data Review:    CBC Recent Labs  Lab 11/01/17 2020  WBC 24.5*  HGB 9.7*  HCT 29.8*  PLT 388  MCV 84.2  MCH 27.4  MCHC 32.6  RDW 16.0*  LYMPHSABS 2.0  MONOABS 1.8*  EOSABS 2.3*    BASOSABS 0.0    Chemistries  Recent Labs  Lab 11/01/17 2020 11/02/17 0630  NA 134* 136  K 3.8 3.7  CL 102 105  CO2 22 23  GLUCOSE 98 95  BUN 14 10  CREATININE 1.03* 0.83  CALCIUM 8.9 8.5*  MG 1.6*  --   AST 18 15  ALT 8* 9*  ALKPHOS 121 103  BILITOT 0.4 0.4   ------------------------------------------------------------------------------------------------------------------ No results for input(s): CHOL, HDL, LDLCALC, TRIG, CHOLHDL, LDLDIRECT in the last 72 hours.  No results found for: HGBA1C ------------------------------------------------------------------------------------------------------------------ No results for input(s): TSH, T4TOTAL, T3FREE, THYROIDAB in the last 72 hours.  Invalid input(s): FREET3 ------------------------------------------------------------------------------------------------------------------ No results for input(s): VITAMINB12, FOLATE, FERRITIN, TIBC, IRON, RETICCTPCT in the last 72 hours.  Coagulation profile No results for input(s): INR, PROTIME in the last 168 hours.  No results for input(s): DDIMER in the last 72 hours.  Cardiac Enzymes No results for input(s): CKMB, TROPONINI, MYOGLOBIN in the last 168 hours.  Invalid input(s): CK ------------------------------------------------------------------------------------------------------------------ No results found for: BNP  Inpatient Medications  Scheduled Meds: . acidophilus  2 capsule Oral TID  . enoxaparin (LOVENOX) injection  40 mg Subcutaneous Q24H  . Icosapent Ethyl  2 g Oral BID  . oxyCODONE  20 mg Oral Q12H  . pantoprazole  40 mg Oral BID   Continuous Infusions: . sodium chloride 75 mL/hr at 11/02/17 0452  . cefTRIAXone (ROCEPHIN)  IV     PRN Meds:.acetaminophen **OR** acetaminophen, ondansetron **OR** ondansetron (ZOFRAN) IV, oxyCODONE, prochlorperazine, temazepam  Micro Results No results  found for this or any previous visit (from the past 240  hour(s)).  Radiology Reports No results found.  Time Spent in minutes  20   Blondina Coderre M.D on 11/02/2017 at 10:18 AM  Between 7am to 7pm - Pager - [phone]  After 7pm go to [email or web address] - password [facility]  [facility] -  Office  [phone]

## 2017-11-02 NOTE — Progress Notes (Signed)
PHARMACY NOTE -  ANTIBIOTIC RENAL DOSE ADJUSTMENT   Request received for Pharmacy to assist with antibiotic renal dose adjustment.  Patient has been initiated on Ceftriaxone 2gm iv q24hr  for UTI. SCr 1.03, estimated CrCl 42 ml/min Current dosage is appropriate and need for further dosage adjustment appears unlikely at present. Will sign off at this time.  Please reconsult if a change in clinical status warrants re-evaluation of dosage.

## 2017-11-02 NOTE — H&P (Signed)
History and Physical    ADMIRE BUNNELL RCV:[id number] DOB: 08-24-1951 DOA: 11/01/2017  PCP: [name], MD  Patient coming from: Home.  Chief Complaint: Nausea vomiting and diarrhea.  HPI: Karen Vasquez is a 66 y.o. female with history of stage IV lung cancer on trial immunotherapy at [facility] presents to the ER with complaints of worsening nausea vomiting and had one episode of diarrhea.  Denies any abdominal pain.  Denies any fever or chills.  Patient has been having nausea for last few weeks which is acutely worsened last few days.  Poor appetite.  Had only one episode of diarrhea yesterday.  Denies any recent use of antibiotics.  ED Course: In the ER patient was found to be generally weak with labs showing possible UTI.  Patient is being admitted for hydration and was started on ceftriaxone for UTI.  Review of Systems: As per HPI, rest all negative.   Past Medical History:  Diagnosis Date  . Adenocarcinoma of left lung, stage 4 ([place]) 12/16/2016  . Arthritis   . Asthma due to seasonal allergies   . Chronic fatigue 02/23/2017  . Encounter for antineoplastic chemotherapy 12/17/2016  . GERD (gastroesophageal reflux disease)   . Goals of care, counseling/discussion 12/17/2016  . Hernia of abdominal wall    umbilical  . No pertinent past medical history    COLD UPPER RESP STARTED ZPAK 6/14  . Recurrent upper respiratory infection (URI)    recent sinus inf  . Sinus infection 02/25/2017  . Thyroid nodule   . Urine blood    hematuria    Past Surgical History:  Procedure Laterality Date  . ABDOMINAL HYSTERECTOMY  08   vag, rectocele repair , bladder sling  . ANTERIOR CERVICAL DECOMP/DISCECTOMY FUSION  05/09/2012   Procedure: ANTERIOR CERVICAL DECOMPRESSION/DISCECTOMY FUSION 1 LEVEL;  Surgeon: Eustace Moore, MD;  Location: [place];  Service: Neurosurgery;  Laterality: N/A;  anterior cervical decompression fusion cervical five-six  . BREAST CYST EXCISION  96   fibroadenoma lft  . BREAST SURGERY     03  . CARPAL TUNNEL RELEASE  05   bil  . DILATION AND CURETTAGE OF UTERUS  03    polyps  . EYE SURGERY     cataracts bilat; left eye surgery   . IR FLUORO GUIDE PORT INSERTION RIGHT  05/09/2017  . IR US GUIDE VASC ACCESS RIGHT  05/09/2017  . MICROLARYNGOSCOPY W/VOCAL CORD INJECTION Left 08/03/2017   Procedure: MICROLARYNGOSCOPY WITH PROLARYN VOCAL CORD INJECTION;  Surgeon: Melissa Montane, MD;  Location: [facility];  Service: ENT;  Laterality: Left;  . TUBAL LIGATION  88     reports that  has never smoked. she has never used smokeless tobacco. She reports that she does not drink alcohol or use drugs.  Allergies  Allergen Reactions  . Prednisone Other (See Comments)    "Steroids give me chest pain"  . Fenofibrate Other (See Comments)    Muscle aches  . Quinolones Other (See Comments)    Wants to avoid due to possibility of tendon rupture.   . Statins Other (See Comments)    Muscle pain   . Septra [Sulfamethoxazole-Trimethoprim] Rash    Family History  Problem Relation Age of Onset  . Cancer Mother        breast  . Cancer Father        prostate  . Muscular dystrophy Son     Prior to Admission medications   Medication Sig Start  Date End Date Taking? Authorizing Provider  aspirin EC 81 MG tablet Take 81 mg by mouth daily.   Yes [provider]  Cholecalciferol (VITAMIN D) 2000 units tablet Take 2,000 Units by mouth at bedtime.    Yes [provider]  conjugated estrogens (PREMARIN) vaginal cream Place 1 Applicatorful vaginally daily.   Yes [provider]  Cranberry 500 MG TABS Take 500 mg by mouth daily.    Yes [provider]  ezetimibe (ZETIA) 10 MG tablet Take 10 mg by mouth at bedtime.    Yes [provider]  ketotifen (ZADITOR) 0.025 % ophthalmic solution Place 1 drop into both eyes 2 (two) times daily.   Yes [provider]  lidocaine-prilocaine (EMLA) cream Apply 1 application  topically as needed. Apply 1-2 tsp 1-2 hours prior to labs or chemotherapy 05/22/17  Yes Curt Bears, MD  loratadine (CLARITIN) 10 MG tablet Take 10 mg by mouth at bedtime.    Yes [provider]  LORazepam (ATIVAN) 0.5 MG tablet Take 1 tablet (0.5 mg total) by mouth every 8 (eight) hours. 02/16/17  Yes Curt Bears, MD  Multiple Vitamin (MULTIVITAMIN) tablet Take 1 tablet by mouth daily.   Yes [provider]  ondansetron (ZOFRAN) 8 MG tablet Take 1 tablet (8 mg total) by mouth every 8 (eight) hours as needed for nausea or vomiting. 01/16/17  Yes Curt Bears, MD  oxyCODONE (OXY IR/ROXICODONE) 5 MG immediate release tablet Take 1-3 tablets (5-15 mg total) by mouth every 4 (four) hours as needed for severe pain. 07/06/17  Yes Curt Bears, MD  OXYCONTIN 20 MG 12 hr tablet Take 20 mg by mouth 2 (two) times daily. 10/12/17  Yes [provider]  pantoprazole (PROTONIX) 40 MG tablet Take 40 mg by mouth 2 (two) times daily before a meal.    Yes [provider]  prochlorperazine (COMPAZINE) 10 MG tablet TAKE ONE TABLET BY MOUTH EVERY 6 HOURS AS NEEDED FOR NAUSEA/VOMITING 01/16/17  Yes Curt Bears, MD  raloxifene (EVISTA) 60 MG tablet Take 60 mg by mouth daily.   Yes [provider]  sodium chloride (OCEAN) 0.65 % nasal spray Place 1 spray into the nose daily as needed for congestion.    Yes [provider]  temazepam (RESTORIL) 30 MG capsule Take 1 capsule (30 mg total) by mouth at bedtime as needed for sleep. 07/06/17  Yes Curt Bears, MD  trimethoprim (TRIMPEX) 100 MG tablet Take 100 mg by mouth at bedtime.    Yes [provider]  VASCEPA 1 g CAPS Take 2 g by mouth 2 (two) times daily.  03/09/17  Yes [provider]  LORazepam (ATIVAN) 0.5 MG tablet 1 tablet po 30 minutes prior to radiation or MRI Patient not taking: Reported on 11/01/2017 02/23/17   Curt Bears, MD    Physical Exam: Vitals:   11/02/17 0032  11/02/17 0058 11/02/17 0146 11/02/17 0150  BP: 102/61     Pulse: 70 83 68 72  Resp: 16     Temp:      TempSrc:      SpO2: 94% 97% 96% 96%  Weight:      Height:          Constitutional: Moderately built and nourished. Vitals:   11/02/17 0032 11/02/17 0058 11/02/17 0146 11/02/17 0150  BP: 102/61     Pulse: 70 83 68 72  Resp: 16     Temp:      TempSrc:  SpO2: 94% 97% 96% 96%  Weight:      Height:       Eyes: Anicteric no pallor. ENMT: No discharge from the ears eyes nose or mouth. Neck: No mass felt.  No neck rigidity. Respiratory: No rhonchi or crepitations. Cardiovascular: S1-S2 heard no murmurs appreciated. Abdomen: Soft nontender bowel sounds present. Musculoskeletal: No edema.  No joint effusion. Skin: No rash.  Skin appears warm. Neurologic: Alert awake oriented to time place and person.  Moves all extremities 5 x 5. Psychiatric: Appears normal.  Normal affect.   Labs on Admission: I have personally reviewed following labs and imaging studies  CBC: Recent Labs  Lab 11/01/17 2020  WBC 24.5*  NEUTROABS 18.3*  HGB 9.7*  HCT 29.8*  MCV 84.2  PLT 409   Basic Metabolic Panel: Recent Labs  Lab 11/01/17 2020  NA 134*  K 3.8  CL 102  CO2 22  GLUCOSE 98  BUN 14  CREATININE 1.03*  CALCIUM 8.9  MG 1.6*   GFR: Estimated Creatinine Clearance: 41.9 mL/min (A) (by C-G formula based on SCr of 1.03 mg/dL (H)). Liver Function Tests: Recent Labs  Lab 11/01/17 2020  AST 18  ALT 8*  ALKPHOS 121  BILITOT 0.4  PROT 6.7  ALBUMIN 3.2*   Recent Labs  Lab 11/01/17 2020  LIPASE 23   No results for input(s): AMMONIA in the last 168 hours. Coagulation Profile: No results for input(s): INR, PROTIME in the last 168 hours. Cardiac Enzymes: No results for input(s): CKTOTAL, CKMB, CKMBINDEX, TROPONINI in the last 168 hours. BNP (last 3 results) No results for input(s): PROBNP in the last 8760 hours. HbA1C: No results for input(s): HGBA1C in the last 72  hours. CBG: No results for input(s): GLUCAP in the last 168 hours. Lipid Profile: No results for input(s): CHOL, HDL, LDLCALC, TRIG, CHOLHDL, LDLDIRECT in the last 72 hours. Thyroid Function Tests: No results for input(s): TSH, T4TOTAL, FREET4, T3FREE, THYROIDAB in the last 72 hours. Anemia Panel: No results for input(s): VITAMINB12, FOLATE, FERRITIN, TIBC, IRON, RETICCTPCT in the last 72 hours. Urine analysis:    Component Value Date/Time   COLORURINE YELLOW 11/02/2017 0133   APPEARANCEUR CLOUDY (A) 11/02/2017 0133   LABSPEC 1.011 11/02/2017 0133   PHURINE 7.0 11/02/2017 0133   GLUCOSEU NEGATIVE 11/02/2017 0133   HGBUR MODERATE (A) 11/02/2017 0133   BILIRUBINUR NEGATIVE 11/02/2017 0133   KETONESUR 20 (A) 11/02/2017 0133   PROTEINUR NEGATIVE 11/02/2017 0133   UROBILINOGEN 0.2 11/01/2007 1024   NITRITE POSITIVE (A) 11/02/2017 0133   LEUKOCYTESUR LARGE (A) 11/02/2017 0133   Sepsis Labs: @LABRCNTIP (procalcitonin:4,lacticidven:4) )No results found for this or any previous visit (from the past 240 hour(s)).   Radiological Exams on Admission: No results found.   Assessment/Plan Principal Problem:   Nausea vomiting and diarrhea Active Problems:   Adenocarcinoma of left lung, stage 4 (HCC)   Normochromic normocytic anemia    1. Nausea vomiting and one episode of diarrhea -not sure of the cause.  For now we will gently hydrate and I have placed patient on full liquid diet and antiemetics.  Closely observe.  Patient does have possible UTI which could also contribute.  If nausea vomiting persist may need further imaging. 2. Possible UTI on ceftriaxone.  Follow cultures. 3. Leukocytosis and normocytic normochromic anemia -appears chronic.  Follow. 4. Chronic pain on OxyContin and oxycodone IR which will be continued. 5. Hyperlipidemia on Zetia and Vascepa. 6. Metastatic lung cancer being followed by [facility] and is  on trial immunotherapy.   DVT prophylaxis: Lovenox. Code  Status: Full code. Family Communication: Discussed with patient. Disposition Plan: Home. Consults called: Physical therapy. Admission status: Observation.   Rise Patience MD [facility] Pager [phone].  If 7PM-7AM, please contact night-coverage [email or web address] Password [email or web address]  11/02/2017, 4:01 AM

## 2017-11-02 NOTE — ED Notes (Signed)
Assisted pt in getting unhooked from monitor leads for her to walk to bathroom.  She walked unassisted and in no distress to bathroom.  Went back to her bed and changed into a gown unassisted as well.  Has no complaints at present.

## 2017-11-02 NOTE — Discharge Instructions (Addendum)
Continue taking home medications as prescribed.  Drink plenty of fluids.  Eat a diet of bland foods that will not upset your stomach.  Follow-up with your oncologist as scheduled next week.    Return to the ED immediately for any concerning signs or symptoms develop such as fevers, not being able to keep any food or drink down for several days, persisting/worsening shortness of breath or chest pain, or blood in the urine or stool.

## 2017-11-03 DIAGNOSIS — E440 Moderate protein-calorie malnutrition: Secondary | ICD-10-CM

## 2017-11-03 DIAGNOSIS — E860 Dehydration: Secondary | ICD-10-CM

## 2017-11-03 LAB — CBC
HCT: 27 % — ABNORMAL LOW (ref 36.0–46.0)
Hemoglobin: 8.9 g/dL — ABNORMAL LOW (ref 12.0–15.0)
MCH: 27.8 pg (ref 26.0–34.0)
MCHC: 33 g/dL (ref 30.0–36.0)
MCV: 84.4 fL (ref 78.0–100.0)
PLATELETS: 334 10*3/uL (ref 150–400)
RBC: 3.2 MIL/uL — ABNORMAL LOW (ref 3.87–5.11)
RDW: 16 % — AB (ref 11.5–15.5)
WBC: 19.6 10*3/uL — ABNORMAL HIGH (ref 4.0–10.5)

## 2017-11-03 LAB — BASIC METABOLIC PANEL
Anion gap: 6 (ref 5–15)
BUN: 6 mg/dL (ref 6–20)
CALCIUM: 8.4 mg/dL — AB (ref 8.9–10.3)
CHLORIDE: 105 mmol/L (ref 101–111)
CO2: 24 mmol/L (ref 22–32)
CREATININE: 0.7 mg/dL (ref 0.44–1.00)
GFR calc non Af Amer: >60 mL/min (ref >60)
Glucose, Bld: 107 mg/dL — ABNORMAL HIGH (ref 65–99)
Potassium: 3.4 mmol/L — ABNORMAL LOW (ref 3.5–5.1)
SODIUM: 135 mmol/L (ref 135–145)

## 2017-11-03 MED ORDER — SENNOSIDES-DOCUSATE SODIUM 8.6-50 MG PO TABS
2.0000 | ORAL_TABLET | Freq: Every day | ORAL | Status: DC
  Administered 2017-11-03: 2 via ORAL
  Filled 2017-11-03: qty 2

## 2017-11-03 MED ORDER — OMEGA-3-ACID ETHYL ESTERS 1 G PO CAPS
2.0000 g | ORAL_CAPSULE | Freq: Two times a day (BID) | ORAL | Status: DC
  Administered 2017-11-03 – 2017-11-04 (×2): 2 g via ORAL
  Filled 2017-11-03 (×6): qty 2

## 2017-11-03 NOTE — Progress Notes (Signed)
PROGRESS NOTE  LUE SYKORA OIN:[id number] DOB: [birth year] DOA: 11/01/2017 PCP: [name], MD   LOS: 1 day   Brief Narrative / Interim history: 66 year old female with stage IV lung cancer on tried immunotherapy at [facility] presented to the ED with nausea and vomiting progressive over the past 2 weeks and one episode of diarrhea. Denied any fevers, chills, abdominal pain, dysuria, hematemesis, melena, bright red blood per rectum. Denies recent results of antibiotic. Reports very poor by mouth intake and generalized weakness. In the ED she was found to be tachycardic, tachypneic with low normal blood pressure. Blood work showed significant leukocytosis (24.5K), hemoglobin 9.7 and normal renal function. Lactate was negative. UA suggestive of UTI. Patient admitted for SIRS with UTI.  Assessment & Plan: Principal Problem:   Nausea vomiting and diarrhea Active Problems:   Adenocarcinoma of left lung, stage 4 (HCC)   Normochromic normocytic anemia   UTI (urinary tract infection)   Systemic inflammatory response syndrome (SIRS) (HCC) Nausea vomiting and diarrhea -Secondary to UTI. No further GI symptoms since admission. Tolerating regular diet.  Discontinue IV fluids today.  -Feeling improved, however still weak, continue ceftriaxone urine cultures still pending today, reincubated for better growth  UTI -As above. Follow cultures. Empiric antibiotics.  Leukocytosis -Appears to be chronic and worsened with UTI. Monitor for now.  Chronic pain -Continue home pain medications.  Adenocarcinoma for left lung, stage IV -Followed at [facility] and on trying immunotherapy.  Hyperlipidemia -Continue Zetia.  Hypomagnesemia -Replenished   DVT prophylaxis: Lovenox Code Status: Full code Family Communication: no family at bedside Disposition Plan: home when ready   Consultants:   None   Procedures:   None   Antimicrobials:  Ceftriaxone 12/20 >>   Subjective: -Feels better, still  a little bit weak, no further vomiting however she still has mild nausea.  Complains of low appetite at baseline  Objective: Vitals:   11/02/17 1140 11/02/17 1336 11/02/17 2035 11/03/17 0425  BP: 113/71 109/68 103/73 116/67  Pulse: 79 77 86 88  Resp: 16 14 20 16   Temp: 98.2 F (36.8 C) 98 F (36.7 C) 99.3 F (37.4 C) 98.4 F (36.9 C)  TempSrc: Oral Oral Oral Oral  SpO2: 92% 96% 95% 97%  Weight: 59 kg (130 lb)   60.1 kg (132 lb 9.6 oz)  Height: 5\' 2"  (1.575 m)       Intake/Output Summary (Last 24 hours) at 11/03/2017 1100 Last data filed at 11/03/2017 1042 Gross per 24 hour  Intake 470 ml  Output -  Net 470 ml   Filed Weights   11/01/17 1814 11/02/17 1140 11/03/17 0425  Weight: 59 kg (130 lb) 59 kg (130 lb) 60.1 kg (132 lb 9.6 oz)    Examination:  Constitutional: NAD Eyes: lids and conjunctivae normal ENMT: Mucous membranes are moist.  Neck: normal, supple, no masses Respiratory: clear to auscultation bilaterally, no wheezing, no crackles.  Cardiovascular: Regular rate and rhythm, no murmurs / rubs / gallops.  Abdomen: no tenderness.  Skin: no rashes Neurologic: non focal  Data Reviewed: I have independently reviewed following labs and imaging studies  CBC: Recent Labs  Lab 11/01/17 2020 11/03/17 0544  WBC 24.5* 19.6*  NEUTROABS 18.3*  --   HGB 9.7* 8.9*  HCT 29.8* 27.0*  MCV 84.2 84.4  PLT 388 709   Basic Metabolic Panel: Recent Labs  Lab 11/01/17 2020 11/02/17 0630 11/03/17 0544  NA 134* 136 135  K 3.8 3.7 3.4*  CL 102 105 105  CO2  22 23 24   GLUCOSE 98 95 107*  BUN 14 10 6   CREATININE 1.03* 0.83 0.70  CALCIUM 8.9 8.5* 8.4*  MG 1.6*  --   --    GFR: Estimated Creatinine Clearance: 54 mL/min (by C-G formula based on SCr of 0.7 mg/dL). Liver Function Tests: Recent Labs  Lab 11/01/17 2020 11/02/17 0630  AST 18 15  ALT 8* 9*  ALKPHOS 121 103  BILITOT 0.4 0.4  PROT 6.7 6.1*  ALBUMIN 3.2* 2.8*   Recent Labs  Lab 11/01/17 2020  LIPASE  23   No results for input(s): AMMONIA in the last 168 hours. Coagulation Profile: No results for input(s): INR, PROTIME in the last 168 hours. Cardiac Enzymes: No results for input(s): CKTOTAL, CKMB, CKMBINDEX, TROPONINI in the last 168 hours. BNP (last 3 results) No results for input(s): PROBNP in the last 8760 hours. HbA1C: No results for input(s): HGBA1C in the last 72 hours. CBG: No results for input(s): GLUCAP in the last 168 hours. Lipid Profile: No results for input(s): CHOL, HDL, LDLCALC, TRIG, CHOLHDL, LDLDIRECT in the last 72 hours. Thyroid Function Tests: No results for input(s): TSH, T4TOTAL, FREET4, T3FREE, THYROIDAB in the last 72 hours. Anemia Panel: No results for input(s): VITAMINB12, FOLATE, FERRITIN, TIBC, IRON, RETICCTPCT in the last 72 hours. Urine analysis:    Component Value Date/Time   COLORURINE YELLOW 11/02/2017 0133   APPEARANCEUR CLOUDY (A) 11/02/2017 0133   LABSPEC 1.011 11/02/2017 0133   PHURINE 7.0 11/02/2017 0133   GLUCOSEU NEGATIVE 11/02/2017 0133   HGBUR MODERATE (A) 11/02/2017 0133   BILIRUBINUR NEGATIVE 11/02/2017 0133   KETONESUR 20 (A) 11/02/2017 0133   PROTEINUR NEGATIVE 11/02/2017 0133   UROBILINOGEN 0.2 11/01/2007 1024   NITRITE POSITIVE (A) 11/02/2017 0133   LEUKOCYTESUR LARGE (A) 11/02/2017 0133   Sepsis Labs: Invalid input(s): PROCALCITONIN, LACTICIDVEN  Recent Results (from the past 240 hour(s))  Urine culture     Status: None (Preliminary result)   Collection Time: 11/02/17  2:09 AM  Result Value Ref Range Status   Specimen Description URINE, RANDOM  Final   Special Requests NONE  Final   Culture   Final    CULTURE REINCUBATED FOR BETTER GROWTH Performed at [facility], [address] [place], [place] [address]    Report Status PENDING  Incomplete      Radiology Studies: No results found.   Scheduled Meds: . acidophilus  2 capsule Oral TID  . enoxaparin (LOVENOX) injection  40 mg Subcutaneous Q24H  .  Icosapent Ethyl  2 g Oral BID  . oxyCODONE  20 mg Oral Q12H  . pantoprazole  40 mg Oral BID   Continuous Infusions: . cefTRIAXone (ROCEPHIN)  IV Stopped (11/02/17 1534)    Marzetta Board, MD, PhD [facility] Pager [phone]  If 7PM-7AM, please contact night-coverage [email or web address] Password [email or web address] 11/03/2017, 11:00 AM

## 2017-11-03 NOTE — Progress Notes (Signed)
Initial Nutrition Assessment  DOCUMENTATION CODES:   Non-severe (moderate) malnutrition in context of chronic illness  INTERVENTION:   Magic cup TID with meals, each supplement provides 290 kcal and 9 grams of protein  Monitor and supplement electrolytes as needed per MD descretion.   NUTRITION DIAGNOSIS:   Moderate Malnutrition related to chronic illness, cancer and cancer related treatments as evidenced by 8.3% weight loss in 3 months, mild fat depletion, moderate muscle depletion, mild muscle depletion.  GOAL:   Patient will meet greater than or equal to 90% of their needs  MONITOR:   PO intake, Supplement acceptance, Weight trends, Labs  REASON FOR ASSESSMENT:   Malnutrition Screening Tool    ASSESSMENT:   Pt with PMH significant for stage IV lung cancer on trial immunotherapy at [facility]. Presents this admission with SIRS secondary to UTI with ongoing nausea/vomiting.    Spoke with pt at bedside. Reports having decreased PO intake for the last two weeks due to increasing nausea. Admits she has feelings of early satiety upon eating a couple of bites. Pt saw cancer center RD in May who suggested she try to eat six smaller meals a day. Pt continues to try stating whenever she feels a little hungry she eats. Pt is experiencing taste changes and has a hard time tolerating different textures. Pt does not use supplementation at home. Discussed the importance of protein intake for preservation of lean body mass in her immunotherapy moving forward. Pt amendable to Magic Cups this stay.   Pt reports a UBW of 172 lb, the last time being at that weight 2 years ago. Pt knows she has lost weight but unsure of how much. Records indicate pt has lost 8.3% of body weight in 3 months. This is significant for time frame.   Nutrition-Focused physical exam completed.   Medications reviewed and include: Iv abx Labs reviewed: K 3.4 (L) Mg 1.6 (L)  NUTRITION - FOCUSED PHYSICAL EXAM:    Most  Recent Value  Orbital Region  No depletion  Upper Arm Region  Mild depletion  Thoracic and Lumbar Region  No depletion  Buccal Region  No depletion  Temple Region  Moderate depletion  Clavicle Bone Region  Moderate depletion  Clavicle and Acromion Bone Region  Mild depletion  Scapular Bone Region  No depletion  Dorsal Hand  Mild depletion  Patellar Region  Moderate depletion  Anterior Thigh Region  Moderate depletion  Posterior Calf Region  Moderate depletion  Edema (RD Assessment)  None  Hair  Reviewed  Eyes  Reviewed  Mouth  Reviewed  Skin  Reviewed  Nails  Reviewed       Diet Order:  Diet regular Room service appropriate? Yes; Fluid consistency: Thin  EDUCATION NEEDS:   Education needs have been addressed  Skin:  Skin Assessment: Reviewed RN Assessment  Last BM:  11/02/17  Height:   Ht Readings from Last 1 Encounters:  11/02/17 5\' 2"  (1.575 m)    Weight:   Wt Readings from Last 1 Encounters:  11/03/17 132 lb 9.6 oz (60.1 kg)    Ideal Body Weight:  50 kg  BMI:  Body mass index is 24.25 kg/m.  Estimated Nutritional Needs:   Kcal:  1500-1700 kcal/day  Protein:  75-85 g/day  Fluid:  >1.5 L/day    Mariana Single RD, LDN Clinical Nutrition Pager # - [phone]

## 2017-11-04 DIAGNOSIS — E440 Moderate protein-calorie malnutrition: Secondary | ICD-10-CM

## 2017-11-04 DIAGNOSIS — A419 Sepsis, unspecified organism: Secondary | ICD-10-CM

## 2017-11-04 DIAGNOSIS — D649 Anemia, unspecified: Secondary | ICD-10-CM

## 2017-11-04 DIAGNOSIS — C3492 Malignant neoplasm of unspecified part of left bronchus or lung: Secondary | ICD-10-CM

## 2017-11-04 LAB — MAGNESIUM: MAGNESIUM: 1.6 mg/dL — AB (ref 1.7–2.4)

## 2017-11-04 MED ORDER — ONDANSETRON HCL 8 MG PO TABS: 8.0000 mg | ORAL_TABLET | Freq: Three times a day (TID) | ORAL | Status: DC | PRN

## 2017-11-04 MED ORDER — POTASSIUM CHLORIDE CRYS ER 20 MEQ PO TBCR
40.0000 meq | EXTENDED_RELEASE_TABLET | Freq: Two times a day (BID) | ORAL | Status: AC
  Administered 2017-11-04 (×2): 40 meq via ORAL
  Filled 2017-11-04 (×2): qty 2

## 2017-11-04 MED ORDER — PROCHLORPERAZINE MALEATE 10 MG PO TABS: 10.0000 mg | ORAL_TABLET | Freq: Three times a day (TID) | ORAL | Status: DC | PRN

## 2017-11-04 MED ORDER — KETOTIFEN FUMARATE 0.025 % OP SOLN
1.0000 [drp] | Freq: Two times a day (BID) | OPHTHALMIC | Status: DC
  Administered 2017-11-04 – 2017-11-05 (×4): 1 [drp] via OPHTHALMIC
  Filled 2017-11-04: qty 5

## 2017-11-04 MED ORDER — VITAMIN D 1000 UNITS PO TABS
2000.0000 [IU] | ORAL_TABLET | Freq: Every day | ORAL | Status: DC
  Administered 2017-11-04 – 2017-11-05 (×2): 2000 [IU] via ORAL
  Filled 2017-11-04 (×2): qty 2

## 2017-11-04 MED ORDER — LORAZEPAM 0.5 MG PO TABS
0.5000 mg | ORAL_TABLET | Freq: Three times a day (TID) | ORAL | Status: DC
  Filled 2017-11-04: qty 1

## 2017-11-04 MED ORDER — PANTOPRAZOLE SODIUM 40 MG PO TBEC: 40.0000 mg | DELAYED_RELEASE_TABLET | Freq: Two times a day (BID) | ORAL | Status: DC

## 2017-11-04 MED ORDER — LORAZEPAM 0.5 MG PO TABS
0.5000 mg | ORAL_TABLET | Freq: Three times a day (TID) | ORAL | Status: DC
  Administered 2017-11-04 – 2017-11-06 (×6): 0.5 mg via ORAL
  Filled 2017-11-04 (×6): qty 1

## 2017-11-04 MED ORDER — OLANZAPINE 5 MG PO TABS
10.0000 mg | ORAL_TABLET | Freq: Every day | ORAL | Status: DC
  Administered 2017-11-04 – 2017-11-05 (×2): 10 mg via ORAL
  Filled 2017-11-04 (×2): qty 2

## 2017-11-04 MED ORDER — RALOXIFENE HCL 60 MG PO TABS
60.0000 mg | ORAL_TABLET | Freq: Every day | ORAL | Status: DC
  Administered 2017-11-04 – 2017-11-06 (×3): 60 mg via ORAL
  Filled 2017-11-04 (×3): qty 1

## 2017-11-04 MED ORDER — LORATADINE 10 MG PO TABS
10.0000 mg | ORAL_TABLET | Freq: Every day | ORAL | Status: DC
  Administered 2017-11-04 – 2017-11-05 (×2): 10 mg via ORAL
  Filled 2017-11-04 (×2): qty 1

## 2017-11-04 MED ORDER — DEXTROSE 5 % IV SOLN
2.0000 g | Freq: Two times a day (BID) | INTRAVENOUS | Status: DC
  Administered 2017-11-04 – 2017-11-06 (×4): 2 g via INTRAVENOUS
  Filled 2017-11-04 (×5): qty 2

## 2017-11-04 MED ORDER — MAGNESIUM OXIDE 400 (241.3 MG) MG PO TABS
400.0000 mg | ORAL_TABLET | Freq: Two times a day (BID) | ORAL | Status: AC
Start: 2017-11-04 — End: 2017-11-04
  Administered 2017-11-04 (×2): 400 mg via ORAL
  Filled 2017-11-04 (×2): qty 1

## 2017-11-04 MED ORDER — SENNA 8.6 MG PO TABS
2.0000 | ORAL_TABLET | Freq: Two times a day (BID) | ORAL | Status: DC
  Administered 2017-11-04 – 2017-11-06 (×5): 17.2 mg via ORAL
  Filled 2017-11-04 (×5): qty 2

## 2017-11-04 MED ORDER — ASPIRIN EC 81 MG PO TBEC
81.0000 mg | DELAYED_RELEASE_TABLET | Freq: Every day | ORAL | Status: DC
  Administered 2017-11-04 – 2017-11-06 (×3): 81 mg via ORAL
  Filled 2017-11-04 (×3): qty 1

## 2017-11-04 MED ORDER — PROCHLORPERAZINE MALEATE 10 MG PO TABS: 10.0000 mg | ORAL_TABLET | Freq: Four times a day (QID) | ORAL | Status: DC | PRN

## 2017-11-04 MED ORDER — DEXTROSE 5 % IV SOLN
2.0000 g | Freq: Two times a day (BID) | INTRAVENOUS | Status: DC
  Filled 2017-11-04 (×2): qty 2

## 2017-11-04 NOTE — Progress Notes (Signed)
Pharmacy Antibiotic Note  Karen Vasquez is a 66 y.o. female admitted on 11/01/2017 with UTI.  Pharmacy has been consulted for Cefepime dosing.  Today, 11/04/2017: Day #3 Ceftriaxone for UTI Change from ceftriaxone to cefepime for pseudomonas and proteus in UCx Afebrile WBC 19.6 SCr 0.7, CrCl ~ 54 ml/min  Plan:  Cefepime 2g IV q12h  Follow up renal fxn, culture results, and clinical course.  Height: 5\' 2"  (157.5 cm) Weight: 132 lb 9.6 oz (60.1 kg) IBW/kg (Calculated) : 50.1  Temp (24hrs), Avg:98.3 F (36.8 C), Min:98.1 F (36.7 C), Max:98.4 F (36.9 C)  Recent Labs  Lab 11/01/17 2020 11/02/17 0630 11/03/17 0544  WBC 24.5*  --  19.6*  CREATININE 1.03* 0.83 0.70    Estimated Creatinine Clearance: 54 mL/min (by C-G formula based on SCr of 0.7 mg/dL).    Allergies  Allergen Reactions  . Prednisone Other (See Comments)    "Steroids give me chest pain"  . Fenofibrate Other (See Comments)    Muscle aches  . Quinolones Other (See Comments)    Wants to avoid due to possibility of tendon rupture.   . Statins Other (See Comments)    Muscle pain   . Septra [Sulfamethoxazole-Trimethoprim] Rash    Antimicrobials this admission: 12/20 CTX >> 12/22 12/22 Cefepime >>  Microbiology results: 12/20 Ucx: >100k Proteus, >100 Psuedomonas  Thank you for allowing pharmacy to be a part of this patient's care.  Gretta Arab PharmD, BCPS Pager [phone] 11/04/2017 1:39 PM

## 2017-11-04 NOTE — Progress Notes (Signed)
[facility] PROGRESS NOTE    Progress Note  Karen Vasquez  PHX:[id number] DOB: 11/22/1950 DOA: 11/01/2017 PCP: [name], MD     Brief Narrative:   Karen Vasquez is an 66 y.o. female with stage IV lung cancer on tried immunotherapy at [facility] to the ED with nausea and vomiting progressive over the past 2 weeks and one episode of diarrhea. Denied any fevers, chills, abdominal pain, dysuria, hematemesis, melena, bright red blood per rectum. Denies recent results of antibiotic. Reports very poor by mouth intake and generalized weakness.    Assessment/Plan:   Sepsis possibly due to UTI: Started empirically on IV antibiotics culture data is pending. His physiology has resolved.  Can be discharged on empiric antibiotics home tomorrow morning once urine cultures are completed.  Nausea vomiting and diarrhea Likely due to UTI now resolved tolerating her diet. Resume her Compazine, Zofran and Zyprexa which she takes at bedtime I will ask pharmacy to review her med rec as she relates she is on Zyprexa going back the records from [facility] on November 01, 2017 her medications were reviewed at this facility which show she is taking Zyprexa 10 mg at bedtime.  Chronic pain: Continue current home medication.  Adenocarcinoma of left lung stage IV: Continue follow-up with [facility] on immunotherapy.  Normochromic normocytic anemia Stable follow-up as an outpatient.  Malnutrition of moderate degree Ensure 3 times daily as needed.  Hypomagnesemia.  DVT prophylaxis: lovenox Family Communication:none Disposition Plan/Barrier to D/C: home in am Code Status:     Code Status Orders  (From admission, onward)        Start     Ordered   11/02/17 0400  Full code  Continuous     11/02/17 0400    Code Status History    Date Active Date Inactive Code Status Order ID Comments User Context   This patient has a current code status but no historical code status.    Advance Directive  Documentation     Most Recent Value  Type of Advance Directive  Healthcare Power of Attorney, Living will  Pre-existing out of facility DNR order (yellow form or pink MOST form)  No data  "MOST" Form in Place?  No data        IV Access:    Peripheral IV   Procedures and diagnostic studies:   No results found.   Medical Consultants:    None.  Anti-Infectives:   IV Rocephin.  Subjective:    Karen Vasquez she relates her vomitting is better she is tolerating her diet still with some nausea  Objective:    Vitals:   11/02/17 2035 11/03/17 0425 11/03/17 1923 11/04/17 0424  BP: 103/73 116/67 108/64 123/79  Pulse: 86 88 83 79  Resp: 20 16 16 18   Temp: 99.3 F (37.4 C) 98.4 F (36.9 C) 98.4 F (36.9 C) 98.1 F (36.7 C)  TempSrc: Oral Oral Oral Oral  SpO2: 95% 97% 99% 100%  Weight:  60.1 kg (132 lb 9.6 oz)    Height:        Intake/Output Summary (Last 24 hours) at 11/04/2017 0165 Last data filed at 11/04/2017 0332 Gross per 24 hour  Intake 340 ml  Output -  Net 340 ml   Filed Weights   11/01/17 1814 11/02/17 1140 11/03/17 0425  Weight: 59 kg (130 lb) 59 kg (130 lb) 60.1 kg (132 lb 9.6 oz)    Exam: General exam: In no acute distress. Respiratory system: Good air movement  and clear to auscultation. Cardiovascular system: S1 & S2 heard, RRR.  Gastrointestinal system: Abdomen is nondistended, soft and nontender.  Central nervous system: Alert and oriented. No focal neurological deficits. Extremities: No pedal edema. Skin: No rashes, lesions or ulcers   Data Reviewed:    Labs: Basic Metabolic Panel: Recent Labs  Lab 11/01/17 2020 11/02/17 0630 11/03/17 0544  NA 134* 136 135  K 3.8 3.7 3.4*  CL 102 105 105  CO2 22 23 24   GLUCOSE 98 95 107*  BUN 14 10 6   CREATININE 1.03* 0.83 0.70  CALCIUM 8.9 8.5* 8.4*  MG 1.6*  --   --    GFR Estimated Creatinine Clearance: 54 mL/min (by C-G formula based on SCr of 0.7 mg/dL). Liver Function  Tests: Recent Labs  Lab 11/01/17 2020 11/02/17 0630  AST 18 15  ALT 8* 9*  ALKPHOS 121 103  BILITOT 0.4 0.4  PROT 6.7 6.1*  ALBUMIN 3.2* 2.8*   Recent Labs  Lab 11/01/17 2020  LIPASE 23   No results for input(s): AMMONIA in the last 168 hours. Coagulation profile No results for input(s): INR, PROTIME in the last 168 hours.  CBC: Recent Labs  Lab 11/01/17 2020 11/03/17 0544  WBC 24.5* 19.6*  NEUTROABS 18.3*  --   HGB 9.7* 8.9*  HCT 29.8* 27.0*  MCV 84.2 84.4  PLT 388 334   Cardiac Enzymes: No results for input(s): CKTOTAL, CKMB, CKMBINDEX, TROPONINI in the last 168 hours. BNP (last 3 results) No results for input(s): PROBNP in the last 8760 hours. CBG: No results for input(s): GLUCAP in the last 168 hours. D-Dimer: No results for input(s): DDIMER in the last 72 hours. Hgb A1c: No results for input(s): HGBA1C in the last 72 hours. Lipid Profile: No results for input(s): CHOL, HDL, LDLCALC, TRIG, CHOLHDL, LDLDIRECT in the last 72 hours. Thyroid function studies: No results for input(s): TSH, T4TOTAL, T3FREE, THYROIDAB in the last 72 hours.  Invalid input(s): FREET3 Anemia work up: No results for input(s): VITAMINB12, FOLATE, FERRITIN, TIBC, IRON, RETICCTPCT in the last 72 hours. Sepsis Labs: Recent Labs  Lab 11/01/17 2020 11/03/17 0544  WBC 24.5* 19.6*   Microbiology Recent Results (from the past 240 hour(s))  Urine culture     Status: None (Preliminary result)   Collection Time: 11/02/17  2:09 AM  Result Value Ref Range Status   Specimen Description URINE, RANDOM  Final   Special Requests NONE  Final   Culture   Final    CULTURE REINCUBATED FOR BETTER GROWTH Performed at [facility], [place] [address], [place], [place] [address]    Report Status PENDING  Incomplete     Medications:   . acidophilus  2 capsule Oral TID  . enoxaparin (LOVENOX) injection  40 mg Subcutaneous Q24H  . omega-3 acid ethyl esters  2 g Oral BID  . oxyCODONE  20 mg  Oral Q12H  . pantoprazole  40 mg Oral BID  . senna-docusate  2 tablet Oral QHS   Continuous Infusions: . cefTRIAXone (ROCEPHIN)  IV Stopped (11/03/17 1232)      LOS: 2 days   Charlynne Cousins  [facility] Pager [phone]  *Please refer to [email or web address], password [email or web address] to get updated schedule on who will round on this patient, as hospitalists switch teams weekly. If 7PM-7AM, please contact night-coverage at [email or web address], password [email or web address] for any overnight needs.  11/04/2017, 8:12 AM

## 2017-11-05 DIAGNOSIS — R197 Diarrhea, unspecified: Secondary | ICD-10-CM

## 2017-11-05 DIAGNOSIS — R112 Nausea with vomiting, unspecified: Secondary | ICD-10-CM

## 2017-11-05 DIAGNOSIS — D72829 Elevated white blood cell count, unspecified: Secondary | ICD-10-CM

## 2017-11-05 LAB — BASIC METABOLIC PANEL
ANION GAP: 7 (ref 5–15)
BUN: 9 mg/dL (ref 6–20)
CALCIUM: 8.8 mg/dL — AB (ref 8.9–10.3)
CO2: 25 mmol/L (ref 22–32)
Chloride: 103 mmol/L (ref 101–111)
Creatinine, Ser: 0.73 mg/dL (ref 0.44–1.00)
GFR calc non Af Amer: >60 mL/min (ref >60)
Glucose, Bld: 86 mg/dL (ref 65–99)
Potassium: 4.9 mmol/L (ref 3.5–5.1)
SODIUM: 135 mmol/L (ref 135–145)

## 2017-11-05 LAB — CBC WITH DIFFERENTIAL/PLATELET
BASOS ABS: 0 10*3/uL (ref 0.0–0.1)
Basophils Relative: 0 %
Eosinophils Absolute: 2.2 10*3/uL — ABNORMAL HIGH (ref 0.0–0.7)
Eosinophils Relative: 9 %
HEMATOCRIT: 30.2 % — AB (ref 36.0–46.0)
HEMOGLOBIN: 9.9 g/dL — AB (ref 12.0–15.0)
LYMPHS ABS: 2.1 10*3/uL (ref 0.7–4.0)
LYMPHS PCT: 9 %
MCH: 27.9 pg (ref 26.0–34.0)
MCHC: 32.8 g/dL (ref 30.0–36.0)
MCV: 85.1 fL (ref 78.0–100.0)
Monocytes Absolute: 1.8 10*3/uL — ABNORMAL HIGH (ref 0.1–1.0)
Monocytes Relative: 7 %
NEUTROS ABS: 18.2 10*3/uL — AB (ref 1.7–7.7)
Neutrophils Relative %: 75 %
Platelets: 387 10*3/uL (ref 150–400)
RBC: 3.55 MIL/uL — AB (ref 3.87–5.11)
RDW: 16.1 % — ABNORMAL HIGH (ref 11.5–15.5)
WBC: 24.3 10*3/uL — AB (ref 4.0–10.5)

## 2017-11-05 NOTE — Progress Notes (Addendum)
PROGRESS NOTE    Karen Vasquez  PIR:[id number] DOB: [birth year] DOA: 11/01/2017 PCP: [name], MD   Brief Narrative:  Patient is a 66 year old female with past medical history of stage IV lung cancer on immunotherapy at [facility] who presented to the emergency department with complaints of nausea and vomiting.  Patient also reported poor oral intake and generalized weakness she was found to have urinary tract infection on presentation.  Assessment & Plan:   Principal Problem:   Nausea vomiting and diarrhea Active Problems:   Adenocarcinoma of left lung, stage 4 (HCC)   Normochromic normocytic anemia   UTI (urinary tract infection)   Malnutrition of moderate degree   Sepsis (HCC)   Nausea, vomiting and diarrhea: Resolved  Urinary tract infection: Urine culture grew Proteus and Pseudomonas.  Currently on cefepime.  Will follow final sensitivity report before discharge.  Adenocarcinoma of the left lung, stage IV: Follows with [facility].  On immunotherapy.  Chronic pain syndrome: Continue current medications.   Normocytic normochromic anemia: Currently H&H is stable.  Follow as an outpatient.  Malnutrition: Moderate degree.  On Ensure 3 times daily.  Leukocytosis: Appears chronic.  Follow-up as an outpatient  Hypomagnesemia :On supplementation    DVT prophylaxis:  Lovenox Code Status: Full Family Communication: None Disposition Plan: Home tomorrow   Consultants: None  Procedures: None  Antimicrobials: Cefepime   Subjective:   Objective: Vitals:   11/04/17 1415 11/04/17 2103 11/05/17 0502 11/05/17 1420  BP: 101/64 112/70 97/62 104/61  Pulse: 90 94 88 97  Resp: 16 16 16 16   Temp: 99.1 F (37.3 C) 98.7 F (37.1 C) 98.7 F (37.1 C) 98.4 F (36.9 C)  TempSrc: Oral Oral Oral Oral  SpO2: 97% 96% 95% 97%  Weight:   60.4 kg (133 lb 1.6 oz)   Height:        Intake/Output Summary (Last 24 hours) at 11/05/2017 1540 Last data filed at 11/05/2017 1300 Gross per 24  hour  Intake 650 ml  Output -  Net 650 ml   Filed Weights   11/02/17 1140 11/03/17 0425 11/05/17 0502  Weight: 59 kg (130 lb) 60.1 kg (132 lb 9.6 oz) 60.4 kg (133 lb 1.6 oz)    Examination:  General exam: Appears calm and comfortable ,Not in distress,average built Respiratory system: Bilateral equal air entry, normal vesicular breath sounds, no wheezes or crackles  Cardiovascular system: S1 & S2 heard, RRR. No JVD, murmurs, rubs, gallops or clicks. No pedal edema. Gastrointestinal system: Abdomen is nondistended, soft and nontender. No organomegaly or masses felt. Normal bowel sounds heard. Central nervous system: Alert and oriented. No focal neurological deficits. Extremities: No edema, no clubbing ,no cyanosis, distal peripheral pulses palpable. Skin: No rashes, lesions or ulcers,no icterus ,no pallor Psychiatry: Judgement and insight appear normal. Mood & affect appropriate.     Data Reviewed: I have personally reviewed following labs and imaging studies  CBC: Recent Labs  Lab 11/01/17 2020 11/03/17 0544 11/05/17 1025  WBC 24.5* 19.6* 24.3*  NEUTROABS 18.3*  --  18.2*  HGB 9.7* 8.9* 9.9*  HCT 29.8* 27.0* 30.2*  MCV 84.2 84.4 85.1  PLT 388 334 630   Basic Metabolic Panel: Recent Labs  Lab 11/01/17 2020 11/02/17 0630 11/03/17 0544 11/04/17 1025 11/05/17 0500  NA 134* 136 135  --  135  K 3.8 3.7 3.4*  --  4.9  CL 102 105 105  --  103  CO2 22 23 24   --  25  GLUCOSE 98 95  107*  --  86  BUN 14 10 6   --  9  CREATININE 1.03* 0.83 0.70  --  0.73  CALCIUM 8.9 8.5* 8.4*  --  8.8*  MG 1.6*  --   --  1.6*  --    GFR: Estimated Creatinine Clearance: 58.4 mL/min (by C-G formula based on SCr of 0.73 mg/dL). Liver Function Tests: Recent Labs  Lab 11/01/17 2020 11/02/17 0630  AST 18 15  ALT 8* 9*  ALKPHOS 121 103  BILITOT 0.4 0.4  PROT 6.7 6.1*  ALBUMIN 3.2* 2.8*   Recent Labs  Lab 11/01/17 2020  LIPASE 23   No results for input(s): AMMONIA in the last 168  hours. Coagulation Profile: No results for input(s): INR, PROTIME in the last 168 hours. Cardiac Enzymes: No results for input(s): CKTOTAL, CKMB, CKMBINDEX, TROPONINI in the last 168 hours. BNP (last 3 results) No results for input(s): PROBNP in the last 8760 hours. HbA1C: No results for input(s): HGBA1C in the last 72 hours. CBG: No results for input(s): GLUCAP in the last 168 hours. Lipid Profile: No results for input(s): CHOL, HDL, LDLCALC, TRIG, CHOLHDL, LDLDIRECT in the last 72 hours. Thyroid Function Tests: No results for input(s): TSH, T4TOTAL, FREET4, T3FREE, THYROIDAB in the last 72 hours. Anemia Panel: No results for input(s): VITAMINB12, FOLATE, FERRITIN, TIBC, IRON, RETICCTPCT in the last 72 hours. Sepsis Labs: No results for input(s): PROCALCITON, LATICACIDVEN in the last 168 hours.  Recent Results (from the past 240 hour(s))  Urine culture     Status: Abnormal (Preliminary result)   Collection Time: 11/02/17  2:09 AM  Result Value Ref Range Status   Specimen Description URINE, RANDOM  Final   Special Requests NONE  Final   Culture (A)  Final    >=100,000 COLONIES/mL PROTEUS MIRABILIS >=100,000 COLONIES/mL PSEUDOMONAS AERUGINOSA REPEATING SENSITIVITES Performed at [facility], [address] [place], [place] [address]    Report Status PENDING  Incomplete         Radiology Studies: No results found.      Scheduled Meds: . acidophilus  2 capsule Oral TID  . aspirin EC  81 mg Oral Daily  . cholecalciferol  2,000 Units Oral QHS  . enoxaparin (LOVENOX) injection  40 mg Subcutaneous Q24H  . ketotifen  1 drop Both Eyes BID  . loratadine  10 mg Oral QHS  . LORazepam  0.5 mg Oral Q8H  . OLANZapine  10 mg Oral QHS  . omega-3 acid ethyl esters  2 g Oral BID  . oxyCODONE  20 mg Oral Q12H  . pantoprazole  40 mg Oral BID  . raloxifene  60 mg Oral Daily  . senna  2 tablet Oral BID   Continuous Infusions: . ceFEPime (MAXIPIME) IV Stopped (11/05/17  1038)     LOS: 3 days    Time spent: 25 mins    Sherry Rogus Jodie Echevaria, MD [facility] Pager [phone]  If 7PM-7AM, please contact night-coverage [email or web address] Password [email or web address] 11/05/2017, 3:40 PM

## 2017-11-06 LAB — MAGNESIUM: Magnesium: 1.5 mg/dL — ABNORMAL LOW (ref 1.7–2.4)

## 2017-11-06 LAB — CBC WITH DIFFERENTIAL/PLATELET
BASOS ABS: 0 10*3/uL (ref 0.0–0.1)
Basophils Relative: 0 %
Eosinophils Absolute: 2.1 10*3/uL — ABNORMAL HIGH (ref 0.0–0.7)
Eosinophils Relative: 10 %
HEMATOCRIT: 26.4 % — AB (ref 36.0–46.0)
Hemoglobin: 8.7 g/dL — ABNORMAL LOW (ref 12.0–15.0)
LYMPHS ABS: 2 10*3/uL (ref 0.7–4.0)
LYMPHS PCT: 9 %
MCH: 27.8 pg (ref 26.0–34.0)
MCHC: 33 g/dL (ref 30.0–36.0)
MCV: 84.3 fL (ref 78.0–100.0)
MONO ABS: 1.7 10*3/uL — AB (ref 0.1–1.0)
Monocytes Relative: 8 %
NEUTROS ABS: 16 10*3/uL — AB (ref 1.7–7.7)
Neutrophils Relative %: 73 %
Platelets: 331 10*3/uL (ref 150–400)
RBC: 3.13 MIL/uL — AB (ref 3.87–5.11)
RDW: 16.1 % — AB (ref 11.5–15.5)
WBC: 21.8 10*3/uL — ABNORMAL HIGH (ref 4.0–10.5)

## 2017-11-06 MED ORDER — HEPARIN SOD (PORK) LOCK FLUSH 100 UNIT/ML IV SOLN
500.0000 [IU] | Freq: Once | INTRAVENOUS | Status: AC
  Administered 2017-11-06: 500 [IU] via INTRAVENOUS
  Filled 2017-11-06: qty 5

## 2017-11-06 MED ORDER — MAGNESIUM OXIDE 400 (241.3 MG) MG PO TABS: 400.0000 mg | ORAL_TABLET | Freq: Every day | ORAL | 0 refills | Status: AC

## 2017-11-06 MED ORDER — LEVOFLOXACIN 500 MG PO TABS: 500.0000 mg | ORAL_TABLET | Freq: Every day | ORAL | 0 refills | Status: AC

## 2017-11-06 MED ORDER — MAGNESIUM SULFATE 2 GM/50ML IV SOLN
2.0000 g | Freq: Once | INTRAVENOUS | Status: AC
  Administered 2017-11-06: 2 g via INTRAVENOUS
  Filled 2017-11-06: qty 50

## 2017-11-06 MED ORDER — MAGNESIUM OXIDE 400 (241.3 MG) MG PO TABS
400.0000 mg | ORAL_TABLET | Freq: Every day | ORAL | Status: DC
  Administered 2017-11-06: 400 mg via ORAL
  Filled 2017-11-06: qty 1

## 2017-11-06 MED ORDER — LEVOFLOXACIN 500 MG PO TABS: 500.0000 mg | ORAL_TABLET | Freq: Every day | ORAL | Status: DC

## 2017-11-06 NOTE — Progress Notes (Signed)
Pt discharged home. Discharge education complete, all belongings and paper rx sent with pt.

## 2017-11-06 NOTE — Discharge Summary (Signed)
Physician Discharge Summary  Karen Vasquez ZGY:[id number] DOB: 01/11/1951 DOA: 11/01/2017  PCP: [name], MD  Admit date: 11/01/2017 Discharge date: 11/06/2017  Admitted From: Home Disposition:  Home Recommendations for Outpatient Follow-up:  1. Follow up with PCP in 1-2 weeks 2. Please obtain Magnesium/BMP/CBC in one week. 3.  Home Health:No Equipment/Devices:None  Discharge Condition:Stable CODE STATUS: Full Diet recommendation: Heart Healthy Brief/Interim Summary:  Patient is a 66 year old female with past medical history of stage IV lung cancer on immunotherapy at [facility] who presented to the emergency department with complaints of nausea and vomiting for last 2 weeks. Patient also reported poor oral intake and generalized weakness. She was found to have urinary tract infection on presentation. Patient was started on IV antibiotics and IV fluids during her hospitalization.  Her overall general condition gradually improved.  Urine culture was sent and it showed Proteus and Pseudomonas, the final sensitivity report is still pending.  Patient remained afebrile.  She has chronic leukocytosis.  Her nausea and vomiting improved.  She was on cefepime while she was hospitalized here.  We have changed antibiotics to Levaquin on discharge. She will follow-up with her primary care physician in a week and her oncologist at [facility].  Following problems were addressed during hospitalization:   Nausea, vomiting and diarrhea: Resolved  Urinary tract infection: Urine culture grew   Proteus and Pseudomonas. Was on cefepime while being admitted. Final sensitivity report still pending.  Since her general condition improved, will discharge her on Levaquin.  Adenocarcinoma of the left lung, stage IV: Follows with [facility].  On immunotherapy.  Chronic pain syndrome: Continue current medications.  Normocytic normochromic anemia: Currently H&H is stable.  Follow as an outpatient.  Malnutrition: Moderate  degree.  Was on Ensure 3 times daily.  Leukocytosis: Appears chronic.  Follow-up as an outpatient  Hypomagnesemia :Supplementation started.     Discharge Diagnoses:  Principal Problem:   Nausea vomiting and diarrhea Active Problems:   Adenocarcinoma of left lung, stage 4 (HCC)   Normochromic normocytic anemia   UTI (urinary tract infection)   Malnutrition of moderate degree   Sepsis ([place])   Leucocytosis    Discharge Instructions   Allergies as of 11/06/2017      Reactions   Prednisone Other (See Comments)   "Steroids give me chest pain"   Fenofibrate Other (See Comments)   Muscle aches   Quinolones Other (See Comments)   Wants to avoid due to possibility of tendon rupture.    Statins Other (See Comments)   Muscle pain    Septra [sulfamethoxazole-trimethoprim] Rash      Medication List    STOP taking these medications   trimethoprim 100 MG tablet Commonly known as:  TRIMPEX     TAKE these medications   aspirin EC 81 MG tablet Take 81 mg by mouth daily.   conjugated estrogens vaginal cream Commonly known as:  PREMARIN Place 1 Applicatorful vaginally daily.   Cranberry 500 MG Tabs Take 500 mg by mouth daily.   ezetimibe 10 MG tablet Commonly known as:  ZETIA Take 10 mg by mouth at bedtime.   ketotifen 0.025 % ophthalmic solution Commonly known as:  ZADITOR Place 1 drop into both eyes 2 (two) times daily.   levofloxacin 500 MG tablet Commonly known as:  LEVAQUIN Take 1 tablet (500 mg total) by mouth daily. Start taking on:  11/07/2017   lidocaine-prilocaine cream Commonly known as:  EMLA Apply 1 application topically as needed. Apply 1-2 tsp 1-2 hours prior to  labs or chemotherapy   loratadine 10 MG tablet Commonly known as:  CLARITIN Take 10 mg by mouth at bedtime.   LORazepam 0.5 MG tablet Commonly known as:  ATIVAN Take 1 tablet (0.5 mg total) by mouth every 8 (eight) hours.   LORazepam 0.5 MG tablet Commonly known as:  ATIVAN 1  tablet po 30 minutes prior to radiation or MRI   magnesium oxide 400 (241.3 Mg) MG tablet Commonly known as:  MAG-OX Take 1 tablet (400 mg total) by mouth daily. Start taking on:  11/07/2017   multivitamin tablet Take 1 tablet by mouth daily.   OLANZapine 10 MG tablet Commonly known as:  ZYPREXA Take 10 mg by mouth at bedtime.   ondansetron 8 MG tablet Commonly known as:  ZOFRAN Take 1 tablet (8 mg total) by mouth every 8 (eight) hours as needed for nausea or vomiting.   oxyCODONE 5 MG immediate release tablet Commonly known as:  Oxy IR/ROXICODONE Take 1-3 tablets (5-15 mg total) by mouth every 4 (four) hours as needed for severe pain.   OXYCONTIN 20 mg 12 hr tablet Generic drug:  oxyCODONE Take 20 mg by mouth 2 (two) times daily.   pantoprazole 40 MG tablet Commonly known as:  PROTONIX Take 40 mg by mouth 2 (two) times daily before a meal.   prochlorperazine 10 MG tablet Commonly known as:  COMPAZINE TAKE ONE TABLET BY MOUTH EVERY 6 HOURS AS NEEDED FOR NAUSEA/VOMITING   raloxifene 60 MG tablet Commonly known as:  EVISTA Take 60 mg by mouth daily.   sodium chloride 0.65 % nasal spray Commonly known as:  OCEAN Place 1 spray into the nose daily as needed for congestion.   temazepam 30 MG capsule Commonly known as:  RESTORIL Take 1 capsule (30 mg total) by mouth at bedtime as needed for sleep.   VASCEPA 1 g Caps Generic drug:  Icosapent Ethyl Take 2 g by mouth 2 (two) times daily.   Vitamin D 2000 units tablet Take 2,000 Units by mouth at bedtime.      Follow-up Information    [facility] DEPT Follow up.   Specialty:  Emergency Medicine Why:  As needed Contact information: [place] [id number] [place] [phone]       Lissa Morales, MD. Go in 1 week(s).   Specialty:  Internal Medicine Why:  As scheduled for reevaluation Contact information: [place] [address] [phone]          Allergies  Allergen Reactions  . Prednisone Other (See Comments)    "Steroids give me chest pain"  . Fenofibrate Other (See Comments)    Muscle aches  . Quinolones Other (See Comments)    Wants to avoid due to possibility of tendon rupture.   . Statins Other (See Comments)    Muscle pain   . Septra [Sulfamethoxazole-Trimethoprim] Rash    Consultations:     Procedures/Studies:  No results found.    Subjective: Patient seen and examined the bedside .Feels comfortable.  No new complaints.  Nausea and vomiting has improved.  Denies any dysuria.   Discharge Exam: Vitals:   11/05/17 2044 11/06/17 0440  BP: 96/84 94/60  Pulse: 96 89  Resp: 16 14  Temp: 100 F (37.8 C) 98.9 F (37.2 C)  SpO2: 98% 93%   Vitals:   11/05/17 0502 11/05/17 1420 11/05/17 2044 11/06/17 0440  BP: 97/62 104/61 96/84 94/60   Pulse: 88 97 96 89  Resp: 16 16 16 14   Temp: 98.7 F (37.1 C) 98.4 F (36.9 C) 100 F (37.8 C) 98.9 F (37.2 C)  TempSrc: Oral Oral Oral Oral  SpO2: 95% 97% 98% 93%  Weight: 60.4 kg (133 lb 1.6 oz)   60.6 kg (133 lb 9.6 oz)  Height:        General: Pt is alert, awake, not in acute distress Cardiovascular: RRR, S1/S2 +, no rubs, no gallops Respiratory: CTA bilaterally, no wheezing, no rhonchi Abdominal: Soft, NT, ND, bowel sounds + Extremities: no edema, no cyanosis    The results of significant diagnostics from this hospitalization (including imaging, microbiology, ancillary and laboratory) are listed below for reference.     Microbiology: Recent Results (from the past 240 hour(s))  Urine culture     Status: Abnormal (Preliminary result)   Collection Time: 11/02/17  2:09 AM  Result Value Ref Range Status   Specimen Description URINE, RANDOM  Final   Special Requests NONE  Final   Culture (A)  Final    >=100,000 COLONIES/mL PROTEUS MIRABILIS >=100,000 COLONIES/mL PSEUDOMONAS AERUGINOSA REPEATING SENSITIVITES Performed  at [facility], [address], [place], [place] [address]    Report Status PENDING  Incomplete     Labs: BNP (last 3 results) No results for input(s): BNP in the last 8760 hours. Basic Metabolic Panel: Recent Labs  Lab 11/01/17 2020 11/02/17 0630 11/03/17 0544 11/04/17 1025 11/05/17 0500 11/06/17 0605  NA 134* 136 135  --  135  --   K 3.8 3.7 3.4*  --  4.9  --   CL 102 105 105  --  103  --   CO2 22 23 24   --  25  --   GLUCOSE 98 95 107*  --  86  --   BUN 14 10 6   --  9  --   CREATININE 1.03* 0.83 0.70  --  0.73  --   CALCIUM 8.9 8.5* 8.4*  --  8.8*  --   MG 1.6*  --   --  1.6*  --  1.5*   Liver Function Tests: Recent Labs  Lab 11/01/17 2020 11/02/17 0630  AST 18 15  ALT 8* 9*  ALKPHOS 121 103  BILITOT 0.4 0.4  PROT 6.7 6.1*  ALBUMIN 3.2* 2.8*   Recent Labs  Lab 11/01/17 2020  LIPASE 23   No results for input(s): AMMONIA in the last 168 hours. CBC: Recent Labs  Lab 11/01/17 2020 11/03/17 0544 11/05/17 1025 11/06/17 0605  WBC 24.5* 19.6* 24.3* 21.8*  NEUTROABS 18.3*  --  18.2* 16.0*  HGB 9.7* 8.9* 9.9* 8.7*  HCT 29.8* 27.0* 30.2* 26.4*  MCV 84.2 84.4 85.1 84.3  PLT 388 334 387 331   Cardiac Enzymes: No results for input(s): CKTOTAL, CKMB, CKMBINDEX, TROPONINI in the last 168 hours. BNP: Invalid input(s): POCBNP CBG: No results for input(s): GLUCAP in the last 168 hours. D-Dimer No results for input(s): DDIMER in the last 72 hours. Hgb A1c No results for input(s): HGBA1C in the last 72 hours. Lipid Profile No results for input(s): CHOL, HDL, LDLCALC, TRIG, CHOLHDL, LDLDIRECT in the last 72 hours. Thyroid function studies No results for input(s): TSH, T4TOTAL, T3FREE, THYROIDAB in the last 72 hours.  Invalid input(s): FREET3 Anemia work up No results for input(s): VITAMINB12, FOLATE, FERRITIN, TIBC, IRON, RETICCTPCT in the last 72 hours. Urinalysis    Component Value Date/Time   COLORURINE YELLOW 11/02/2017 0133   APPEARANCEUR  CLOUDY (A) 11/02/2017  0133   LABSPEC 1.011 11/02/2017 0133   PHURINE 7.0 11/02/2017 0133   GLUCOSEU NEGATIVE 11/02/2017 0133   HGBUR MODERATE (A) 11/02/2017 0133   BILIRUBINUR NEGATIVE 11/02/2017 0133   KETONESUR 20 (A) 11/02/2017 0133   PROTEINUR NEGATIVE 11/02/2017 0133   UROBILINOGEN 0.2 11/01/2007 1024   NITRITE POSITIVE (A) 11/02/2017 0133   LEUKOCYTESUR LARGE (A) 11/02/2017 0133   Sepsis Labs Invalid input(s): PROCALCITONIN,  WBC,  LACTICIDVEN Microbiology Recent Results (from the past 240 hour(s))  Urine culture     Status: Abnormal (Preliminary result)   Collection Time: 11/02/17  2:09 AM  Result Value Ref Range Status   Specimen Description URINE, RANDOM  Final   Special Requests NONE  Final   Culture (A)  Final    >=100,000 COLONIES/mL PROTEUS MIRABILIS >=100,000 COLONIES/mL PSEUDOMONAS AERUGINOSA REPEATING SENSITIVITES Performed at [facility], [place] [address], [place], [place] [address]    Report Status PENDING  Incomplete     Time coordinating discharge: Over 30 minutes  SIGNED:   Marene Lenz, MD  [facility] 11/06/2017, 1:06 PM Pager [phone]  If 7PM-7AM, please contact night-coverage [email or web address] Password [email or web address]

## 2017-11-08 LAB — URINE CULTURE

## 2017-11-09 DIAGNOSIS — K769 Liver disease, unspecified: Secondary | ICD-10-CM | POA: Diagnosis not present

## 2017-11-09 DIAGNOSIS — T451X5A Adverse effect of antineoplastic and immunosuppressive drugs, initial encounter: Secondary | ICD-10-CM | POA: Diagnosis not present

## 2017-11-09 DIAGNOSIS — G9389 Other specified disorders of brain: Secondary | ICD-10-CM | POA: Diagnosis not present

## 2017-11-09 DIAGNOSIS — E43 Unspecified severe protein-calorie malnutrition: Secondary | ICD-10-CM | POA: Diagnosis not present

## 2017-11-09 DIAGNOSIS — E860 Dehydration: Secondary | ICD-10-CM | POA: Diagnosis not present

## 2017-11-09 DIAGNOSIS — C7951 Secondary malignant neoplasm of bone: Secondary | ICD-10-CM | POA: Diagnosis not present

## 2017-11-09 DIAGNOSIS — C7931 Secondary malignant neoplasm of brain: Secondary | ICD-10-CM | POA: Diagnosis not present

## 2017-11-09 DIAGNOSIS — D72829 Elevated white blood cell count, unspecified: Secondary | ICD-10-CM | POA: Diagnosis not present

## 2017-11-09 DIAGNOSIS — C786 Secondary malignant neoplasm of retroperitoneum and peritoneum: Secondary | ICD-10-CM | POA: Diagnosis not present

## 2017-11-09 DIAGNOSIS — G936 Cerebral edema: Secondary | ICD-10-CM | POA: Diagnosis not present

## 2017-11-09 DIAGNOSIS — Z981 Arthrodesis status: Secondary | ICD-10-CM | POA: Diagnosis not present

## 2017-11-09 DIAGNOSIS — C787 Secondary malignant neoplasm of liver and intrahepatic bile duct: Secondary | ICD-10-CM | POA: Diagnosis not present

## 2017-11-09 DIAGNOSIS — K219 Gastro-esophageal reflux disease without esophagitis: Secondary | ICD-10-CM | POA: Diagnosis not present

## 2017-11-09 DIAGNOSIS — Z6823 Body mass index (BMI) 23.0-23.9, adult: Secondary | ICD-10-CM | POA: Diagnosis not present

## 2017-11-09 DIAGNOSIS — Z006 Encounter for examination for normal comparison and control in clinical research program: Secondary | ICD-10-CM | POA: Diagnosis not present

## 2017-11-09 DIAGNOSIS — J479 Bronchiectasis, uncomplicated: Secondary | ICD-10-CM | POA: Diagnosis not present

## 2017-11-09 DIAGNOSIS — R112 Nausea with vomiting, unspecified: Secondary | ICD-10-CM | POA: Diagnosis not present

## 2017-11-09 DIAGNOSIS — Z66 Do not resuscitate: Secondary | ICD-10-CM | POA: Diagnosis not present

## 2017-11-09 DIAGNOSIS — I10 Essential (primary) hypertension: Secondary | ICD-10-CM | POA: Diagnosis not present

## 2017-11-09 DIAGNOSIS — C3492 Malignant neoplasm of unspecified part of left bronchus or lung: Secondary | ICD-10-CM | POA: Diagnosis not present

## 2017-11-09 DIAGNOSIS — C7970 Secondary malignant neoplasm of unspecified adrenal gland: Secondary | ICD-10-CM | POA: Diagnosis not present

## 2017-11-09 DIAGNOSIS — R531 Weakness: Secondary | ICD-10-CM | POA: Diagnosis not present

## 2017-11-09 DIAGNOSIS — C3490 Malignant neoplasm of unspecified part of unspecified bronchus or lung: Secondary | ICD-10-CM | POA: Diagnosis not present

## 2017-11-10 DIAGNOSIS — C7951 Secondary malignant neoplasm of bone: Secondary | ICD-10-CM | POA: Diagnosis not present

## 2017-11-10 DIAGNOSIS — K769 Liver disease, unspecified: Secondary | ICD-10-CM | POA: Diagnosis not present

## 2017-11-10 DIAGNOSIS — J479 Bronchiectasis, uncomplicated: Secondary | ICD-10-CM | POA: Diagnosis not present

## 2017-11-10 DIAGNOSIS — G9389 Other specified disorders of brain: Secondary | ICD-10-CM | POA: Diagnosis not present

## 2017-11-10 DIAGNOSIS — C3492 Malignant neoplasm of unspecified part of left bronchus or lung: Secondary | ICD-10-CM | POA: Diagnosis not present

## 2017-11-10 DIAGNOSIS — C3490 Malignant neoplasm of unspecified part of unspecified bronchus or lung: Secondary | ICD-10-CM | POA: Diagnosis not present

## 2017-11-11 DIAGNOSIS — C3492 Malignant neoplasm of unspecified part of left bronchus or lung: Secondary | ICD-10-CM | POA: Diagnosis not present

## 2017-11-11 DIAGNOSIS — R112 Nausea with vomiting, unspecified: Secondary | ICD-10-CM | POA: Diagnosis not present

## 2017-11-11 DIAGNOSIS — R531 Weakness: Secondary | ICD-10-CM | POA: Diagnosis not present

## 2017-11-11 DIAGNOSIS — D72829 Elevated white blood cell count, unspecified: Secondary | ICD-10-CM | POA: Diagnosis not present

## 2017-11-13 ENCOUNTER — Telehealth: Payer: Self-pay | Admitting: Medical Oncology

## 2017-11-13 ENCOUNTER — Other Ambulatory Visit: Payer: BLUE CROSS/BLUE SHIELD

## 2017-11-13 DIAGNOSIS — Z8719 Personal history of other diseases of the digestive system: Secondary | ICD-10-CM | POA: Diagnosis not present

## 2017-11-13 DIAGNOSIS — C787 Secondary malignant neoplasm of liver and intrahepatic bile duct: Secondary | ICD-10-CM | POA: Diagnosis not present

## 2017-11-13 DIAGNOSIS — Z8739 Personal history of other diseases of the musculoskeletal system and connective tissue: Secondary | ICD-10-CM | POA: Diagnosis not present

## 2017-11-13 DIAGNOSIS — Z8679 Personal history of other diseases of the circulatory system: Secondary | ICD-10-CM | POA: Diagnosis not present

## 2017-11-13 DIAGNOSIS — C7970 Secondary malignant neoplasm of unspecified adrenal gland: Secondary | ICD-10-CM | POA: Diagnosis not present

## 2017-11-13 DIAGNOSIS — C3490 Malignant neoplasm of unspecified part of unspecified bronchus or lung: Secondary | ICD-10-CM | POA: Diagnosis not present

## 2017-11-13 NOTE — Telephone Encounter (Signed)
No longer going  to [facility]. Recent scans. Dr Emeterio Reeve NP said "The treatment is not working". She was admitted for dehydration , nausea, not keeping fluid down.  Now home . Dr Gaynell Face referred her to hospice who is seeing her today. Son did say pt would like to see Julien Nordmann 'for another opinion". Per Julien Nordmann he can see pt this Friday at 1100, no labs. Schedule request sent.

## 2017-11-14 DIAGNOSIS — Z87440 Personal history of urinary (tract) infections: Secondary | ICD-10-CM | POA: Diagnosis not present

## 2017-11-14 DIAGNOSIS — Z8719 Personal history of other diseases of the digestive system: Secondary | ICD-10-CM | POA: Diagnosis not present

## 2017-11-14 DIAGNOSIS — Z8679 Personal history of other diseases of the circulatory system: Secondary | ICD-10-CM | POA: Diagnosis not present

## 2017-11-14 DIAGNOSIS — C3490 Malignant neoplasm of unspecified part of unspecified bronchus or lung: Secondary | ICD-10-CM | POA: Diagnosis not present

## 2017-11-14 DIAGNOSIS — C787 Secondary malignant neoplasm of liver and intrahepatic bile duct: Secondary | ICD-10-CM | POA: Diagnosis not present

## 2017-11-14 DIAGNOSIS — Z8739 Personal history of other diseases of the musculoskeletal system and connective tissue: Secondary | ICD-10-CM | POA: Diagnosis not present

## 2017-11-14 DIAGNOSIS — C7970 Secondary malignant neoplasm of unspecified adrenal gland: Secondary | ICD-10-CM | POA: Diagnosis not present

## 2017-11-14 DIAGNOSIS — Z8639 Personal history of other endocrine, nutritional and metabolic disease: Secondary | ICD-10-CM | POA: Diagnosis not present

## 2017-11-15 DIAGNOSIS — Z8679 Personal history of other diseases of the circulatory system: Secondary | ICD-10-CM | POA: Diagnosis not present

## 2017-11-15 DIAGNOSIS — Z87440 Personal history of urinary (tract) infections: Secondary | ICD-10-CM | POA: Diagnosis not present

## 2017-11-15 DIAGNOSIS — C3490 Malignant neoplasm of unspecified part of unspecified bronchus or lung: Secondary | ICD-10-CM | POA: Diagnosis not present

## 2017-11-15 DIAGNOSIS — Z8639 Personal history of other endocrine, nutritional and metabolic disease: Secondary | ICD-10-CM | POA: Diagnosis not present

## 2017-11-15 DIAGNOSIS — Z8719 Personal history of other diseases of the digestive system: Secondary | ICD-10-CM | POA: Diagnosis not present

## 2017-11-15 DIAGNOSIS — C787 Secondary malignant neoplasm of liver and intrahepatic bile duct: Secondary | ICD-10-CM | POA: Diagnosis not present

## 2017-11-15 DIAGNOSIS — C7970 Secondary malignant neoplasm of unspecified adrenal gland: Secondary | ICD-10-CM | POA: Diagnosis not present

## 2017-11-15 DIAGNOSIS — Z8739 Personal history of other diseases of the musculoskeletal system and connective tissue: Secondary | ICD-10-CM | POA: Diagnosis not present

## 2017-11-16 DIAGNOSIS — C787 Secondary malignant neoplasm of liver and intrahepatic bile duct: Secondary | ICD-10-CM | POA: Diagnosis not present

## 2017-11-16 DIAGNOSIS — Z8639 Personal history of other endocrine, nutritional and metabolic disease: Secondary | ICD-10-CM | POA: Diagnosis not present

## 2017-11-16 DIAGNOSIS — Z8719 Personal history of other diseases of the digestive system: Secondary | ICD-10-CM | POA: Diagnosis not present

## 2017-11-16 DIAGNOSIS — Z8679 Personal history of other diseases of the circulatory system: Secondary | ICD-10-CM | POA: Diagnosis not present

## 2017-11-16 DIAGNOSIS — Z8739 Personal history of other diseases of the musculoskeletal system and connective tissue: Secondary | ICD-10-CM | POA: Diagnosis not present

## 2017-11-16 DIAGNOSIS — C3490 Malignant neoplasm of unspecified part of unspecified bronchus or lung: Secondary | ICD-10-CM | POA: Diagnosis not present

## 2017-11-16 DIAGNOSIS — Z87440 Personal history of urinary (tract) infections: Secondary | ICD-10-CM | POA: Diagnosis not present

## 2017-11-16 DIAGNOSIS — C7970 Secondary malignant neoplasm of unspecified adrenal gland: Secondary | ICD-10-CM | POA: Diagnosis not present

## 2017-11-17 ENCOUNTER — Encounter: Payer: Self-pay | Admitting: Internal Medicine

## 2017-11-17 ENCOUNTER — Ambulatory Visit: Payer: BLUE CROSS/BLUE SHIELD | Admitting: Internal Medicine

## 2017-11-17 DIAGNOSIS — C3490 Malignant neoplasm of unspecified part of unspecified bronchus or lung: Secondary | ICD-10-CM | POA: Diagnosis not present

## 2017-11-17 DIAGNOSIS — Z8739 Personal history of other diseases of the musculoskeletal system and connective tissue: Secondary | ICD-10-CM | POA: Diagnosis not present

## 2017-11-17 DIAGNOSIS — Z8639 Personal history of other endocrine, nutritional and metabolic disease: Secondary | ICD-10-CM | POA: Diagnosis not present

## 2017-11-17 DIAGNOSIS — C787 Secondary malignant neoplasm of liver and intrahepatic bile duct: Secondary | ICD-10-CM | POA: Diagnosis not present

## 2017-11-17 DIAGNOSIS — Z87440 Personal history of urinary (tract) infections: Secondary | ICD-10-CM | POA: Diagnosis not present

## 2017-11-17 DIAGNOSIS — C7970 Secondary malignant neoplasm of unspecified adrenal gland: Secondary | ICD-10-CM | POA: Diagnosis not present

## 2017-11-17 DIAGNOSIS — Z8719 Personal history of other diseases of the digestive system: Secondary | ICD-10-CM | POA: Diagnosis not present

## 2017-11-17 DIAGNOSIS — Z8679 Personal history of other diseases of the circulatory system: Secondary | ICD-10-CM | POA: Diagnosis not present

## 2017-11-18 DIAGNOSIS — C787 Secondary malignant neoplasm of liver and intrahepatic bile duct: Secondary | ICD-10-CM | POA: Diagnosis not present

## 2017-11-18 DIAGNOSIS — Z8739 Personal history of other diseases of the musculoskeletal system and connective tissue: Secondary | ICD-10-CM | POA: Diagnosis not present

## 2017-11-18 DIAGNOSIS — Z8639 Personal history of other endocrine, nutritional and metabolic disease: Secondary | ICD-10-CM | POA: Diagnosis not present

## 2017-11-18 DIAGNOSIS — C3490 Malignant neoplasm of unspecified part of unspecified bronchus or lung: Secondary | ICD-10-CM | POA: Diagnosis not present

## 2017-11-18 DIAGNOSIS — Z8679 Personal history of other diseases of the circulatory system: Secondary | ICD-10-CM | POA: Diagnosis not present

## 2017-11-18 DIAGNOSIS — Z87440 Personal history of urinary (tract) infections: Secondary | ICD-10-CM | POA: Diagnosis not present

## 2017-11-18 DIAGNOSIS — C7970 Secondary malignant neoplasm of unspecified adrenal gland: Secondary | ICD-10-CM | POA: Diagnosis not present

## 2017-11-18 DIAGNOSIS — Z8719 Personal history of other diseases of the digestive system: Secondary | ICD-10-CM | POA: Diagnosis not present

## 2017-11-19 DIAGNOSIS — C7970 Secondary malignant neoplasm of unspecified adrenal gland: Secondary | ICD-10-CM | POA: Diagnosis not present

## 2017-11-19 DIAGNOSIS — Z8739 Personal history of other diseases of the musculoskeletal system and connective tissue: Secondary | ICD-10-CM | POA: Diagnosis not present

## 2017-11-19 DIAGNOSIS — Z8639 Personal history of other endocrine, nutritional and metabolic disease: Secondary | ICD-10-CM | POA: Diagnosis not present

## 2017-11-19 DIAGNOSIS — C787 Secondary malignant neoplasm of liver and intrahepatic bile duct: Secondary | ICD-10-CM | POA: Diagnosis not present

## 2017-11-19 DIAGNOSIS — C3490 Malignant neoplasm of unspecified part of unspecified bronchus or lung: Secondary | ICD-10-CM | POA: Diagnosis not present

## 2017-11-19 DIAGNOSIS — Z87440 Personal history of urinary (tract) infections: Secondary | ICD-10-CM | POA: Diagnosis not present

## 2017-11-19 DIAGNOSIS — Z8719 Personal history of other diseases of the digestive system: Secondary | ICD-10-CM | POA: Diagnosis not present

## 2017-11-19 DIAGNOSIS — Z8679 Personal history of other diseases of the circulatory system: Secondary | ICD-10-CM | POA: Diagnosis not present

## 2017-11-20 DIAGNOSIS — Z8639 Personal history of other endocrine, nutritional and metabolic disease: Secondary | ICD-10-CM | POA: Diagnosis not present

## 2017-11-20 DIAGNOSIS — Z8719 Personal history of other diseases of the digestive system: Secondary | ICD-10-CM | POA: Diagnosis not present

## 2017-11-20 DIAGNOSIS — Z8679 Personal history of other diseases of the circulatory system: Secondary | ICD-10-CM | POA: Diagnosis not present

## 2017-11-20 DIAGNOSIS — C787 Secondary malignant neoplasm of liver and intrahepatic bile duct: Secondary | ICD-10-CM | POA: Diagnosis not present

## 2017-11-20 DIAGNOSIS — C3490 Malignant neoplasm of unspecified part of unspecified bronchus or lung: Secondary | ICD-10-CM | POA: Diagnosis not present

## 2017-11-20 DIAGNOSIS — C7970 Secondary malignant neoplasm of unspecified adrenal gland: Secondary | ICD-10-CM | POA: Diagnosis not present

## 2017-11-20 DIAGNOSIS — Z8739 Personal history of other diseases of the musculoskeletal system and connective tissue: Secondary | ICD-10-CM | POA: Diagnosis not present

## 2017-11-20 DIAGNOSIS — Z87440 Personal history of urinary (tract) infections: Secondary | ICD-10-CM | POA: Diagnosis not present

## 2017-11-21 DIAGNOSIS — C3490 Malignant neoplasm of unspecified part of unspecified bronchus or lung: Secondary | ICD-10-CM | POA: Diagnosis not present

## 2017-11-21 DIAGNOSIS — Z8639 Personal history of other endocrine, nutritional and metabolic disease: Secondary | ICD-10-CM | POA: Diagnosis not present

## 2017-11-21 DIAGNOSIS — C7970 Secondary malignant neoplasm of unspecified adrenal gland: Secondary | ICD-10-CM | POA: Diagnosis not present

## 2017-11-21 DIAGNOSIS — Z87440 Personal history of urinary (tract) infections: Secondary | ICD-10-CM | POA: Diagnosis not present

## 2017-11-21 DIAGNOSIS — Z8719 Personal history of other diseases of the digestive system: Secondary | ICD-10-CM | POA: Diagnosis not present

## 2017-11-21 DIAGNOSIS — Z8739 Personal history of other diseases of the musculoskeletal system and connective tissue: Secondary | ICD-10-CM | POA: Diagnosis not present

## 2017-11-21 DIAGNOSIS — C787 Secondary malignant neoplasm of liver and intrahepatic bile duct: Secondary | ICD-10-CM | POA: Diagnosis not present

## 2017-11-21 DIAGNOSIS — Z8679 Personal history of other diseases of the circulatory system: Secondary | ICD-10-CM | POA: Diagnosis not present

## 2017-11-22 ENCOUNTER — Telehealth: Payer: Self-pay | Admitting: Medical Oncology

## 2017-11-22 DIAGNOSIS — Z8719 Personal history of other diseases of the digestive system: Secondary | ICD-10-CM | POA: Diagnosis not present

## 2017-11-22 DIAGNOSIS — Z8739 Personal history of other diseases of the musculoskeletal system and connective tissue: Secondary | ICD-10-CM | POA: Diagnosis not present

## 2017-11-22 DIAGNOSIS — Z8639 Personal history of other endocrine, nutritional and metabolic disease: Secondary | ICD-10-CM | POA: Diagnosis not present

## 2017-11-22 DIAGNOSIS — C7970 Secondary malignant neoplasm of unspecified adrenal gland: Secondary | ICD-10-CM | POA: Diagnosis not present

## 2017-11-22 DIAGNOSIS — C3490 Malignant neoplasm of unspecified part of unspecified bronchus or lung: Secondary | ICD-10-CM | POA: Diagnosis not present

## 2017-11-22 DIAGNOSIS — C787 Secondary malignant neoplasm of liver and intrahepatic bile duct: Secondary | ICD-10-CM | POA: Diagnosis not present

## 2017-11-22 DIAGNOSIS — Z87440 Personal history of urinary (tract) infections: Secondary | ICD-10-CM | POA: Diagnosis not present

## 2017-11-22 DIAGNOSIS — Z8679 Personal history of other diseases of the circulatory system: Secondary | ICD-10-CM | POA: Diagnosis not present

## 2017-11-22 NOTE — Telephone Encounter (Signed)
-----   Message from Curt Bears, MD sent at 11/22/2017  1:51 PM EST ----- Regarding: RE: response requested I am sorry she is not doing well. I looked at her records from [place]. Options for treatment would be to continue with palliative care and hospice but if her performance status improved, she could benefit from treatment with either single agent Taxotere or a combination of Taxotere and Cyramza versus single agent gemcitabine. I would be more than happy to see the patient again if she can make it to the office and if she is still interested in proceeding with further treatment.  Otherwise I would definitely recommend to continue with palliative care and hospice. Thank you ----- Message ----- From: Ardeen Garland, RN Sent: 11/22/2017   8:48 AM To: Curt Bears, MD Subject: response requested                             ----- Message from [place], Generic sent at 11/17/2017 11:06 AM EST -----  So sorry we couldn't make Mom's appointment today. The things Mom was hoping to find out today: 1) are there any other treatment options for her that you would recommend? 2) would you recommend any additional medications to help palliate her?   Thank you!

## 2017-11-22 NOTE — Telephone Encounter (Signed)
Pt went  to [facility] today. Karen Vasquez appreciated Dr Worthy Flank feedback and it will give closure to her husband and family.

## 2017-11-23 DIAGNOSIS — C3490 Malignant neoplasm of unspecified part of unspecified bronchus or lung: Secondary | ICD-10-CM | POA: Diagnosis not present

## 2017-11-23 DIAGNOSIS — Z8639 Personal history of other endocrine, nutritional and metabolic disease: Secondary | ICD-10-CM | POA: Diagnosis not present

## 2017-11-23 DIAGNOSIS — Z8739 Personal history of other diseases of the musculoskeletal system and connective tissue: Secondary | ICD-10-CM | POA: Diagnosis not present

## 2017-11-23 DIAGNOSIS — Z8679 Personal history of other diseases of the circulatory system: Secondary | ICD-10-CM | POA: Diagnosis not present

## 2017-11-23 DIAGNOSIS — Z87440 Personal history of urinary (tract) infections: Secondary | ICD-10-CM | POA: Diagnosis not present

## 2017-11-23 DIAGNOSIS — Z8719 Personal history of other diseases of the digestive system: Secondary | ICD-10-CM | POA: Diagnosis not present

## 2017-11-23 DIAGNOSIS — C7970 Secondary malignant neoplasm of unspecified adrenal gland: Secondary | ICD-10-CM | POA: Diagnosis not present

## 2017-11-23 DIAGNOSIS — C787 Secondary malignant neoplasm of liver and intrahepatic bile duct: Secondary | ICD-10-CM | POA: Diagnosis not present

## 2017-11-24 DIAGNOSIS — Z8739 Personal history of other diseases of the musculoskeletal system and connective tissue: Secondary | ICD-10-CM | POA: Diagnosis not present

## 2017-11-24 DIAGNOSIS — Z8719 Personal history of other diseases of the digestive system: Secondary | ICD-10-CM | POA: Diagnosis not present

## 2017-11-24 DIAGNOSIS — C787 Secondary malignant neoplasm of liver and intrahepatic bile duct: Secondary | ICD-10-CM | POA: Diagnosis not present

## 2017-11-24 DIAGNOSIS — Z8679 Personal history of other diseases of the circulatory system: Secondary | ICD-10-CM | POA: Diagnosis not present

## 2017-11-24 DIAGNOSIS — C7970 Secondary malignant neoplasm of unspecified adrenal gland: Secondary | ICD-10-CM | POA: Diagnosis not present

## 2017-11-24 DIAGNOSIS — Z8639 Personal history of other endocrine, nutritional and metabolic disease: Secondary | ICD-10-CM | POA: Diagnosis not present

## 2017-11-24 DIAGNOSIS — Z87440 Personal history of urinary (tract) infections: Secondary | ICD-10-CM | POA: Diagnosis not present

## 2017-11-24 DIAGNOSIS — C3490 Malignant neoplasm of unspecified part of unspecified bronchus or lung: Secondary | ICD-10-CM | POA: Diagnosis not present

## 2017-12-15 DEATH — deceased

## 2021-05-14 ENCOUNTER — Encounter: Payer: Self-pay | Admitting: Internal Medicine

## 2022-02-13 ENCOUNTER — Encounter: Payer: Self-pay | Admitting: Internal Medicine

## 2022-04-16 ENCOUNTER — Other Ambulatory Visit: Payer: Self-pay | Admitting: Nurse Practitioner
